# Patient Record
Sex: Male | Born: 1965 | Race: Black or African American | Hispanic: No | Smoking: Former smoker
Health system: Southern US, Community
[De-identification: ages and names within clinical notes are randomized; demographics above are authoritative.]

## PROBLEM LIST (undated history)

## (undated) DIAGNOSIS — L853 Xerosis cutis: Secondary | ICD-10-CM

## (undated) DIAGNOSIS — I10 Essential (primary) hypertension: Secondary | ICD-10-CM

## (undated) DIAGNOSIS — M1990 Unspecified osteoarthritis, unspecified site: Secondary | ICD-10-CM

## (undated) DIAGNOSIS — M5430 Sciatica, unspecified side: Secondary | ICD-10-CM

## (undated) HISTORY — PX: OTHER SURGICAL HISTORY: SHX169

## (undated) HISTORY — PX: NO PAST SURGERIES: SHX2092

---

## 2000-08-08 ENCOUNTER — Encounter: Admission: RE | Admit: 2000-08-08 | Discharge: 2000-11-06 | Payer: Self-pay | Admitting: Emergency Medicine

## 2001-03-04 ENCOUNTER — Ambulatory Visit (HOSPITAL_BASED_OUTPATIENT_CLINIC_OR_DEPARTMENT_OTHER): Admission: RE | Admit: 2001-03-04 | Discharge: 2001-03-04 | Payer: Self-pay | Admitting: Pulmonary Disease

## 2007-09-07 ENCOUNTER — Emergency Department (HOSPITAL_COMMUNITY): Admission: EM | Admit: 2007-09-07 | Discharge: 2007-09-07 | Payer: Self-pay | Admitting: Emergency Medicine

## 2007-09-07 IMAGING — CR DG CHEST 1V PORT
1 series · 1 of 1 positions shown · non-contrast
Comparison: none

CLINICAL DATA: Syncope.  Smoking history.  
 PORTABLE CHEST- 1 VIEW ([SS] hours):
 The lungs are clear.  The heart is within normal limits and size.  No bony abnormality is seen.

[view not recorded]
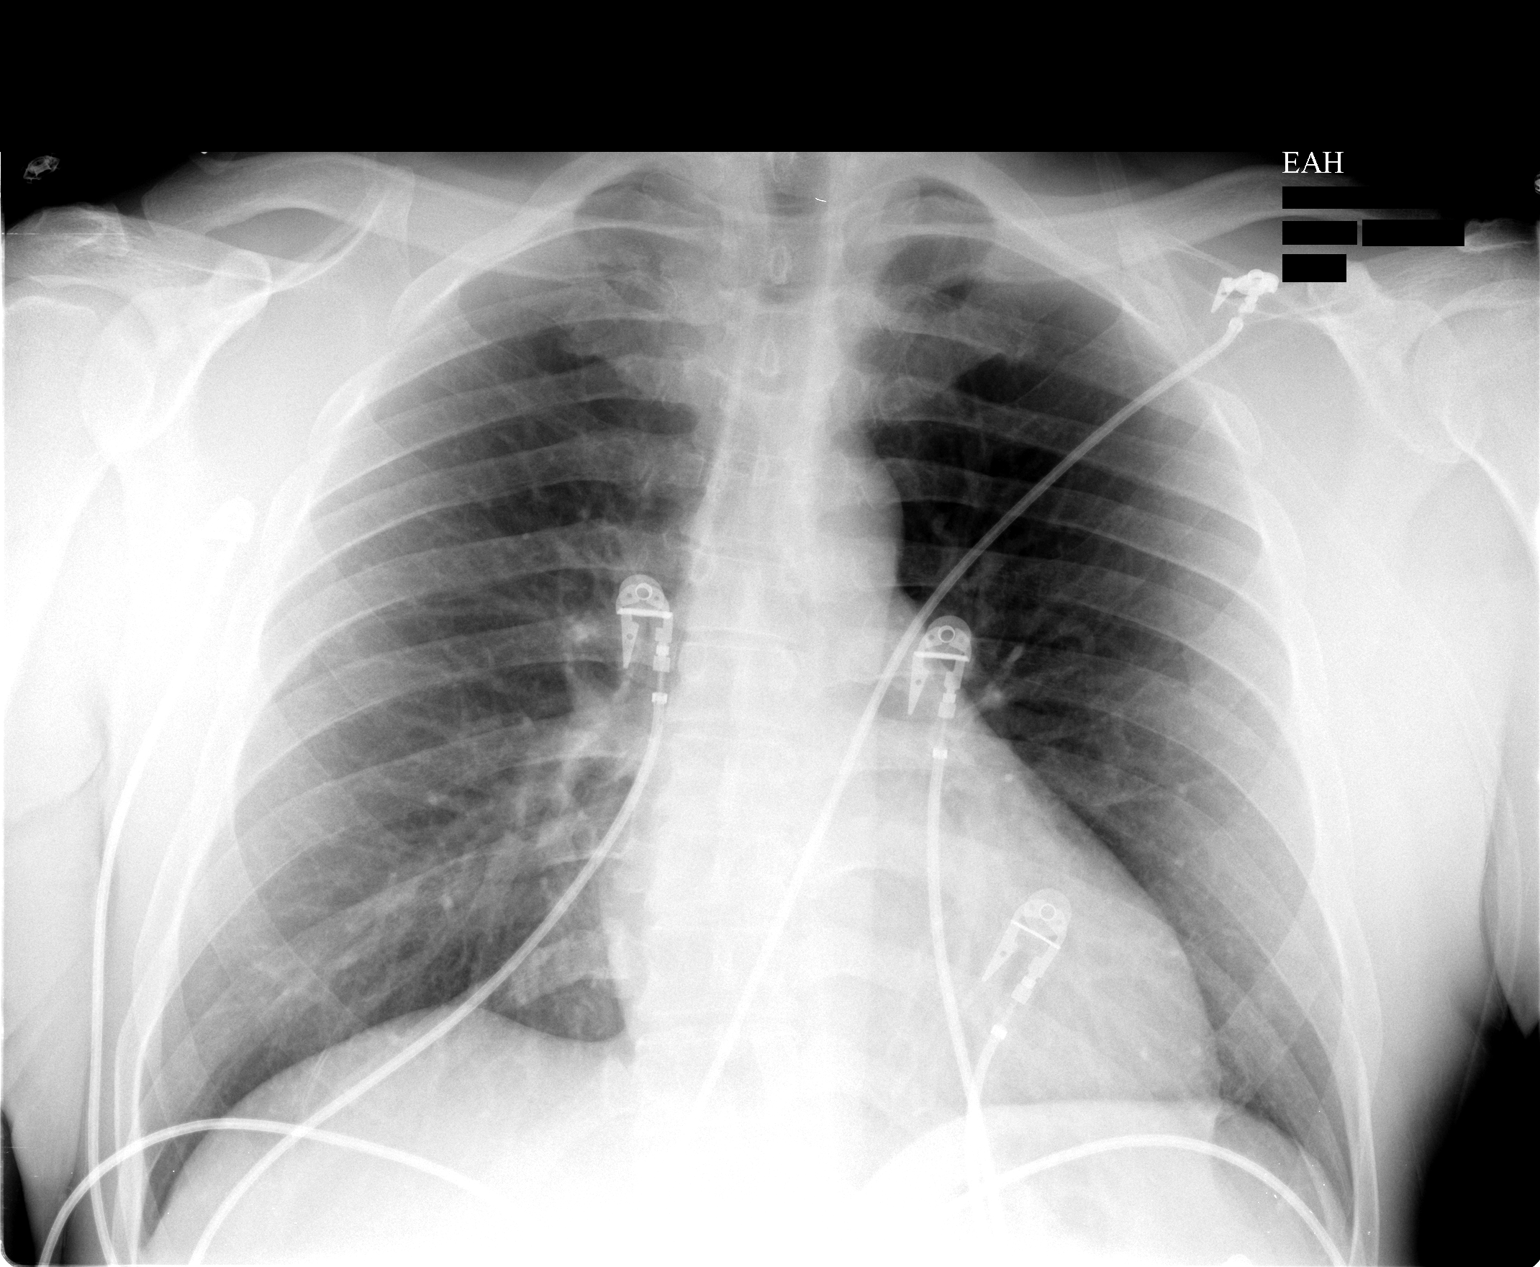

[1 of 1 positions shown; findings below may reference images not displayed]

IMPRESSION: No active lung disease.

## 2011-10-06 LAB — DIFFERENTIAL
Eosinophils Absolute: 0.1
Eosinophils Relative: 1
Lymphocytes Relative: 15
Lymphs Abs: 1.6
Monocytes Relative: 5
Neutrophils Relative %: 78 — ABNORMAL HIGH

## 2011-10-06 LAB — COMPREHENSIVE METABOLIC PANEL
Albumin: 3.6
Alkaline Phosphatase: 44
BUN: 11
CO2: 29
Chloride: 106
Creatinine, Ser: 0.88
Glucose, Bld: 86
Potassium: 4.3

## 2011-10-06 LAB — CBC
HCT: 41.3
Hemoglobin: 13.5
MCV: 85.5
Platelets: 173
RBC: 4.82
WBC: 11 — ABNORMAL HIGH

## 2011-10-06 LAB — POCT CARDIAC MARKERS
CKMB, poc: <1 — ABNORMAL LOW
CKMB, poc: <1 — ABNORMAL LOW
Myoglobin, poc: 78.4
Troponin i, poc: <0.05

## 2011-10-06 LAB — URINALYSIS, ROUTINE W REFLEX MICROSCOPIC
Glucose, UA: NEGATIVE
Ketones, ur: NEGATIVE
Specific Gravity, Urine: 1.02
pH: 7

## 2020-03-30 ENCOUNTER — Other Ambulatory Visit: Payer: Self-pay | Admitting: Surgery

## 2020-03-30 ENCOUNTER — Ambulatory Visit: Payer: Self-pay | Admitting: Surgery

## 2020-03-30 DIAGNOSIS — M7989 Other specified soft tissue disorders: Secondary | ICD-10-CM | POA: Insufficient documentation

## 2020-03-30 NOTE — H&P (Signed)
Meade Maw Documented: 03/30/2020 8:48 AM Location: [facility] Patient #: [phone] DOB: 03/20/1966 Single / Language: Cleophus Molt / Race: Black or African American Male  History of Present Illness Adin Hector MD; 03/30/2020 9:27 AM) The patient is a 54 year old male who presents with a soft tissue mass. Note for "Soft tissue mass": ` ` ` Patient sent for surgical consultation at the request of Dr Nancy Fetter  Chief Complaint: Left shoulder mass ` ` The patient is a pleasant active male who has had a mass over his left shoulder for many decades. However he's notices Larger. Some discomfort when he uses a shoulder. Does light duty activity. Sometimes moderate. Because it had gotten larger concerned him and he wished to be evaluated. Primary care physician recommended surgical consultation. Patient is obese but active. Denies any lumps or bumps elsewhere. He does smoke about a pack of cigarettes a day for the past 25 years. No by diabetic issues. He can walk a half hour without difficulty. No heart issues.  (Review of systems as stated in this history (HPI) or in the review of systems. Otherwise all other 12 point ROS are negative) ` ` `  This patient encounter took 25 minutes today to perform the following: obtain history, perform exam, review outside records, interpret tests & imaging, counsel the patient on their diagnosis; and, document this encounter, including findings & plan in the electronic health record (EHR).   Past Surgical History Emeline Gins, [place]; 03/30/2020 8:48 AM) No pertinent past surgical history  Diagnostic Studies History Emeline Gins, [place]; 03/30/2020 8:48 AM) Colonoscopy 1-5 years ago  Allergies Emeline Gins, [name]; 03/30/2020 8:49 AM) No Known Drug Allergies [03/30/2020]: Allergies Reconciled  Medication History Emeline Gins, [name]; 03/30/2020 8:50 AM) Lisinopril-hydroCHLOROthiazide (20-12.5MG  Tablet, Oral) Active. Meloxicam  (15MG  Tablet, Oral) Active. amLODIPine Besylate (5MG  Tablet, Oral) Active. Medications Reconciled  Social History Emeline Gins, [place]; 03/30/2020 8:48 AM) Alcohol use Occasional alcohol use. Caffeine use Carbonated beverages, Coffee, Tea. No drug use Tobacco use Current every day smoker.  Family History Emeline Gins, [place]; 03/30/2020 8:48 AM) Arthritis Mother, Sister. Diabetes Mellitus Brother, Mother. Hypertension Brother, Mother. Respiratory Condition Father.  Other Problems Emeline Gins, [name]; 03/30/2020 8:48 AM) Arthritis High blood pressure Hypercholesterolemia     Review of Systems Emeline Gins [name]; 03/30/2020 8:48 AM) General Not Present- Appetite Loss, Chills, Fatigue, Fever, Night Sweats, Weight Gain and Weight Loss. Skin Present- Dryness. Not Present- Change in Wart/Mole, Hives, Jaundice, New Lesions, Non-Healing Wounds, Rash and Ulcer. HEENT Present- Seasonal Allergies and Wears glasses/contact lenses. Not Present- Earache, Hearing Loss, Hoarseness, Nose Bleed, Oral Ulcers, Ringing in the Ears, Sinus Pain, Sore Throat, Visual Disturbances and Yellow Eyes. Respiratory Present- Snoring. Not Present- Bloody sputum, Chronic Cough, Difficulty Breathing and Wheezing. Cardiovascular Not Present- Chest Pain, Difficulty Breathing Lying Down, Leg Cramps, Palpitations, Rapid Heart Rate, Shortness of Breath and Swelling of Extremities. Gastrointestinal Not Present- Abdominal Pain, Bloating, Bloody Stool, Change in Bowel Habits, Chronic diarrhea, Constipation, Difficulty Swallowing, Excessive gas, Gets full quickly at meals, Hemorrhoids, Indigestion, Nausea, Rectal Pain and Vomiting. Male Genitourinary Not Present- Blood in Urine, Change in Urinary Stream, Frequency, Impotence, Nocturia, Painful Urination, Urgency and Urine Leakage. Musculoskeletal Present- Joint Pain and Joint Stiffness. Not Present- Back Pain, Muscle Pain, Muscle Weakness and Swelling of  Extremities. Neurological Not Present- Decreased Memory, Fainting, Headaches, Numbness, Seizures, Tingling, Tremor, Trouble walking and Weakness. Psychiatric Not Present- Anxiety, Bipolar, Change in Sleep Pattern, Depression, Fearful and Frequent crying. Endocrine Not Present- Cold Intolerance,  Excessive Hunger, Hair Changes, Heat Intolerance, Hot flashes and New Diabetes. Hematology Not Present- Blood Thinners, Easy Bruising, Excessive bleeding, Gland problems, HIV and Persistent Infections.  Vitals [name] [name]; 03/30/2020 8:49 AM) 03/30/2020 8:49 AM Weight: 244.6 lb Height: 68in Body Surface Area: 2.23 m Body Mass Index: 37.19 kg/m  Temp.: 98.72F  Pulse: 99 (Regular)  BP: 156/100 (Sitting, Left Arm, Standard)        Physical Exam Ardeth Sportsman MD; 03/30/2020 9:26 AM)  General Mental Status-Alert. General Appearance-Not in acute distress, Not Sickly. Orientation-Oriented X3. Hydration-Well hydrated. Voice-Normal.  Integumentary Global Assessment Upon inspection and palpation of skin surfaces of the - Axillae: non-tender, no inflammation or ulceration, no drainage. and Distribution of scalp and body hair is normal. General Characteristics Temperature - normal warmth is noted.  Head and Neck Head-normocephalic, atraumatic with no lesions or palpable masses. Face Global Assessment - atraumatic, no absence of expression. Neck Global Assessment - no abnormal movements, no bruit auscultated on the right, no bruit auscultated on the left, no decreased range of motion, non-tender. Trachea-midline. Thyroid Gland Characteristics - non-tender.  Eye Eyeball - Left-Extraocular movements intact, No Nystagmus - Left. Eyeball - Right-Extraocular movements intact, No Nystagmus - Right. Cornea - Left-No Hazy - Left. Cornea - Right-No Hazy - Right. Sclera/Conjunctiva - Left-No scleral icterus, No Discharge - Left. Sclera/Conjunctiva -  Right-No scleral icterus, No Discharge - Right. Pupil - Left-Direct reaction to light normal. Pupil - Right-Direct reaction to light normal.  ENMT Ears Pinna - Left - no drainage observed, no generalized tenderness observed. Pinna - Right - no drainage observed, no generalized tenderness observed. Nose and Sinuses External Inspection of the Nose - no destructive lesion observed. Inspection of the nares - Left - quiet respiration. Inspection of the nares - Right - quiet respiration. Mouth and Throat Lips - Upper Lip - no fissures observed, no pallor noted. Lower Lip - no fissures observed, no pallor noted. Nasopharynx - no discharge present. Oral Cavity/Oropharynx - Tongue - no dryness observed. Oral Mucosa - no cyanosis observed. Hypopharynx - no evidence of airway distress observed.  Chest and Lung Exam Inspection Movements - Normal and Symmetrical. Accessory muscles - No use of accessory muscles in breathing. Palpation Palpation of the chest reveals - Non-tender. Auscultation Breath sounds - Normal and Clear.  Cardiovascular Auscultation Rhythm - Regular. Murmurs & Other Heart Sounds - Auscultation of the heart reveals - No Murmurs and No Systolic Clicks.  Abdomen Inspection Inspection of the abdomen reveals - No Visible peristalsis and No Abnormal pulsations. Umbilicus - No Bleeding, No Urine drainage. Palpation/Percussion Palpation and Percussion of the abdomen reveal - Soft, Non Tender, No Rebound tenderness, No Rigidity (guarding) and No Cutaneous hyperesthesia. Note: Abdomen obese with soft. Small umbilical hernia through stalk only. Nontender. Not distended. No incisional hernias. No guarding.  Male Genitourinary Sexual Maturity Tanner 5 - Adult hair pattern and Adult penile size and shape. Note: No inguinal hernias. Normal external genitalia. Epididymi, testes, and spermatic cords normal without any masses.  Peripheral Vascular Upper Extremity Inspection -  Left - No Cyanotic nailbeds - Left, Not Ischemic. Inspection - Right - No Cyanotic nailbeds - Right, Not Ischemic.  Neurologic Neurologic evaluation reveals -normal attention span and ability to concentrate, able to name objects and repeat phrases. Appropriate fund of knowledge , normal sensation and normal coordination. Mental Status Affect - not angry, not paranoid. Cranial Nerves-Normal Bilaterally. Gait-Normal.  Neuropsychiatric Mental status exam performed with findings of-able to articulate well with normal  speech/language, rate, volume and coherence, thought content normal with ability to perform basic computations and apply abstract reasoning and no evidence of hallucinations, delusions, obsessions or homicidal/suicidal ideation.  Musculoskeletal Global Assessment Spine, Ribs and Pelvis - no instability, subluxation or laxity. Right Upper Extremity - no instability, subluxation or laxity. Note: Ellipsoid mass on in her left shoulder at apex overlying trapezius. 5.5 cm in largest diameter. Some vascular changes. Mildly fixed but mostly mobile and smooth and soft.  Lymphatic Head & Neck  General Head & Neck Lymphatics: Bilateral - Description - No Localized lymphadenopathy. Axillary  General Axillary Region: Bilateral - Description - No Localized lymphadenopathy. Femoral & Inguinal  Generalized Femoral & Inguinal Lymphatics: Left - Description - No Localized lymphadenopathy. Right - Description - No Localized lymphadenopathy.    Assessment & Plan Ardeth Sportsman MD; 03/30/2020 9:27 AM)  MASS OF SOFT TISSUE OF UPPER EXTREMITY (M79.89) Impression: Mass of left superior shoulder muscle along the trapezius growing in size. Seems ellipsoid and relatively soft. Most likely lipoma but some vascular changes maybe one or if it's more of a angiolipoma.  Because it has gotten larger, think it is reasonable to remove. It seems rather well circumscribed, solid don't think an MRI  is needed at this time. He is interested in proceeding. Might need a drain postop. We will see.  I strongly encouraged him to quit smoking.  Current Plans You are being scheduled for surgery- Our schedulers will call you.  You should hear from our office's scheduling department within 5 working days about the location, date, and time of surgery. We try to make accommodations for patient's preferences in scheduling surgery, but sometimes the OR schedule or the surgeon's schedule prevents [place] from making those accommodations.  If you have not heard from our office [phone]) in 5 working days, call the office and ask for your surgeon's nurse.  If you have other questions about your diagnosis, plan, or surgery, call the office and ask for your surgeon's nurse.  The pathophysiology of skin & subcutaneous masses was discussed. Natural history risks without surgery were discussed. I recommended surgery to remove the mass. I explained the technique of removal with use of local anesthesia & possible need for more aggressive sedation/anesthesia for patient comfort.  Risks such as bleeding, infection, wound breakdown, heart attack, death, and other risks were discussed. I noted a good likelihood this will help address the problem. Possibility that this will not correct all symptoms was explained. Possibility of regrowth/recurrence of the mass was discussed. We will work to minimize complications. Questions were answered. The patient expresses understanding & wishes to proceed with surgery.   TOBACCO ABUSE (Z72.0) Impression: STOP SMOKING!  We talked to the patient about the dangers of smoking. We stressed that tobacco use dramatically increases the risk of peri-operative complications such as infection, tissue necrosis leaving to problems with incision/wound and organ healing, hernia, chronic pain, heart attack, stroke, DVT, pulmonary embolism, and death. We noted there are programs in our  community to help stop smoking. Information was available.  Current Plans Pt Education - [facility] STOP SMOKING!  OBESITY (BMI 30-39.9) (E66.9)   UMBILICAL HERNIA WITHOUT OBSTRUCTION AND WITHOUT GANGRENE (K42.9) Impression: Very small umbilical hernia. Asymptomatic. Most likely present there for a long period time. Would not fix unless bothers or changes  Ardeth Sportsman, MD, FACS, MASCRS Gastrointestinal and Minimally Invasive Surgery  [facility] [address] [place], [place] [address] [phone] Main / Paging [phone])  [phone] Fax

## 2020-06-08 ENCOUNTER — Other Ambulatory Visit (HOSPITAL_COMMUNITY)
Admission: RE | Admit: 2020-06-08 | Discharge: 2020-06-08 | Disposition: A | Discharge status: Home / Self Care | Payer: HRSA Program | Source: Ambulatory Visit | Attending: Surgery | Admitting: Surgery

## 2020-06-08 ENCOUNTER — Encounter (HOSPITAL_BASED_OUTPATIENT_CLINIC_OR_DEPARTMENT_OTHER): Payer: Self-pay | Admitting: Surgery

## 2020-06-08 ENCOUNTER — Other Ambulatory Visit: Payer: Self-pay

## 2020-06-08 DIAGNOSIS — Z01812 Encounter for preprocedural laboratory examination: Secondary | ICD-10-CM | POA: Insufficient documentation

## 2020-06-08 DIAGNOSIS — Z20822 Contact with and (suspected) exposure to covid-19: Secondary | ICD-10-CM | POA: Diagnosis not present

## 2020-06-08 LAB — SARS CORONAVIRUS 2 (TAT 6-24 HRS): SARS Coronavirus 2: NEGATIVE

## 2020-06-08 NOTE — Progress Notes (Signed)
Spoke w/ via phone for pre-op interview---pt Lab needs dos----I stat 8, ekg              COVID test ------05-08-2020 at 1500 Arrive at -------530 am 06-11-2020 NPO after ------midnight Medications to take morning of surgery -----none Diabetic medication -----n/a Patient Special Instructions -----n/a Pre-Op special Istructions -----n/a Patient verbalized understanding of instructions that were given at this phone interview. Patient denies shortness of breath, chest pain, fever, cough a this phone interview.

## 2020-06-10 MED ORDER — BUPIVACAINE LIPOSOME 1.3 % IJ SUSP: 20.0000 mL | Freq: Once | INTRAMUSCULAR | Status: DC

## 2020-06-10 NOTE — Anesthesia Preprocedure Evaluation (Addendum)
Anesthesia Evaluation  Patient identified by MRN, date of birth, ID band Patient awake    Reviewed: Allergy & Precautions, NPO status   Airway Mallampati: II  TM Distance: >3 FB     Dental   Pulmonary Current Smoker and Patient abstained from smoking.,    breath sounds clear to auscultation       Cardiovascular hypertension,  Rhythm:Regular Rate:Normal     Neuro/Psych    GI/Hepatic negative GI ROS, Neg liver ROS,   Endo/Other    Renal/GU negative Renal ROS     Musculoskeletal  (+) Arthritis ,   Abdominal   Peds  Hematology   Anesthesia Other Findings   Reproductive/Obstetrics                            Anesthesia Physical Anesthesia Plan  ASA: III  Anesthesia Plan: MAC   Post-op Pain Management:    Induction: Intravenous  PONV Risk Score and Plan: 2 and Ondansetron, Dexamethasone and Midazolam  Airway Management Planned: Nasal Cannula  Additional Equipment:   Intra-op Plan:   Post-operative Plan:   Informed Consent:     Dental advisory given  Plan Discussed with: CRNA and Anesthesiologist  Anesthesia Plan Comments:        Anesthesia Quick Evaluation

## 2020-06-11 ENCOUNTER — Other Ambulatory Visit: Payer: Self-pay

## 2020-06-11 ENCOUNTER — Ambulatory Visit (HOSPITAL_BASED_OUTPATIENT_CLINIC_OR_DEPARTMENT_OTHER): Payer: Self-pay | Admitting: Anesthesiology

## 2020-06-11 ENCOUNTER — Ambulatory Visit (HOSPITAL_BASED_OUTPATIENT_CLINIC_OR_DEPARTMENT_OTHER)
Admission: RE | Admit: 2020-06-11 | Discharge: 2020-06-11 | Disposition: A | Discharge status: Home / Self Care | Payer: Self-pay | Attending: Surgery | Admitting: Surgery

## 2020-06-11 ENCOUNTER — Encounter (HOSPITAL_BASED_OUTPATIENT_CLINIC_OR_DEPARTMENT_OTHER)
Admission: RE | Disposition: A | Discharge status: Home / Self Care | Payer: Self-pay | Source: Home / Self Care | Attending: Surgery

## 2020-06-11 ENCOUNTER — Encounter (HOSPITAL_BASED_OUTPATIENT_CLINIC_OR_DEPARTMENT_OTHER): Payer: Self-pay | Admitting: Surgery

## 2020-06-11 DIAGNOSIS — F17210 Nicotine dependence, cigarettes, uncomplicated: Secondary | ICD-10-CM | POA: Insufficient documentation

## 2020-06-11 DIAGNOSIS — M1990 Unspecified osteoarthritis, unspecified site: Secondary | ICD-10-CM | POA: Insufficient documentation

## 2020-06-11 DIAGNOSIS — Z6837 Body mass index (BMI) 37.0-37.9, adult: Secondary | ICD-10-CM | POA: Insufficient documentation

## 2020-06-11 DIAGNOSIS — Z833 Family history of diabetes mellitus: Secondary | ICD-10-CM | POA: Insufficient documentation

## 2020-06-11 DIAGNOSIS — E7800 Pure hypercholesterolemia, unspecified: Secondary | ICD-10-CM | POA: Insufficient documentation

## 2020-06-11 DIAGNOSIS — Z79899 Other long term (current) drug therapy: Secondary | ICD-10-CM | POA: Insufficient documentation

## 2020-06-11 DIAGNOSIS — Z8249 Family history of ischemic heart disease and other diseases of the circulatory system: Secondary | ICD-10-CM | POA: Insufficient documentation

## 2020-06-11 DIAGNOSIS — E669 Obesity, unspecified: Secondary | ICD-10-CM | POA: Insufficient documentation

## 2020-06-11 DIAGNOSIS — R2232 Localized swelling, mass and lump, left upper limb: Secondary | ICD-10-CM | POA: Insufficient documentation

## 2020-06-11 HISTORY — DX: Xerosis cutis: L85.3

## 2020-06-11 HISTORY — DX: Essential (primary) hypertension: I10

## 2020-06-11 HISTORY — DX: Unspecified osteoarthritis, unspecified site: M19.90

## 2020-06-11 HISTORY — DX: Sciatica, unspecified side: M54.30

## 2020-06-11 HISTORY — PX: MASS EXCISION: SHX2000

## 2020-06-11 LAB — POCT I-STAT, CHEM 8
BUN: 13 mg/dL (ref 6–20)
Calcium, Ion: 1.26 mmol/L (ref 1.15–1.40)
Chloride: 104 mmol/L (ref 98–111)
Creatinine, Ser: 1.1 mg/dL (ref 0.61–1.24)
Glucose, Bld: 102 mg/dL — ABNORMAL HIGH (ref 70–99)
HCT: 43 % (ref 39.0–52.0)
Hemoglobin: 14.6 g/dL (ref 13.0–17.0)
Potassium: 4 mmol/L (ref 3.5–5.1)
Sodium: 141 mmol/L (ref 135–145)
TCO2: 24 mmol/L (ref 22–32)

## 2020-06-11 SURGERY — EXCISION MASS
Anesthesia: Monitor Anesthesia Care | Laterality: Left

## 2020-06-11 MED ORDER — ACETAMINOPHEN 500 MG PO TABS
1000.0000 mg | ORAL_TABLET | ORAL | Status: AC
  Administered 2020-06-11: 1000 mg via ORAL

## 2020-06-11 MED ORDER — PROPOFOL 10 MG/ML IV BOLUS
INTRAVENOUS | Status: AC
  Filled 2020-06-11: qty 20

## 2020-06-11 MED ORDER — CEFAZOLIN SODIUM-DEXTROSE 2-4 GM/100ML-% IV SOLN
2.0000 g | INTRAVENOUS | Status: AC
  Administered 2020-06-11: 2 g via INTRAVENOUS

## 2020-06-11 MED ORDER — BUPIVACAINE LIPOSOME 1.3 % IJ SUSP
INTRAMUSCULAR | Status: DC | PRN
  Administered 2020-06-11: 20 mL

## 2020-06-11 MED ORDER — PROPOFOL 500 MG/50ML IV EMUL
INTRAVENOUS | Status: AC
  Filled 2020-06-11: qty 50

## 2020-06-11 MED ORDER — MIDAZOLAM HCL 5 MG/5ML IJ SOLN
INTRAMUSCULAR | Status: DC | PRN
  Administered 2020-06-11: 2 mg via INTRAVENOUS

## 2020-06-11 MED ORDER — HYDROMORPHONE HCL 1 MG/ML IJ SOLN
INTRAMUSCULAR | Status: AC
  Filled 2020-06-11: qty 1

## 2020-06-11 MED ORDER — CEFAZOLIN SODIUM-DEXTROSE 2-4 GM/100ML-% IV SOLN
INTRAVENOUS | Status: AC
  Filled 2020-06-11: qty 100

## 2020-06-11 MED ORDER — PROPOFOL 500 MG/50ML IV EMUL
INTRAVENOUS | Status: DC | PRN
  Administered 2020-06-11: 75 ug/kg/min via INTRAVENOUS

## 2020-06-11 MED ORDER — CEFTRIAXONE SODIUM 2 G IJ SOLR
INTRAMUSCULAR | Status: AC
  Filled 2020-06-11: qty 20

## 2020-06-11 MED ORDER — CHLORHEXIDINE GLUCONATE CLOTH 2 % EX PADS: 6.0000 | MEDICATED_PAD | Freq: Once | CUTANEOUS | Status: DC

## 2020-06-11 MED ORDER — CELECOXIB 200 MG PO CAPS
ORAL_CAPSULE | ORAL | Status: AC
  Filled 2020-06-11: qty 2

## 2020-06-11 MED ORDER — METRONIDAZOLE IN NACL 5-0.79 MG/ML-% IV SOLN
500.0000 mg | INTRAVENOUS | Status: AC
  Administered 2020-06-11: 500 mg via INTRAVENOUS

## 2020-06-11 MED ORDER — MIDAZOLAM HCL 2 MG/2ML IJ SOLN
INTRAMUSCULAR | Status: AC
  Filled 2020-06-11: qty 2

## 2020-06-11 MED ORDER — BUPIVACAINE-EPINEPHRINE 0.25% -1:200000 IJ SOLN
INTRAMUSCULAR | Status: DC | PRN
  Administered 2020-06-11: 30 mL

## 2020-06-11 MED ORDER — GABAPENTIN 300 MG PO CAPS
300.0000 mg | ORAL_CAPSULE | ORAL | Status: AC
  Administered 2020-06-11: 300 mg via ORAL

## 2020-06-11 MED ORDER — ONDANSETRON HCL 4 MG/2ML IJ SOLN
INTRAMUSCULAR | Status: AC
  Filled 2020-06-11: qty 2

## 2020-06-11 MED ORDER — GABAPENTIN 300 MG PO CAPS
ORAL_CAPSULE | ORAL | Status: AC
  Filled 2020-06-11: qty 1

## 2020-06-11 MED ORDER — SODIUM CHLORIDE 0.9 % IV SOLN: INTRAVENOUS | Status: DC

## 2020-06-11 MED ORDER — TRAMADOL HCL 50 MG PO TABS: 50.0000 mg | ORAL_TABLET | Freq: Four times a day (QID) | ORAL | 0 refills | Status: DC | PRN

## 2020-06-11 MED ORDER — ACETAMINOPHEN 500 MG PO TABS
ORAL_TABLET | ORAL | Status: AC
  Filled 2020-06-11: qty 2

## 2020-06-11 MED ORDER — SODIUM CHLORIDE 0.9 % IV SOLN
INTRAVENOUS | Status: AC
  Filled 2020-06-11: qty 100

## 2020-06-11 MED ORDER — METRONIDAZOLE IN NACL 5-0.79 MG/ML-% IV SOLN
INTRAVENOUS | Status: AC
  Filled 2020-06-11: qty 100

## 2020-06-11 MED ORDER — CELECOXIB 200 MG PO CAPS
400.0000 mg | ORAL_CAPSULE | ORAL | Status: AC
  Administered 2020-06-11: 400 mg via ORAL

## 2020-06-11 MED ORDER — HYDROMORPHONE HCL 1 MG/ML IJ SOLN
0.2500 mg | INTRAMUSCULAR | Status: DC | PRN
  Administered 2020-06-11: 0.5 mg via INTRAVENOUS

## 2020-06-11 MED ORDER — SODIUM CHLORIDE 0.9 % IV SOLN
2.0000 g | INTRAVENOUS | Status: AC
  Administered 2020-06-11: 2 g via INTRAVENOUS

## 2020-06-11 MED ORDER — FENTANYL CITRATE (PF) 100 MCG/2ML IJ SOLN
INTRAMUSCULAR | Status: DC | PRN
  Administered 2020-06-11: 100 ug via INTRAVENOUS

## 2020-06-11 MED ORDER — LIDOCAINE 2% (20 MG/ML) 5 ML SYRINGE
INTRAMUSCULAR | Status: AC
  Filled 2020-06-11: qty 5

## 2020-06-11 MED ORDER — FENTANYL CITRATE (PF) 100 MCG/2ML IJ SOLN
INTRAMUSCULAR | Status: AC
  Filled 2020-06-11: qty 2

## 2020-06-11 MED ORDER — DEXAMETHASONE SODIUM PHOSPHATE 10 MG/ML IJ SOLN
INTRAMUSCULAR | Status: AC
  Filled 2020-06-11: qty 1

## 2020-06-11 SURGICAL SUPPLY — 57 items
APPLIER CLIP 11 MED OPEN (CLIP)
BENZOIN TINCTURE PRP APPL 2/3 (GAUZE/BANDAGES/DRESSINGS) ×2 IMPLANT
BLADE CLIPPER SENSICLIP SURGIC (BLADE) IMPLANT
BLADE HEX COATED 2.75 (ELECTRODE) ×2 IMPLANT
BLADE SURG 10 STRL SS (BLADE) ×2 IMPLANT
BLADE SURG 15 STRL LF DISP TIS (BLADE) ×1 IMPLANT
BLADE SURG 15 STRL SS (BLADE) ×2
BNDG GAUZE ELAST 4 BULKY (GAUZE/BANDAGES/DRESSINGS) IMPLANT
BRIEF STRETCH FOR OB PAD LRG (UNDERPADS AND DIAPERS) IMPLANT
CANISTER SUCT 1200ML W/VALVE (MISCELLANEOUS) IMPLANT
CHLORAPREP W/TINT 26 (MISCELLANEOUS) ×2 IMPLANT
CLIP APPLIE 11 MED OPEN (CLIP) IMPLANT
COVER BACK TABLE 60X90IN (DRAPES) ×2 IMPLANT
COVER MAYO STAND STRL (DRAPES) ×2 IMPLANT
COVER WAND RF STERILE (DRAPES) ×2 IMPLANT
DERMABOND ADVANCED (GAUZE/BANDAGES/DRESSINGS) ×1
DERMABOND ADVANCED .7 DNX12 (GAUZE/BANDAGES/DRESSINGS) ×1 IMPLANT
DRAIN CHANNEL 15F RND FF 3/16 (WOUND CARE) ×2 IMPLANT
DRAPE LAPAROTOMY 100X72 PEDS (DRAPES) ×2 IMPLANT
DRAPE ORTHO SPLIT 77X108 STRL (DRAPES)
DRAPE SURG ORHT 6 SPLT 77X108 (DRAPES) IMPLANT
DRAPE UTILITY XL STRL (DRAPES) ×2 IMPLANT
DRSG PAD ABDOMINAL 8X10 ST (GAUZE/BANDAGES/DRESSINGS) IMPLANT
DRSG TEGADERM 4X4.75 (GAUZE/BANDAGES/DRESSINGS) ×2 IMPLANT
ELECT REM PT RETURN 9FT ADLT (ELECTROSURGICAL) ×2
ELECTRODE REM PT RTRN 9FT ADLT (ELECTROSURGICAL) ×1 IMPLANT
EVACUATOR SILICONE 100CC (DRAIN) ×2 IMPLANT
GAUZE SPONGE 4X4 12PLY STRL (GAUZE/BANDAGES/DRESSINGS) ×2 IMPLANT
GLOVE ECLIPSE 8.0 STRL XLNG CF (GLOVE) ×2 IMPLANT
GLOVE INDICATOR 8.0 STRL GRN (GLOVE) ×2 IMPLANT
GOWN STRL REUS W/TWL LRG LVL3 (GOWN DISPOSABLE) ×2 IMPLANT
KIT TURNOVER CYSTO (KITS) ×2 IMPLANT
NEEDLE HYPO 22GX1.5 SAFETY (NEEDLE) ×2 IMPLANT
NS IRRIG 500ML POUR BTL (IV SOLUTION) ×2 IMPLANT
PENCIL BUTTON HOLSTER BLD 10FT (ELECTRODE) ×2 IMPLANT
SET BASIN DAY SURGERY F.S. (CUSTOM PROCEDURE TRAY) ×2 IMPLANT
SPONGE LAP 18X18 RF (DISPOSABLE) ×2 IMPLANT
SPONGE LAP 4X18 RFD (DISPOSABLE) IMPLANT
STOCKINETTE 6  STRL (DRAPES)
STOCKINETTE 6 STRL (DRAPES) IMPLANT
STRIP CLOSURE SKIN 1/2X4 (GAUZE/BANDAGES/DRESSINGS) ×2 IMPLANT
SUT MNCRL AB 4-0 PS2 18 (SUTURE) ×2 IMPLANT
SUT PROLENE 2 0 CT2 30 (SUTURE) ×2 IMPLANT
SUT VIC AB 2-0 SH 18 (SUTURE) ×2 IMPLANT
SUT VIC AB 3-0 SH 18 (SUTURE) IMPLANT
SUT VIC AB 3-0 SH 27 (SUTURE)
SUT VIC AB 3-0 SH 27X BRD (SUTURE) IMPLANT
SUT VICRYL 2 0 18  UND BR (SUTURE)
SUT VICRYL 2 0 18 UND BR (SUTURE) IMPLANT
SYR 20ML LL LF (SYRINGE) ×2 IMPLANT
SYR BULB IRRIG 60ML STRL (SYRINGE) ×2 IMPLANT
SYR CONTROL 10ML LL (SYRINGE) IMPLANT
TOWEL OR 17X26 10 PK STRL BLUE (TOWEL DISPOSABLE) ×4 IMPLANT
TRAY DSU PREP LF (CUSTOM PROCEDURE TRAY) IMPLANT
TUBE CONNECTING 12X1/4 (SUCTIONS) ×2 IMPLANT
WATER STERILE IRR 500ML POUR (IV SOLUTION) ×2 IMPLANT
YANKAUER SUCT BULB TIP NO VENT (SUCTIONS) ×2 IMPLANT

## 2020-06-11 NOTE — Anesthesia Procedure Notes (Signed)
Performed by: Grantley Savage D, CRNA Oxygen Delivery Method: Simple face mask       

## 2020-06-11 NOTE — Interval H&P Note (Signed)
History and Physical Interval Note:  06/11/2020 6:58 AM  Alan Coffey  has presented today for surgery, with the diagnosis of LEFT SHOULDER SUBCUTANEOUS MASS, 5.5 cm x 5 cm.  The various methods of treatment have been discussed with the patient and family. After consideration of risks, benefits and other options for treatment, the patient has consented to  Procedure(s) with comments: REMOVAL OF LEFT SHOULDER SUBCUTANEOUS MASS (Left) - MAC VS GENERAL (ANESTHESIA CHOICE) as a surgical intervention.  The patient's history has been reviewed, patient examined, no change in status, stable for surgery.  I have reviewed the patient's chart and labs.  Questions were answered to the patient's satisfaction.     Adin Hector

## 2020-06-11 NOTE — Anesthesia Postprocedure Evaluation (Signed)
Anesthesia Post Note  Patient: Alan Coffey  Procedure(s) Performed: REMOVAL OF LEFT SHOULDER SUBCUTANEOUS MASS (Left )     Patient location during evaluation: PACU Anesthesia Type: MAC Level of consciousness: awake Pain management: pain level controlled Vital Signs Assessment: post-procedure vital signs reviewed and stable Respiratory status: spontaneous breathing Cardiovascular status: stable Postop Assessment: no apparent nausea or vomiting Anesthetic complications: no   No complications documented.  Last Vitals:  Vitals:   06/11/20 0600 06/11/20 0854  BP: (!) 169/109   Pulse: 89 73  Resp: 20 16  Temp: 36.9 C 36.6 C  SpO2: 96% 97%    Last Pain:  Vitals:   06/11/20 0854  TempSrc: Oral  PainSc:                  [name]

## 2020-06-11 NOTE — Anesthesia Postprocedure Evaluation (Signed)
Anesthesia Post Note ° °Patient: Alan Coffey ° °Procedure(s) Performed: REMOVAL OF LEFT SHOULDER SUBCUTANEOUS MASS (Left ) ° °  ° °Patient location during evaluation: PACU °Anesthesia Type: MAC °Level of consciousness: awake °Pain management: pain level controlled °Vital Signs Assessment: post-procedure vital signs reviewed and stable °Respiratory status: spontaneous breathing °Cardiovascular status: stable °Postop Assessment: no apparent nausea or vomiting °Anesthetic complications: no ° ° °No complications documented. ° °Last Vitals:  °Vitals:  ° 06/11/20 0600 06/11/20 0854  °BP: (!) 169/109   °Pulse: 89 73  °Resp: 20 16  °Temp: 36.9 °C 36.6 °C  °SpO2: 96% 97%  °  °Last Pain:  °Vitals:  ° 06/11/20 0854  °TempSrc: Oral  °PainSc:   ° ° °  °  °  °  °  °  ° °Alan Coffey ° ° ° ° °

## 2020-06-11 NOTE — Op Note (Signed)
06/11/2020  8:54 AM  PATIENT:  Alan Coffey  54 y.o. male  Patient Care Team: Sandi Mariscal, MD as PCP - General (Internal Medicine) Michael Boston, MD as Consulting Physician (General Surgery)  PRE-OPERATIVE DIAGNOSIS:  LEFT SHOULDER SUBCUTANEOUS MASS, 5.5 cm x 5 cm  POST-OPERATIVE DIAGNOSIS:  LEFT SHOULDER SUBCUTANEOUS CYSTIC MASS, 7 cm x 6 cm  PROCEDURE:  REMOVAL OF LEFT SHOULDER SUBCUTANEOUS MASS  SURGEON:  Adin Hector, MD  ASSISTANT: OR Staff   ANESTHESIA:   local and MAC  EBL:  No intake/output data recorded..  See anesthesia record  Delay start of Pharmacological VTE agent (>24hrs) due to surgical blood loss or risk of bleeding:  no  DRAINS: 15Fr Blake drain from shoulder to left anterior chest   SPECIMEN:  LEFT SHOULDER CYSTIC MASS  DISPOSITION OF SPECIMEN:  PATHOLOGY  COUNTS:  YES  PLAN OF CARE: Discharge to home after PACU  PATIENT DISPOSITION:  PACU - hemodynamically stable.  INDICATION: Pleasant patient with enlarging mass on supraclavicular left side coming onto shoulder.  Becoming uncomfortable and enlarging.  Smooth.  Suspicion for malignancy low.  I offered excision the operating room  The pathophysiology of skin & subcutaneous masses was discussed.  Natural history risks without surgery were discussed.  I recommended surgery to remove the mass.  I explained the technique of removal with use of local anesthesia & possible need for more aggressive sedation/anesthesia for patient comfort.    Risks such as bleeding, infection, wound breakdown, heart attack, death, and other risks were discussed.  I noted a good likelihood this will help address the problem.   Possibility that this will not correct all symptoms was explained. Possibility of regrowth/recurrence of the mass was discussed.  We will work to minimize complications. Questions were answered.  The patient expresses understanding & wishes to proceed with surgery.  OR FINDINGS: 7 x 6 cm turgid  subcutaneous mass with dense adhesions to the dermis.  Primarily containing serosanguineous cystic fluid.  Inflamed but not infected.  Adhered to chest wall fascia and onto trapezius.  Excised.  Layered closure with drain  CASE DATA:  Type of patient?: Elective [facility] Private Case  Status of Case? Elective Scheduled  Infection Present At Time Of Surgery (PATOS)?  NO  DESCRIPTION:     Informed consent was confirmed.  Patient underwent monitored deep sedation.  Supine with head up beachchair status.  Shoulder and chest on left side prepped and draped in a sterile fashion.  Surgical timeout confirmed our plan.  I did aggressive field block with 1/4% bupivacaine plain mixed with Exparel.  I made a incision over the center of the part of the mass obliquely along natural tension lines.  Mass was densely adherent to the dermis.  I used sharp dissection to help free it off the dermis.  I ended up decompressing it.  Contain very thin serosanguineous fluid.  Wall was rather inflamed.  I was able to gradually come around to the anterior chest wall fascia and free off the deeper margin come around.  Extremely dense the skin and it was quite stretched out so I ended up removing some excess skin in a biconcave fusiform fashion.  Specimen removed intact.  Hemostasis ensured.  Because the moderate size decided to leave a drain.  Tunneled to come out the anterior axillary line in his upper inner axilla.  Secured with Prolene suture.  I mobilized skin flaps onto the left chest wall and left back.  I closed it  with interrupted 2-0 Vicryl deep sutures as well as deep dermal sutures.  Running 4-0 Monocryl subcuticular suture.  Dermabond placed.  Sterile dressing applied.  Patient tolerated procedure well.  Should be able to go home.  Plan would be for him to come back in about a week to remove the drain if output remains low and then follow-up thereafter.  .I discussed operative findings, updated the patient's status,  discussed probable steps to recovery, and gave postoperative recommendations to the patient's daughter, [name].  Recommendations were made.  Questions were answered.  She expressed understanding & appreciation.   Adin Hector, M.D., F.A.C.S. Gastrointestinal and Minimally Invasive Surgery [facility] [address] [place], [place] [address] [phone] Main / Paging

## 2020-06-11 NOTE — H&P (Signed)
06/11/2020   Alan Coffey DOB: 07-Dec-1966  Patient Care Team: [name], MD as PCP - General (Internal Medicine) [name], MD as Consulting Physician (General Surgery)   Patient sent for surgical consultation at the request of Dr Alan Coffey  Chief Complaint: Left shoulder mass ` ` The patient is a pleasant active male who has had a mass over his left shoulder for many decades. However he's notices Larger. Some discomfort when he uses a shoulder. Does light duty activity. Sometimes moderate. Because it had gotten larger concerned him and he wished to be evaluated. Primary care physician recommended surgical consultation. Patient is obese but active. Denies any lumps or bumps elsewhere. He does smoke about a pack of cigarettes a day for the past 25 years. No by diabetic issues. He can walk a half hour without difficulty. No heart issues.  (Review of systems as stated in this history (HPI) or in the review of systems. Otherwise all other 12 point ROS are negative) ` ` `  This patient encounter took 25 minutes today to perform the following: obtain history, perform exam, review outside records, interpret tests & imaging, counsel the patient on their diagnosis; and, document this encounter, including findings & plan in the electronic health record (EHR).   Past Surgical History [name], [name]; 03/30/2020 8:48 AM) No pertinent past surgical history  Diagnostic Studies History [name], [name]; 03/30/2020 8:48 AM) Colonoscopy 1-5 years ago  Allergies [name], [name]; 03/30/2020 8:49 AM) No Known Drug Allergies [03/30/2020]: Allergies Reconciled  Medication History [name], [name]; 03/30/2020 8:50 AM) Lisinopril-hydroCHLOROthiazide (20-12.5MG  Tablet, Oral) Active. Meloxicam (15MG  Tablet, Oral) Active. amLODIPine Besylate (5MG  Tablet, Oral) Active. Medications Reconciled  Social History , ; 03/30/2020 8:48 AM) Alcohol  use Occasional alcohol use. Caffeine use Carbonated beverages, Coffee, Tea. No drug use Tobacco use Current every day smoker.  Family History [name], 05/30/2020; 03/30/2020 8:48 AM) Arthritis Mother, Sister. Diabetes Mellitus Brother, Mother. Hypertension Brother, Mother. Respiratory Condition Father.  Other Problems [name], [name]; 03/30/2020 8:48 AM) Arthritis High blood pressure Hypercholesterolemia     Review of Systems [name] [name]; 03/30/2020 8:48 AM) General Not Present- Appetite Loss, Chills, Fatigue, Fever, Night Sweats, Weight Gain and Weight Loss. Skin Present- Dryness. Not Present- Change in Wart/Mole, Hives, Jaundice, New Lesions, Non-Healing Wounds, Rash and Ulcer. HEENT Present- Seasonal Allergies and Wears glasses/contact lenses. Not Present- Earache, Hearing Loss, Hoarseness, Nose Bleed, Oral Ulcers, Ringing in the Ears, Sinus Pain, Sore Throat, Visual Disturbances and Yellow Eyes. Respiratory Present- Snoring. Not Present- Bloody sputum, Chronic Cough, Difficulty Breathing and Wheezing. Cardiovascular Not Present- Chest Pain, Difficulty Breathing Lying Down, Leg Cramps, Palpitations, Rapid Heart Rate, Shortness of Breath and Swelling of Extremities. Gastrointestinal Not Present- Abdominal Pain, Bloating, Bloody Stool, Change in Bowel Habits, Chronic diarrhea, Constipation, Difficulty Swallowing, Excessive gas, Gets full quickly at meals, Hemorrhoids, Indigestion, Nausea, Rectal Pain and Vomiting. Male Genitourinary Not Present- Blood in Urine, Change in Urinary Stream, Frequency, Impotence, Nocturia, Painful Urination, Urgency and Urine Leakage. Musculoskeletal Present- Joint Pain and Joint Stiffness. Not Present- Back Pain, Muscle Pain, Muscle Weakness and Swelling of Extremities. Neurological Not Present- Decreased Memory, Fainting, Headaches, Numbness, Seizures, Tingling, Tremor, Trouble walking and Weakness. Psychiatric Not Present-  Anxiety, Bipolar, Change in Sleep Pattern, Depression, Fearful and Frequent crying. Endocrine Not Present- Cold Intolerance, Excessive Hunger, Hair Changes, Heat Intolerance, Hot flashes and New Diabetes. Hematology Not Present- Blood Thinners, Easy Bruising, Excessive bleeding, Gland problems, HIV and Persistent Infections.  Vitals [name] [name];  03/30/2020 8:49 AM) 03/30/2020 8:49 AM Weight: 244.6 lb Height: 68in Body Surface Area: 2.23 m Body Mass Index: 37.19 kg/m  Temp.: 98.32F  Pulse: 99 (Regular)  BP: 156/100 (Sitting, Left Arm, Standard)   BP (!) 169/109   Pulse 89   Temp 98.4 F (36.9 C) (Oral)   Resp 20   Ht 5\' 8"  (1.727 m)   Wt 112.1 kg   SpO2 96%   BMI 37.59 kg/m       Physical Exam Adin Hector MD; 03/30/2020 9:26 AM)  General Mental Status-Alert. General Appearance-Not in acute distress, Not Sickly. Orientation-Oriented X3. Hydration-Well hydrated. Voice-Normal.  Integumentary Global Assessment Upon inspection and palpation of skin surfaces of the - Axillae: non-tender, no inflammation or ulceration, no drainage. and Distribution of scalp and body hair is normal. General Characteristics Temperature - normal warmth is noted.  Head and Neck Head-normocephalic, atraumatic with no lesions or palpable masses. Face Global Assessment - atraumatic, no absence of expression. Neck Global Assessment - no abnormal movements, no bruit auscultated on the right, no bruit auscultated on the left, no decreased range of motion, non-tender. Trachea-midline. Thyroid Gland Characteristics - non-tender.  Eye Eyeball - Left-Extraocular movements intact, No Nystagmus - Left. Eyeball - Right-Extraocular movements intact, No Nystagmus - Right. Cornea - Left-No Hazy - Left. Cornea - Right-No Hazy - Right. Sclera/Conjunctiva - Left-No scleral icterus, No Discharge - Left. Sclera/Conjunctiva - Right-No scleral  icterus, No Discharge - Right. Pupil - Left-Direct reaction to light normal. Pupil - Right-Direct reaction to light normal.  ENMT Ears Pinna - Left - no drainage observed, no generalized tenderness observed. Pinna - Right - no drainage observed, no generalized tenderness observed. Nose and Sinuses External Inspection of the Nose - no destructive lesion observed. Inspection of the nares - Left - quiet respiration. Inspection of the nares - Right - quiet respiration. Mouth and Throat Lips - Upper Lip - no fissures observed, no pallor noted. Lower Lip - no fissures observed, no pallor noted. Nasopharynx - no discharge present. Oral Cavity/Oropharynx - Tongue - no dryness observed. Oral Mucosa - no cyanosis observed. Hypopharynx - no evidence of airway distress observed.  Chest and Lung Exam Inspection Movements - Normal and Symmetrical. Accessory muscles - No use of accessory muscles in breathing. Palpation Palpation of the chest reveals - Non-tender. Auscultation Breath sounds - Normal and Clear.  Cardiovascular Auscultation Rhythm - Regular. Murmurs & Other Heart Sounds - Auscultation of the heart reveals - No Murmurs and No Systolic Clicks.  Abdomen Inspection Inspection of the abdomen reveals - No Visible peristalsis and No Abnormal pulsations. Umbilicus - No Bleeding, No Urine drainage. Palpation/Percussion Palpation and Percussion of the abdomen reveal - Soft, Non Tender, No Rebound tenderness, No Rigidity (guarding) and No Cutaneous hyperesthesia. Note: Abdomen obese with soft. Small umbilical hernia through stalk only. Nontender. Not distended. No incisional hernias. No guarding.  Male Genitourinary Sexual Maturity Tanner 5 - Adult hair pattern and Adult penile size and shape. Note: No inguinal hernias. Normal external genitalia. Epididymi, testes, and spermatic cords normal without any masses.  Peripheral Vascular Upper Extremity Inspection - Left - No  Cyanotic nailbeds - Left, Not Ischemic. Inspection - Right - No Cyanotic nailbeds - Right, Not Ischemic.  Neurologic Neurologic evaluation reveals -normal attention span and ability to concentrate, able to name objects and repeat phrases. Appropriate fund of knowledge , normal sensation and normal coordination. Mental Status Affect - not angry, not paranoid. Cranial Nerves-Normal Bilaterally. Gait-Normal.  Neuropsychiatric Mental status  exam performed with findings of-able to articulate well with normal speech/language, rate, volume and coherence, thought content normal with ability to perform basic computations and apply abstract reasoning and no evidence of hallucinations, delusions, obsessions or homicidal/suicidal ideation.  Musculoskeletal Global Assessment Spine, Ribs and Pelvis - no instability, subluxation or laxity. Right Upper Extremity - no instability, subluxation or laxity. Note: Ellipsoid mass on left shoulder at apex overlying trapezius. 5.5 cm in largest diameter. Some vascular changes. Mildly fixed but mostly mobile and smooth and soft.  Lymphatic Head & Neck  General Head & Neck Lymphatics: Bilateral - Description - No Localized lymphadenopathy. Axillary  General Axillary Region: Bilateral - Description - No Localized lymphadenopathy. Femoral & Inguinal  Generalized Femoral & Inguinal Lymphatics: Left - Description - No Localized lymphadenopathy. Right - Description - No Localized lymphadenopathy.    Assessment & Plan  MASS OF SOFT TISSUE OF UPPER EXTREMITY (M79.89) Impression: Mass of left superior shoulder muscle along the trapezius growing in size. Seems ellipsoid and relatively soft. Most likely lipoma but some vascular changes maybe one or if it's more of a angiolipoma.  Because it has gotten larger, think it is reasonable to remove. It seems rather well circumscribed, solid don't think an MRI is needed at this time. He is interested in  proceeding.  Might need a drain postop. We will see.  The pathophysiology of skin & subcutaneous masses was discussed. Natural history risks without surgery were discussed. I recommended surgery to remove the mass. I explained the technique of removal with use of local anesthesia & possible need for more aggressive sedation/anesthesia for patient comfort.  Risks such as bleeding, infection, wound breakdown, heart attack, death, and other risks were discussed. I noted a good likelihood this will help address the problem. Possibility that this will not correct all symptoms was explained. Possibility of regrowth/recurrence of the mass was discussed. We will work to minimize complications. Questions were answered. The patient expresses understanding & wishes to proceed with surgery.    TOBACCO ABUSE (Z72.0) Impression: STOP SMOKING!  We talked to the patient about the dangers of smoking. We stressed that tobacco use dramatically increases the risk of peri-operative complications such as infection, tissue necrosis leaving to problems with incision/wound and organ healing, hernia, chronic pain, heart attack, stroke, DVT, pulmonary embolism, and death. We noted there are programs in our community to help stop smoking. Information was available.  Current Plans Pt Education - [facility] STOP SMOKING!  OBESITY (BMI 30-39.9) (E66.9)   UMBILICAL HERNIA WITHOUT OBSTRUCTION AND WITHOUT GANGRENE (K42.9) Impression: Very small umbilical hernia. Asymptomatic. Most likely present there for a long period time. Would not fix unless bothers or changes  Ardeth Sportsman, MD, FACS, MASCRS Gastrointestinal and Minimally Invasive Surgery  [facility] [address], [address] [place], [place] [address] [phone] Main / Paging [phone] Fax

## 2020-06-11 NOTE — Discharge Instructions (Signed)
Information for Discharge Teaching: EXPAREL (bupivacaine liposome injectable suspension)   Your surgeon gave you EXPAREL(bupivacaine) in your surgical incision to help control your pain after surgery.   EXPAREL is a local anesthetic that provides pain relief by numbing the tissue around the surgical site.  EXPAREL is designed to release pain medication over time and can control pain for up to 72 hours.  Depending on how you respond to EXPAREL, you may require less pain medication during your recovery.  Possible side effects:  Temporary loss of sensation or ability to move in the area where bupivacaine was injected.  Nausea, vomiting, constipation  Rarely, numbness and tingling in your mouth or lips, lightheadedness, or anxiety may occur.  Call your doctor right away if you think you may be experiencing any of these sensations, or if you have other questions regarding possible side effects.  Follow all other discharge instructions given to you by your surgeon or nurse. Eat a healthy diet and drink plenty of water or other fluids.  If you return to the hospital for any reason within 96 hours following the administration of EXPAREL, please inform your health care providers. Post Anesthesia Home Care Instructions  Activity: Get plenty of rest for the remainder of the day. A responsible adult should stay with you for 24 hours following the procedure.  For the next 24 hours, DO NOT: -Drive a car -Advertising copywriter -Drink alcoholic beverages -Take any medication unless instructed by your physician -Make any legal decisions or sign important papers.  Meals: Start with liquid foods such as gelatin or soup. Progress to regular foods as tolerated. Avoid greasy, spicy, heavy foods. If nausea and/or vomiting occur, drink only clear liquids until the nausea and/or vomiting subsides. Call your physician if vomiting continues.  Special Instructions/Symptoms: Your throat may feel dry or sore  from the anesthesia or the breathing tube placed in your throat during surgery. If this causes discomfort, gargle with warm salt water. The discomfort should disappear within 24 hours.  If you had a scopolamine patch placed behind your ear for the management of post- operative nausea and/or vomiting:  1. The medication in the patch is effective for 72 hours, after which it should be removed.  Wrap patch in a tissue and discard in the trash. Wash hands thoroughly with soap and water. 2. You may remove the patch earlier than 72 hours if you experience unpleasant side effects which may include dry mouth, dizziness or visual disturbances. 3. Avoid touching the patch. Wash your hands with soap and water after contact with the patch.   GENERAL SURGERY: POST OP INSTRUCTIONS  ######################################################################  EAT Gradually transition to a high fiber diet with a fiber supplement over the next few weeks after discharge.  Start with a pureed / full liquid diet (see below)  WALK Walk an hour a day.  Control your pain to do that.    CONTROL PAIN Control pain so that you can walk, sleep, tolerate sneezing/coughing, go up/down stairs.  HAVE A BOWEL MOVEMENT DAILY Keep your bowels regular to avoid problems.  OK to try a laxative to override constipation.  OK to use an antidairrheal to slow down diarrhea.  Call if not better after 2 tries  CALL IF YOU HAVE PROBLEMS/CONCERNS Call if you are still struggling despite following these instructions. Call if you have concerns not answered by these instructions  ######################################################################    1. DIET: Follow a light bland diet & liquids the first 24 hours after arrival home,  such as soup, liquids, starches, etc.  Be sure to drink plenty of fluids.  Quickly advance to a usual solid diet within a few days.  Avoid fast food or heavy meals as your are more likely to get nauseated or  have irregular bowels.  A low-fat, high-fiber diet for the rest of your life is ideal.   2. Take your usually prescribed home medications unless otherwise directed. 3. PAIN CONTROL: a. Pain is best controlled by a usual combination of three different methods TOGETHER: i. Ice/Heat ii. Over the counter pain medication iii. Prescription pain medication b. Most patients will experience some swelling and bruising around the incisions.  Ice packs or heating pads (30-60 minutes up to 6 times a day) will help. Use ice for the first few days to help decrease swelling and bruising, then switch to heat to help relax tight/sore spots and speed recovery.  Some people prefer to use ice alone, heat alone, alternating between ice & heat.  Experiment to what works for you.  Swelling and bruising can take several weeks to resolve.   c. It is helpful to take an over-the-counter pain medication regularly for the first few weeks.  Choose one of the following that works best for you: i. Naproxen (Aleve, etc)  Two 220mg  tabs twice a day ii. Ibuprofen (Advil, etc) Three 200mg  tabs four times a day (every meal & bedtime) iii. Acetaminophen (Tylenol, etc) 500-650mg  four times a day (every meal & bedtime) d. A  prescription for pain medication (such as oxycodone, hydrocodone, etc) should be given to you upon discharge.  Take your pain medication as prescribed.  i. If you are having problems/concerns with the prescription medicine (does not control pain, nausea, vomiting, rash, itching, etc), please call [phone] to see if we need to switch you to a different pain medicine that will work better for you and/or control your side effect better. ii. If you need a refill on your pain medication, please contact your pharmacy.  They will contact our office to request authorization. Prescriptions will not be filled after 5 pm or on week-ends. 4. Avoid getting constipated.  Between the surgery and the pain medications, it is  common to experience some constipation.  Increasing fluid intake and taking a fiber supplement (such as Metamucil, Citrucel, FiberCon, MiraLax, etc) 1-2 times a day regularly will usually help prevent this problem from occurring.  A mild laxative (prune juice, Milk of Magnesia, MiraLax, etc) should be taken according to package directions if there are no bowel movements after 48 hours.   5. Wash / shower every day.  You may shower over the dressings as they are waterproof.  Continue to shower over incision(s) after the dressing is off. 6. Remove your waterproof bandages 5 days after surgery.  You may leave the incision open to air.  You may have skin tapes (Steri Strips) covering the incision(s).  Leave them on until one week, then remove.  You may replace a dressing/Band-Aid to cover the incision for comfort if you wish.      7. ACTIVITIES as tolerated:   a. You may resume regular (light) daily activities beginning the next day--such as daily self-care, walking, climbing stairs--gradually increasing activities as tolerated.  If you can walk 30 minutes without difficulty, it is safe to try more intense activity such as jogging, treadmill, bicycling, low-impact aerobics, swimming, etc. b. Save the most intensive and strenuous activity for last such as sit-ups, heavy lifting, contact sports, etc  Refrain from any heavy lifting or straining until you are off narcotics for pain control.   c. DO NOT PUSH THROUGH PAIN.  Let pain be your guide: If it hurts to do something, don't do it.  Pain is your body warning you to avoid that activity for another week until the pain goes down. d. You may drive when you are no longer taking prescription pain medication, you can comfortably wear a seatbelt, and you can safely maneuver your car and apply brakes. e. Bonita Quin may have sexual intercourse when it is comfortable.  8. FOLLOW UP in our office a. Please call [facility] at [phone] to set up an appointment to see your  surgeon in the office for a follow-up appointment approximately 2-3 weeks after your surgery. b. Make sure that you call for this appointment the day you arrive home to insure a convenient appointment time. 9. IF YOU HAVE DISABILITY OR FAMILY LEAVE FORMS, BRING THEM TO THE OFFICE FOR PROCESSING.  DO NOT GIVE THEM TO YOUR DOCTOR.   WHEN TO CALL us [phone]: 1. Poor pain control 2. Reactions / problems with new medications (rash/itching, nausea, etc)  3. Fever over 101.5 F (38.5 C) 4. Worsening swelling or bruising 5. Continued bleeding from incision. 6. Increased pain, redness, or drainage from the incision 7. Difficulty breathing / swallowing   The clinic staff is available to answer your questions during regular business hours (8:30am-5pm).  Please don't hesitate to call and ask to speak to one of our nurses for clinical concerns.   If you have a medical emergency, go to the nearest emergency room or call 911.  A surgeon from [facility] is always on call at the [facility], [place] [address], [place], [place]  [address] ? MAIN: [phone] ? TOLL FREE: [phone] ?  FAX [phone] [email or web address]  DRAIN CARE:   You have a closed bulb drain to help you heal.    A bulb drain is a small, plastic reservoir which creates a gentle suction. It is used to remove excess fluid from a surgical wound. The color and amount of fluid will vary. Immediately after surgery, the fluid is bright red. It may gradually change to a yellow color. When the amount decreases to about 1 or 2 tablespoons (15 to 30 cc) per 24 hours, your caregiver will usually remove it.  JP Care  The Jackson-Pratt drainage system has flexible tubing attached to a soft, plastic bulb with a stopper. The drainage end of the tubing, which is flat and white, goes into your body through a small opening near your incision (surgical cut). A stitch  holds the drainage end in place. The rest of the tube is outside your body, attached to the bulb. When the bulb is compressed with the stopper in place, it creates a vacuum. This causes a constant gentle suction, which helps draw out fluid that collects under your incision. The bulb should be compressed at all times, except when you are emptying the drainage.  How long you will have your Jackson-Pratt depends on your surgery and the amount of fluid is draining. This is different for everyone. The Jackson-Pratt is usually removed when the drainage is 30 mL or less over 24 hours. To keep track of how much drainage you're having, you will record the amount in a drainage log. It's important to bring the log with you to your follow-up appointments.  Caring for Your  Jackson-Pratt at Home In order to care for your Jackson-Pratt at home, you or your caregiver will do the following:  Empty the drain once a day and record the color and amount of drainage  Care for the area where the tubing enters your skin by washing with soap and water.  Milk the tubing to help move clots into the bulb.  Do this before you empty and measure your drainage. Look in the mirror at the tubing. This will help you see where your hands need to be. Pinch the tubing close to where it goes into your skin between your thumb and forefinger. With the thumb and forefinger of your other hand, pinch the tubing right below your other fingers. Keep your fingers pinched and slide them down the tubing, pushing any clots down toward the bulb. You may want to use alcohol swabs to help you slide your fingers down the tubing. Repeat steps 3 and 4 as necessary to push clots from the tubing into the bulb. If you are not able to move a clot into the bulb, call your doctor's office. The fluid may leak around the insertion site if a clot is blocking the drainage flow. If there is fluid in the bulb and no leakage at the insertion site, the drain is  working.  How to Empty Your Jackson-Pratt and Record the Drainage You will need to empty your Jackson-Pratt every day  Gather the following supplies:  Measuring container your nurse gave you Jackson-Pratt Drainage Record  Pen or pencil  Instructions Clean an area to work on. Clean your hands thoroughly. Unplug the stopper on top of your Jackson-Pratt. This will cause the bulb to expand. Do not touch the inside of the stopper or the inner area of the opening on the bulb. Turn your Jackson-Pratt upside down, gently squeeze the bulb, and pour the drainage into the measuring container. Turn your Jackson-Pratt right side up. Squeeze the bulb until your fingers feel the palm of your hand. Keep squeezing the bulb while you replug the stopper. Make sure the bulb stays fully compressed to ensure constant, gentle suction.    Check the amount and color of drainage in the measuring container. The first couple days after surgery the fluid may be dark red. This is normal. As you heal the fluid may look pink or pale yellow. Record this amount and the color of drainage on your Jackson-Pratt Drainage Record. Flush the drainage down the toilet and rinse the measuring container with water.  Caring for the Insertion Site  Once you have emptied the drainage, clean your hands again. Check the area around the insertion site. Look for tenderness, swelling, or pus. If you have any of these, or if you have a temperature of 101 F (38.3 C) or higher, you may have an infection. Call your doctor's office.  Sometimes, the drain causes redness the size of a dime at your insertion site. This is normal. Your healthcare provider will tell you if you should place a bandage over the insertion site.  Wash drain site with soap & water (dilute hydrogen peroxide PRN) daily & replace clean dressing / tape    DAILY CARE  Keep the bulb compressed at all times, except while emptying it. The compression creates suction.    Keep sites where the tubes enter the skin dry and covered with a light bandage (dressing).   Tape the tubes to your skin, 1 to 2 inches below the insertion sites, to keep from pulling  on your stitches. Tubes are stitched in place and will not slip out.   Pin the bulb to your shirt (not to your pants) with a safety pin.   For the first few days after surgery, there usually is more fluid in the bulb. Empty the bulb whenever it becomes half full because the bulb does not create enough suction if it is too full. Include this amount in your 24 hour totals.   When the amount of drainage decreases, empty the bulb at the same time every day. Write down the amounts and the 24 hour totals. Your caregiver will want to know them. This helps your caregiver know when the tubes can be removed.   (We anticipate removing the drain in 1-3 weeks, depending on when the output is <6mL a day for 2+ days)  If there is drainage around the tube sites, change dressings and keep the area dry. If you see a clot in the tube, leave it alone. However, if the tube does not appear to be draining, let your caregiver know.  TO EMPTY THE BULB  Open the stopper to release suction.   Holding the stopper out of the way, pour drainage into the measuring cup that was sent home with you.   Measure and write down the amount. If there are 2 bulbs, note the amount of drainage from bulb 1 or bulb 2 and keep the totals separate. Your caregiver will want to know which tube is draining more.   Compress the bulb by folding it in half.   Replace the stopper.   Check the tape that holds the tube to your skin, and pin the bulb to your shirt.  SEEK MEDICAL CARE IF:  The drainage develops a bad odor.   You have an oral temperature above 102 F (38.9 C).   The amount of drainage from your wound suddenly increases or decreases.   You accidentally pull out your drain.   You have any other questions or concerns.  MAKE SURE YOU:    Understand these instructions.   Will watch your condition.   Will get help right away if you are not doing well or get worse.     Call our office if you have any questions about your drain. [phone]

## 2020-06-11 NOTE — Transfer of Care (Signed)
Immediate Anesthesia Transfer of Care Note  Patient: Alan Coffey  Procedure(s) Performed: REMOVAL OF LEFT SHOULDER SUBCUTANEOUS MASS (Left )  Patient Location: PACU  Anesthesia Type:MAC  Level of Consciousness: awake, alert  and oriented  Airway & Oxygen Therapy: Patient Spontanous Breathing and Patient connected to face mask oxygen  Post-op Assessment: Report given to RN and Post -op Vital signs reviewed and stable  Post vital signs: Reviewed and stable  Last Vitals:  Vitals Value Taken Time  BP    Temp 36.6 C 06/11/20 0854  Pulse 77 06/11/20 0854  Resp 16 06/11/20 0854  SpO2 100 % 06/11/20 0854  Vitals shown include unvalidated device data.  Last Pain:  Vitals:   06/11/20 0854  TempSrc: Oral  PainSc:          Complications: No complications documented.

## 2020-06-14 ENCOUNTER — Encounter (HOSPITAL_BASED_OUTPATIENT_CLINIC_OR_DEPARTMENT_OTHER): Payer: Self-pay | Admitting: Surgery

## 2020-07-09 ENCOUNTER — Encounter (HOSPITAL_COMMUNITY): Payer: Self-pay

## 2020-07-12 LAB — SURGICAL PATHOLOGY

## 2021-04-28 ENCOUNTER — Ambulatory Visit: Payer: Self-pay

## 2021-04-28 ENCOUNTER — Ambulatory Visit (INDEPENDENT_AMBULATORY_CARE_PROVIDER_SITE_OTHER): Payer: No Typology Code available for payment source | Admitting: Specialist

## 2021-04-28 ENCOUNTER — Other Ambulatory Visit: Payer: Self-pay

## 2021-04-28 ENCOUNTER — Encounter: Payer: Self-pay | Admitting: Specialist

## 2021-04-28 VITALS — BP 158/81 | HR 94 | Ht 69.0 in | Wt 251.8 lb

## 2021-04-28 DIAGNOSIS — M5136 Other intervertebral disc degeneration, lumbar region: Secondary | ICD-10-CM

## 2021-04-28 DIAGNOSIS — M25551 Pain in right hip: Secondary | ICD-10-CM

## 2021-04-28 DIAGNOSIS — M4807 Spinal stenosis, lumbosacral region: Secondary | ICD-10-CM

## 2021-04-28 DIAGNOSIS — M4817 Ankylosing hyperostosis [Forestier], lumbosacral region: Secondary | ICD-10-CM

## 2021-04-28 DIAGNOSIS — M47816 Spondylosis without myelopathy or radiculopathy, lumbar region: Secondary | ICD-10-CM

## 2021-04-28 DIAGNOSIS — M545 Low back pain, unspecified: Secondary | ICD-10-CM

## 2021-04-28 DIAGNOSIS — M160 Bilateral primary osteoarthritis of hip: Secondary | ICD-10-CM

## 2021-04-28 DIAGNOSIS — M25552 Pain in left hip: Secondary | ICD-10-CM

## 2021-04-28 MED ORDER — DICLOFENAC SODIUM 75 MG PO TBEC: 75.0000 mg | DELAYED_RELEASE_TABLET | Freq: Two times a day (BID) | ORAL | 3 refills | Status: DC

## 2021-04-28 NOTE — Patient Instructions (Addendum)
Avoid bending, stooping and avoid lifting weights greater than 10 lbs. Avoid prolong standing and walking. Avoid frequent bending and stooping  No lifting greater than 10 lbs. May use ice or moist heat for pain. Weight loss is of benefit. Handicap license is approved. MRI of the lumbar spine is ordered to assess for nerve compression Referral to Dr. Magnus Ivan for consideration of bilateral hip replacement surgery. Long shoe horn and sock applicators are helpful, and long tongs.

## 2021-04-28 NOTE — Progress Notes (Signed)
Office Visit Note   Patient: Alan Coffey           Date of Birth: 05-07-1966           MRN: [id number] Visit Date: 04/28/2021              Requested by: Sandi Mariscal, [place],  [place] [address] PCP: Sandi Mariscal, MD   Assessment & Plan: Visit Diagnoses:  1. Pain in left hip   2. Low back pain, unspecified back pain laterality, unspecified chronicity, unspecified whether sciatica present   3. Pain in right hip   4. Spinal stenosis of lumbosacral region   5. Bilateral primary osteoarthritis of hip   6. Degenerative disc disease, lumbar   7. Forestier's disease of lumbosacral region   8. Spondylosis without myelopathy or radiculopathy, lumbar region     Plan: Avoid bending, stooping and avoid lifting weights greater than 10 lbs. Avoid prolong standing and walking. Avoid frequent bending and stooping  No lifting greater than 10 lbs. May use ice or moist heat for pain. Weight loss is of benefit. Handicap license is approved. MRI of the lumbar spine is ordered to assess for nerve compression Referral to Dr. Ninfa Linden for consideration of bilateral hip replacement surgery. Long shoe horn and sock applicators are helpful, and long tongs.  Follow-Up Instructions: Return in about 3 weeks (around 05/19/2021), or Se me in 3 weeks, for See Dr. Ninfa Linden in 2-3 weeks for eval for probable bilateral total hip replacement..   Orders:  Orders Placed This Encounter  Procedures  . XR Lumbar Spine 2-3 Views  . XR HIP UNILAT W OR W/O PELVIS 2-3 VIEWS LEFT  . XR HIP UNILAT W OR W/O PELVIS 2-3 VIEWS RIGHT   No orders of the defined types were placed in this encounter.     Procedures: No procedures performed   Clinical Data: No additional findings.   Subjective: Chief Complaint  Patient presents with  . Lower Back - Pain  . Right Hip - Pain  . Left Hip - Pain    55 year old male with history of increasing pain into the back and radiates into the right  posterior thigh greater than left. The pain will radiates done the back of the right thigh into the right knee.  There is feeling of fatique and tightness into the thighs with standng and walking. He has difficulty ascending stairs and with shoes and socks. Trouble reaching his feet. He feels cracking and popping in the legs, knees and hips all the bones. Advil helps, hot showers help. AM with stiffness and in the evening and all day really. Weather that is cool or damp is worse. No bowel or bladder difficulty.  He is not having any weight changes. Did have some blood in his stool one week not sure where it was coming from. It was bright.    Review of Systems  Constitutional: Negative.   HENT: Negative.   Eyes: Negative.   Respiratory: Negative.   Cardiovascular: Negative.   Gastrointestinal: Negative.   Endocrine: Negative.   Genitourinary: Negative.   Musculoskeletal: Negative.   Skin: Negative.   Allergic/Immunologic: Negative.   Neurological: Negative.   Hematological: Negative.   Psychiatric/Behavioral: Negative.      Objective: Vital Signs: BP (!) 158/81   Pulse 94   Ht _0  (1.753 m)   Wt 251 lb 12.8 oz (114.2 kg)   BMI 37.18 kg/m   Physical Exam  Ortho Exam  Specialty Comments:  No specialty comments available.  Imaging: XR HIP UNILAT W OR W/O PELVIS 2-3 VIEWS LEFT  Result Date: 04/28/2021 AP pelvis and lateral left hip demonstrates severe left hip degenerative changes with loss of joint line to bone on bone appearance superolaterally, there is deformity of the femoral head with oblong deformity of the femoral head with subchondral cyst changes on the femoral head and acetabular sides of the hip joint. Large superolateral osteophyte present.   XR HIP UNILAT W OR W/O PELVIS 2-3 VIEWS RIGHT  Result Date: 04/28/2021 AP pelvis and lateral right hip demonstrates severe right hip degenerative changes with loss of joint line to bone on bone appearance superolaterally,  there is superior migration of the femoral head with oblong deformity of the femoral head with subchondral cyst changes on the femoral head and acetabular sides of the hip joint. Large superolateral osteophyte present.     PMFS History: Patient Active Problem List   Diagnosis Date Noted  . Mass of soft tissue of left upper extremity 03/30/2020   Past Medical History:  Diagnosis Date  . Arthritis   . Dry skin    on chest using goldbond lotion on  . Hypertension   . Sciatic nerve pain right comes and goes    History reviewed. No pertinent family history.  Past Surgical History:  Procedure Laterality Date  . MASS EXCISION Left 06/11/2020   Procedure: REMOVAL OF LEFT SHOULDER SUBCUTANEOUS MASS;  Surgeon: Michael Boston, MD;  Location: [facility];  Service: General;  Laterality: Left;  MAC VS GENERAL (ANESTHESIA CHOICE)  . NO PAST SURGERIES     Social History   Occupational History  . Not on file  Tobacco Use  . Smoking status: Current Every Day Smoker    Packs/day: 1.00    Years: 30.00    Pack years: 30.00    Types: Cigarettes  . Smokeless tobacco: Never Used  Vaping Use  . Vaping Use: Never used  Substance and Sexual Activity  . Alcohol use: Not Currently  . Drug use: Not Currently  . Sexual activity: Not on file

## 2021-05-19 ENCOUNTER — Ambulatory Visit (INDEPENDENT_AMBULATORY_CARE_PROVIDER_SITE_OTHER): Payer: No Typology Code available for payment source | Admitting: Orthopaedic Surgery

## 2021-05-19 ENCOUNTER — Other Ambulatory Visit: Payer: Self-pay

## 2021-05-19 ENCOUNTER — Ambulatory Visit
Admission: RE | Admit: 2021-05-19 | Discharge: 2021-05-19 | Disposition: A | Discharge status: Home / Self Care | Payer: No Typology Code available for payment source | Source: Ambulatory Visit | Attending: Specialist | Admitting: Specialist

## 2021-05-19 VITALS — Ht 69.0 in | Wt 251.8 lb

## 2021-05-19 DIAGNOSIS — M1612 Unilateral primary osteoarthritis, left hip: Secondary | ICD-10-CM | POA: Diagnosis not present

## 2021-05-19 DIAGNOSIS — M4807 Spinal stenosis, lumbosacral region: Secondary | ICD-10-CM

## 2021-05-19 DIAGNOSIS — M1611 Unilateral primary osteoarthritis, right hip: Secondary | ICD-10-CM | POA: Insufficient documentation

## 2021-05-19 IMAGING — MR MR LUMBAR SPINE W/O CM
4 of 5 series · 23 of 48 positions shown · non-contrast
Comparison: Lumbar spine radiographs [DATE].

CLINICAL DATA: Spinal stenosis of lumbosacral region. Spinal
stenosis, lumbosacral; low back pain, progressive neurologic
deficit; lumbar degenerative disc disease, spondylolisthesis L3-L4
minor, claudication symptoms. Additional history provided by
scanning technologist: Patient reports bilateral leg/hip/groin pain
for 6 months, bilateral hip stiffness.

EXAM:
MRI LUMBAR SPINE WITHOUT CONTRAST
TECHNIQUE: Multiplanar, multisequence MR imaging of the lumbar spine was
performed. No intravenous contrast was administered.

[Series 3: T2 · sagittal · 4.0mm · 0.55mm/px · 6 of 17 slices shown (1 of 2)]
[im 1/17]
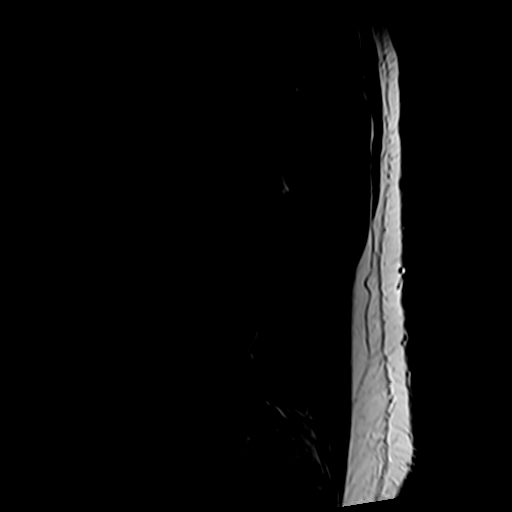
[im 4/17]
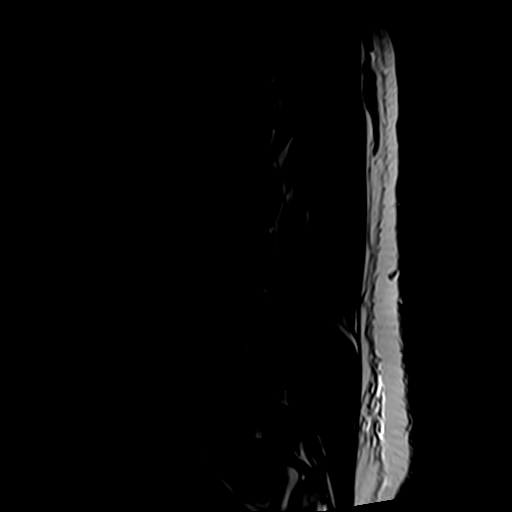
[im 7/17]
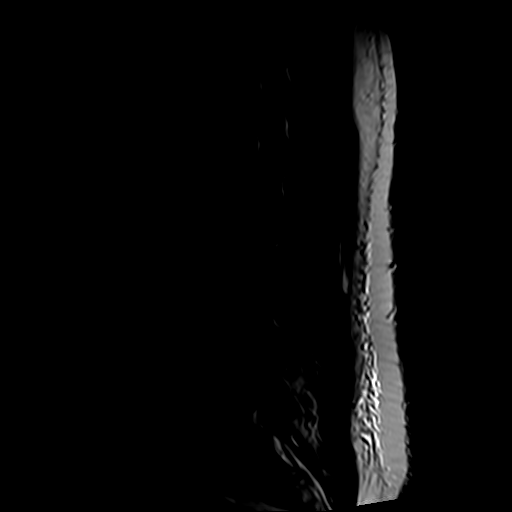
[im 10/17]
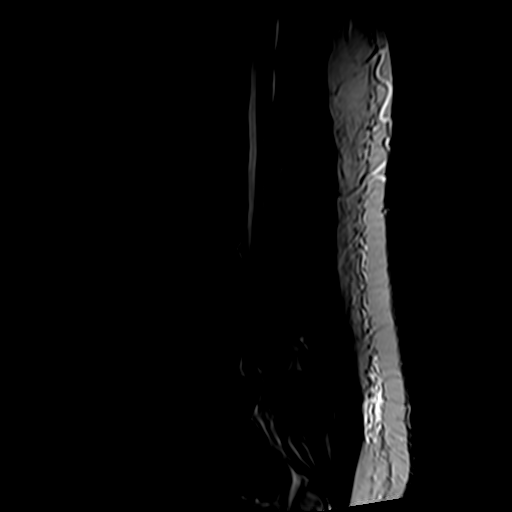
[im 13/17]
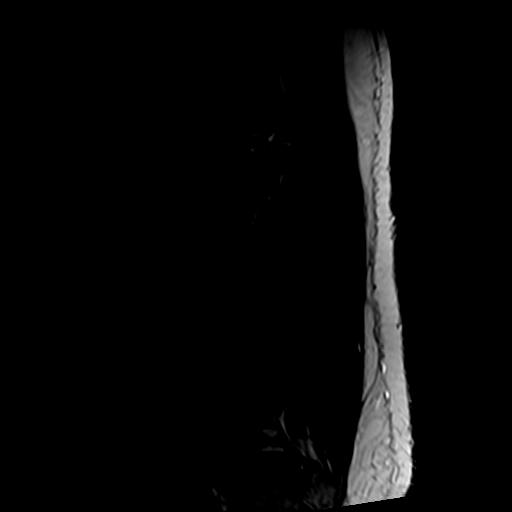
[im 17/17]
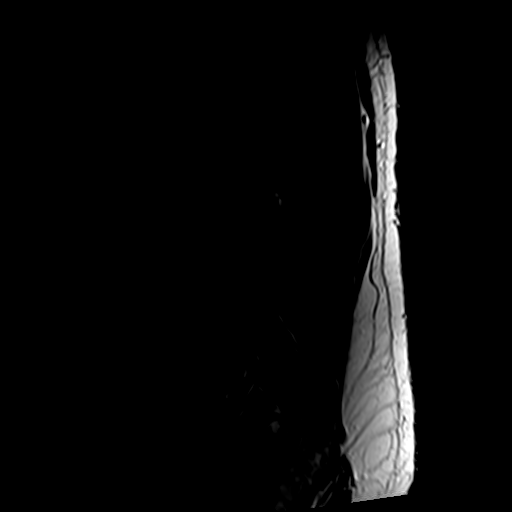

[Series 5: T1 · sagittal · 4.0mm · 0.55mm/px · 5 of 17 slices shown (1 of 2)]
[im 1/17]
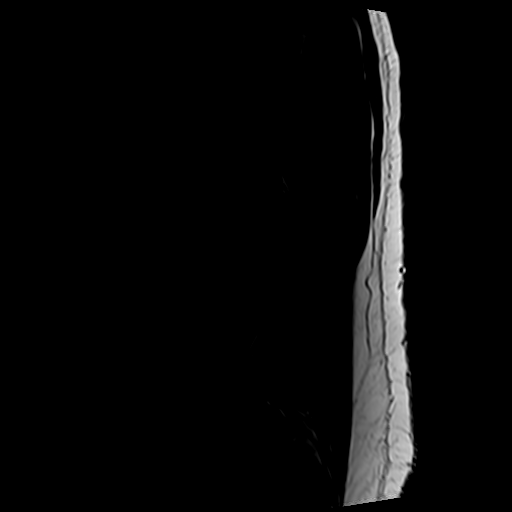
[im 4/17]
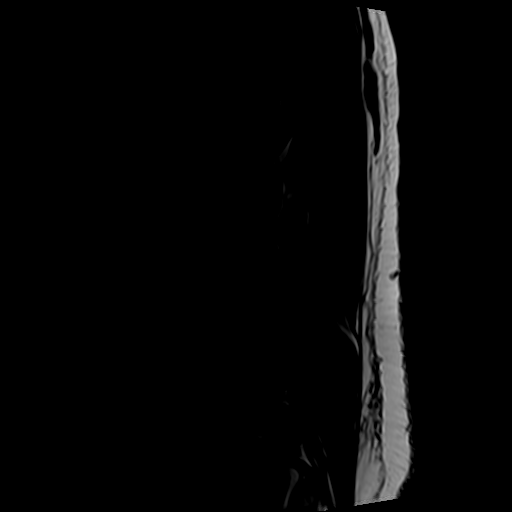
[im 7/17]
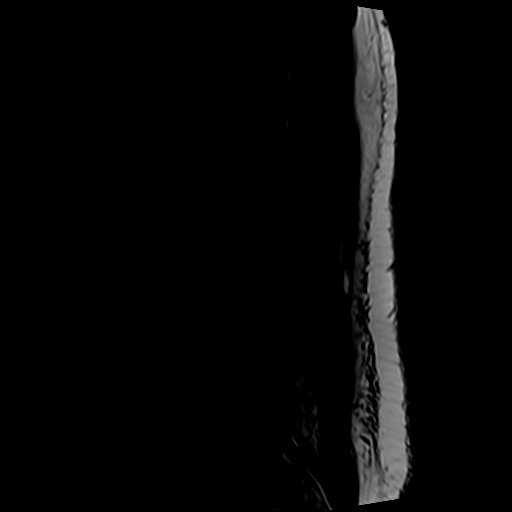
[im 10/17]
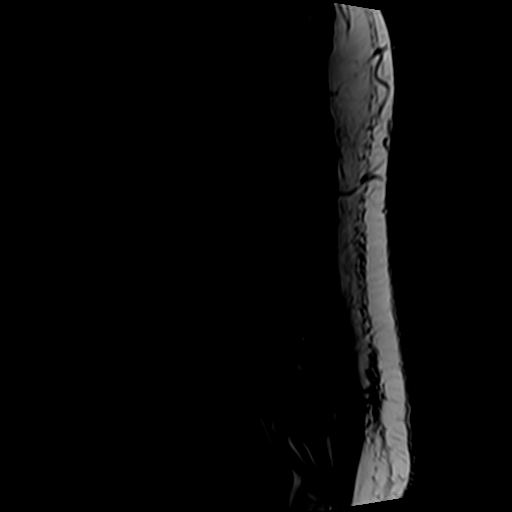
[im 17/17]
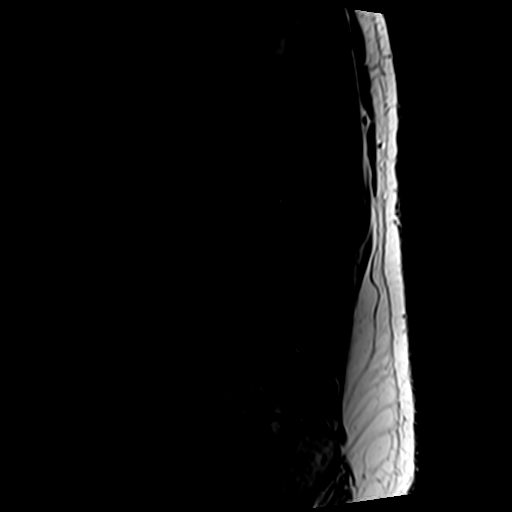

[Series 6: T2 · axial · 4.0mm · 0.70mm/px · z∈[-120,+90]mm · 9 of 39 slices shown (2 of 2)]
[im 1/39]
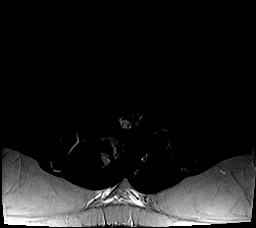
[im 6/39]
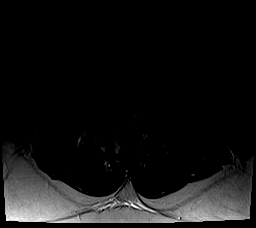
[im 11/39]
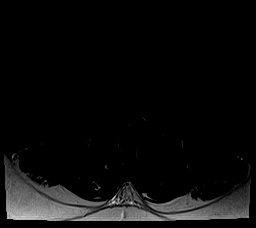
[im 17/39]
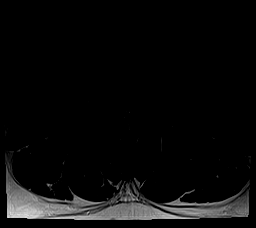
[im 20/39]
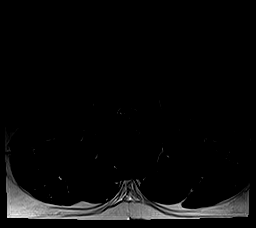
[im 22/39]
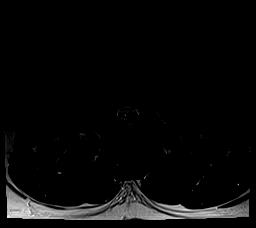
[im 28/39]
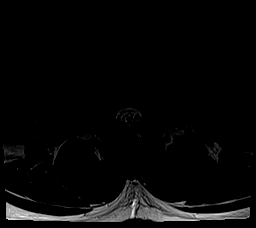
[im 33/39]
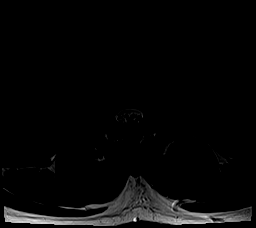
[im 39/39]
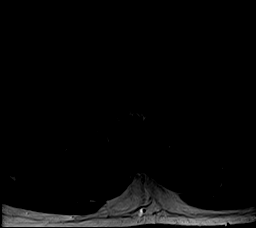

[Series 7: T1 · axial · 4.0mm · 0.35mm/px · z∈[-94,+59]mm · 3 of 39 slices shown (2 of 2)]
[im 6/39]
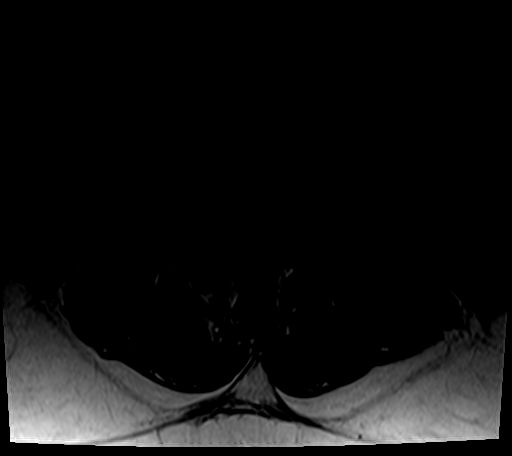
[im 20/39]
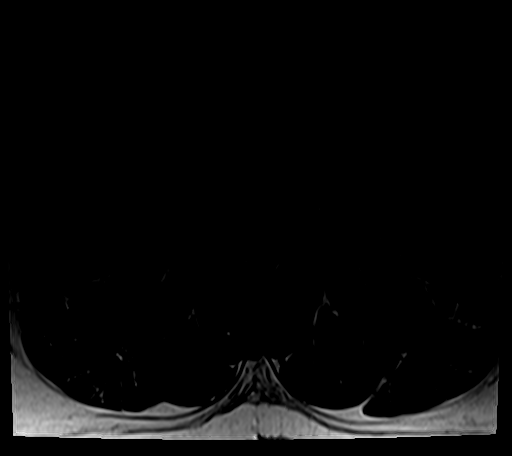
[im 33/39]
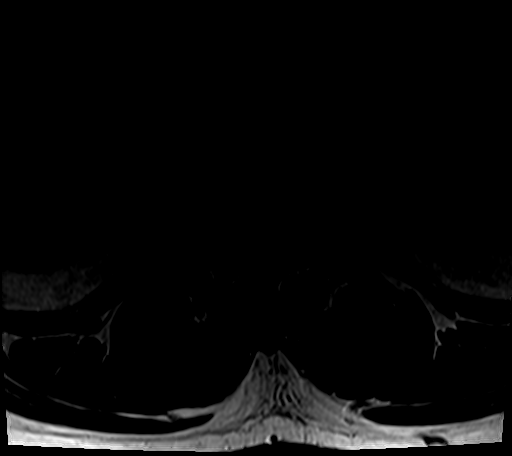

[23 of 48 positions shown; findings below may reference images not displayed]

FINDINGS: Segmentation: Transitional lumbosacral anatomy. For the purposes of
this dictation, the S1 level is transitional. A relatively
well-formed intervertebral disc space and a left-sided assimilation
joint are present at S1-S2.

Alignment: Straightening of the expected lumbar lordosis. No
significant spondylolisthesis.

Vertebrae: Vertebral body height is maintained. Trace marrow edema
along the left-sided assimilation joint at S1-S2, likely
degenerative. Trace degenerative endplate edema at L5-S1. Elsewhere,
no significant marrow edema or focal suspicious osseous lesion is
identified.

Conus medullaris and cauda equina: Conus extends to the L1-L2 level.
No signal abnormality within the visualized distal spinal cord.

Paraspinal and other soft tissues: No abnormality identified within
included portions of the abdomen/retroperitoneum. Paraspinal soft
tissues within normal limits.

Disc levels:

Multilevel disc degeneration, most notably at T11-T12
(mild/moderate), L4-L5, mild) and L4-L5 (mild).

Congenitally narrow lumbar spinal canal on the basis of short
pedicles.

T11-T12: Disc bulge. Superimposed right center to right foraminal
disc extrusion with cranial migration into the neural foramen. Facet
arthrosis. Mild relative spinal canal stenosis. Bilateral neural
foraminal narrowing (moderate right, mild/moderate left).

T12-L1: Mild facet arthrosis. No significant disc herniation or
degenerative stenosis.

L1-L2: Mild facet arthrosis. No significant disc herniation or
degenerative stenosis.

L2-L3: Mild facet arthrosis. No significant disc herniation or
degenerative stenosis.

L3-L4: Disc bulge. Superimposed broad-based left subarticular to
left extraforaminal disc protrusion. Mild facet arthrosis. The disc
protrusion contributes to moderate left subarticular stenosis,
contacting and posteriorly displacing the descending left L4 nerve
root (series 6, image 24). Mild right subarticular and central canal
narrowing. Bilateral neural foraminal narrowing (mild right, severe
left).

L4-L5: Disc bulge with mild endplate spurring. Superimposed small
left foraminal disc protrusion with slight cranial migration into
the neural foramen. Moderate facet arthrosis with ligamentum flavum
hypertrophy. Trace left facet joint effusion. Moderate left greater
than right subarticular stenosis with potential to affect the
descending left L5 nerve root. Moderate to moderately severe central
canal stenosis. Bilateral neural foraminal narrowing (moderate
right, severe left).

L5-S1: Disc bulge with endplate spurring. Superimposed broad-based
central/right subarticular disc protrusion at site of posterior
annular fissure. Mild facet arthrosis/ligamentum flavum hypertrophy.
Bilateral subarticular narrowing (severe right, moderate left) with
potential to affect either descending S1 nerve root. Moderate to
moderately severe central canal stenosis. Bilateral neural foraminal
narrowing (severe right, moderate left).

S1-S2: Transitional level. Facet arthrosis (greater on the right).
No significant disc herniation or stenosis.
IMPRESSION: Transitional lumbosacral anatomy. For the purposes of this
dictation, the S1 level is transitional. A relatively well-formed
intervertebral disc space and a left-sided assimilation joint are
present at S1-S2.

Lumbar and lower thoracic spondylosis superimposed upon a
congenitally narrow lumbar spinal canal, as outlined and with
findings most notably as follows.

At L4-L5, there is multifactorial moderate left greater than right
subarticular narrowing with potential to affect the descending left
L5 nerve root. Moderate to moderately advanced central canal
stenosis. Bilateral neural foraminal narrowing (moderate right,
severe left). Notably, a small disc extrusion contributes to severe
left neural foraminal narrowing.

At L5-S1, a central/right subarticular disc protrusion contributes
to multifactorial bilateral subarticular narrowing (severe right,
moderate left) with potential to affect either descending S1 nerve
root. Moderate to moderately advanced central canal stenosis.
Bilateral neural foraminal narrowing (severe right, moderate left).

At L3-L4, a disc protrusion contributes to moderate left
subarticular stenosis, contacting and posteriorly displacing the
descending left L4 nerve root. It also contributes to severe left
neural foraminal narrowing.

At T11-T12: A disc extrusion contributes to moderate right neural
foraminal narrowing. It also contributes to multifactorial mild
relative spinal canal narrowing at this level.

## 2021-05-19 NOTE — Progress Notes (Signed)
Office Visit Note   Patient: Alan Coffey           Date of Birth: 04-Mar-1966           MRN: [id number] Visit Date: 05/19/2021              Requested by: Sandi Mariscal, [place],   [id number] PCP: Sandi Mariscal, MD   Assessment & Plan: Visit Diagnoses:  1. Unilateral primary osteoarthritis, left hip   2. Unilateral primary osteoarthritis, right hip     Plan: The patient does have severe end-stage arthritis of both his hips and I am recommending hip replacement surgery given the failed conservative treatment for over 12 months combined with the radiographic findings and clinical exam findings as well as worsening pain.  I had a long and thorough discussion about joint replacement surgery with him.  I showed him a hip model and went over his x-rays.  I even gave him a handout about hip replacement surgery.  We talked about the risks and benefits of surgery and described the interoperative and postoperative course.  All questions and concerns were answered and addressed.  We will work on getting his left hip replaced first.  Follow-Up Instructions: Return for 2 weeks post-op.   Orders:  No orders of the defined types were placed in this encounter.  No orders of the defined types were placed in this encounter.     Procedures: No procedures performed   Clinical Data: No additional findings.   Subjective: Chief Complaint  Patient presents with  . Right Hip - Pain  . Left Hip - Pain  The patient is a very pleasant 55 year old gentleman sent from my partner Dr. Louanne Skye to evaluate and treat severe end-stage arthritis of both hips.  The patient does report bilateral hip pain with the left being a little bit worse than the right.  This is been getting worse for the 12 months.  He is not diabetic but he is a smoker.  He has tried and failed other forms of conservative treatment including activity modification, anti-inflammatories, hip strengthening exercises and  weight loss.  X-rays of both hips show severe end-stage arthritis.  He is hoping to have both hips replaced at once.  I told him this is certainly more difficult in the obese population and being a smoker.  He felt like the left hip was the worst one then.  By now his bilateral hip pain is definitely affecting his mobility, his quality of life and his activities day living.  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  HPI  Review of Systems He currently denies any headache, chest pain, shortness of breath, fever, chills, nausea, vomiting  Objective: Vital Signs: Ht _0  (1.753 m)   Wt 251 lb 12.8 oz (114.2 kg)   BMI 37.18 kg/m   Physical Exam He is alert and orient x3 and in no acute distress Ortho Exam Examination of both hip shows pain with internal and external rotation as well as significant stiffness with rotation so I cannot fully rotate either hip.  His leg lengths are equal. Specialty Comments:  No specialty comments available.  Imaging: No results found. X-rays in the canopy system of his pelvis and both hips show severe end-stage arthritis with complete loss of the joint space bilaterally and flattening of the femoral heads.  There are sclerotic changes as well as large periarticular osteophytes around both hip joints.  PMFS History: Patient  Active Problem List   Diagnosis Date Noted  . Unilateral primary osteoarthritis, left hip 05/19/2021  . Unilateral primary osteoarthritis, right hip 05/19/2021  . Mass of soft tissue of left upper extremity 03/30/2020   Past Medical History:  Diagnosis Date  . Arthritis   . Dry skin    on chest using goldbond lotion on  . Hypertension   . Sciatic nerve pain right comes and goes    No family history on file.  Past Surgical History:  Procedure Laterality Date  . MASS EXCISION Left 06/11/2020   Procedure: REMOVAL OF LEFT SHOULDER SUBCUTANEOUS MASS;  Surgeon: Michael Boston, MD;  Location: [place];   Service: General;  Laterality: Left;  MAC VS GENERAL (ANESTHESIA CHOICE)  . NO PAST SURGERIES     Social History   Occupational History  . Not on file  Tobacco Use  . Smoking status: Current Every Day Smoker    Packs/day: 1.00    Years: 30.00    Pack years: 30.00    Types: Cigarettes  . Smokeless tobacco: Never Used  Vaping Use  . Vaping Use: Never used  Substance and Sexual Activity  . Alcohol use: Not Currently  . Drug use: Not Currently  . Sexual activity: Not on file

## 2021-06-10 ENCOUNTER — Other Ambulatory Visit: Payer: Self-pay

## 2021-06-17 NOTE — Progress Notes (Signed)
Need orders in epic. Surgery on 06/29/21.  Proep on 06/20/21.

## 2021-06-20 ENCOUNTER — Encounter (HOSPITAL_COMMUNITY)
Admission: RE | Admit: 2021-06-20 | Discharge: 2021-06-20 | Disposition: A | Discharge status: Home / Self Care | Payer: No Typology Code available for payment source | Source: Ambulatory Visit | Attending: Orthopaedic Surgery | Admitting: Orthopaedic Surgery

## 2021-06-20 ENCOUNTER — Other Ambulatory Visit: Payer: Self-pay | Admitting: Physician Assistant

## 2021-06-20 ENCOUNTER — Other Ambulatory Visit: Payer: Self-pay

## 2021-06-20 ENCOUNTER — Encounter (HOSPITAL_COMMUNITY): Payer: Self-pay

## 2021-06-20 DIAGNOSIS — Z01818 Encounter for other preprocedural examination: Secondary | ICD-10-CM | POA: Insufficient documentation

## 2021-06-20 LAB — SURGICAL PCR SCREEN
MRSA, PCR: NEGATIVE
Staphylococcus aureus: POSITIVE — AB

## 2021-06-20 LAB — BASIC METABOLIC PANEL
Anion gap: 12 (ref 5–15)
BUN: 19 mg/dL (ref 6–20)
CO2: 26 mmol/L (ref 22–32)
Calcium: 9.4 mg/dL (ref 8.9–10.3)
Chloride: 101 mmol/L (ref 98–111)
Creatinine, Ser: 0.99 mg/dL (ref 0.61–1.24)
GFR, Estimated: >60 mL/min (ref >60)
Glucose, Bld: 117 mg/dL — ABNORMAL HIGH (ref 70–99)
Potassium: 4 mmol/L (ref 3.5–5.1)
Sodium: 139 mmol/L (ref 135–145)

## 2021-06-20 LAB — CBC
HCT: 45.4 % (ref 39.0–52.0)
Hemoglobin: 14.5 g/dL (ref 13.0–17.0)
MCH: 26.9 pg (ref 26.0–34.0)
MCHC: 31.9 g/dL (ref 30.0–36.0)
MCV: 84.2 fL (ref 80.0–100.0)
Platelets: 203 10*3/uL (ref 150–400)
RBC: 5.39 MIL/uL (ref 4.22–5.81)
RDW: 15.4 % (ref 11.5–15.5)
WBC: 9.6 10*3/uL (ref 4.0–10.5)
nRBC: 0 % (ref 0.0–0.2)

## 2021-06-20 LAB — TYPE AND SCREEN
ABO/RH(D): B POS
Antibody Screen: NEGATIVE

## 2021-06-20 NOTE — Progress Notes (Addendum)
DUE TO COVID-19 ONLY ONE VISITOR IS ALLOWED TO COME WITH YOU AND STAY IN THE WAITING ROOM ONLY DURING PRE OP AND PROCEDURE DAY OF SURGERY. THE 1 VISITOR  MAY VISIT WITH YOU AFTER SURGERY IN YOUR PRIVATE ROOM DURING VISITING HOURS ONLY!  YOU NEED TO HAVE A COVID 19 TEST ON__7/04/2021 _____ @_______ , THIS TEST MUST BE DONE BEFORE SURGERY,  COVID TESTING SITE [address] [place] , IT IS ON THE RIGHT GOING OUT [address] APPROXIMATELY  2 MINUTES PAST [employer] ON THE RIGHT. ONCE YOUR COVID TEST IS COMPLETED,  PLEASE BEGIN THE QUARANTINE INSTRUCTIONS AS OUTLINED IN YOUR HANDOUT.                TAELOR MONCADA  06/20/2021   Your procedure is scheduled on:  07/01/2021   Report to [facility] Main  Entrance   Report to admitting at    0900am      Call this number if you have problems the morning of surgery [phone]    Remember: Do not eat food , candy gum or mints :After Midnight. You may have clear liquids from midnight until  0830am  Please complete Ensure preop drink by 0830am morning of surgery.     CLEAR LIQUID DIET   Foods Allowed                                                                       Coffee and tea, regular and decaf                              Plain Jell-O any favor except red or purple                                            Fruit ices (not with fruit pulp)                                      Iced Popsicles                                     Carbonated beverages, regular and diet                                    Cranberry, grape and apple juices Sports drinks like Gatorade Lightly seasoned clear broth or consume(fat free) Sugar, honey syrup   _____________________________________________________________________    BRUSH YOUR TEETH MORNING OF SURGERY AND RINSE YOUR MOUTH OUT, NO CHEWING GUM CANDY OR MINTS.     Take these medicines the morning of surgery with A SIP OF WATER:   none   DO NOT TAKE ANY  DIABETIC MEDICATIONS DAY OF YOUR SURGERY  You may not have any metal on your body including hair pins and              piercings  Do not wear jewelry, make-up, lotions, powders or perfumes, deodorant             Do not wear nail polish on your fingernails.  Do not shave  48 hours prior to surgery.              Men may shave face and neck.   Do not bring valuables to the hospital. [place].  Contacts, dentures or bridgework may not be worn into surgery.  Leave suitcase in the car. After surgery it may be brought to your room.     Patients discharged the day of surgery will not be allowed to drive home. IF YOU ARE HAVING SURGERY AND GOING HOME THE SAME DAY, YOU MUST HAVE AN ADULT TO DRIVE YOU HOME AND BE WITH YOU FOR 24 HOURS. YOU MAY GO HOME BY TAXI OR UBER OR ORTHERWISE, BUT AN ADULT MUST ACCOMPANY YOU HOME AND STAY WITH YOU FOR 24 HOURS.  Name and phone number of your driver:  Special Instructions: N/A              Please read over the following fact sheets you were given: _____________________________________________________________________  [facility] - Preparing for Surgery Before surgery, you can play an important role.  Because skin is not sterile, your skin needs to be as free of germs as possible.  You can reduce the number of germs on your skin by washing with CHG (chlorahexidine gluconate) soap before surgery.  CHG is an antiseptic cleaner which kills germs and bonds with the skin to continue killing germs even after washing. Please DO NOT use if you have an allergy to CHG or antibacterial soaps.  If your skin becomes reddened/irritated stop using the CHG and inform your nurse when you arrive at Short Stay. Do not shave (including legs and underarms) for at least 48 hours prior to the first CHG shower.  You may shave your face/neck. Please follow these instructions carefully:  1.  Shower with CHG Soap  the night before surgery and the  morning of Surgery.  2.  If you choose to wash your hair, wash your hair first as usual with your  normal  shampoo.  3.  After you shampoo, rinse your hair and body thoroughly to remove the  shampoo.                           4.  Use CHG as you would any other liquid soap.  You can apply chg directly  to the skin and wash                       Gently with a scrungie or clean washcloth.  5.  Apply the CHG Soap to your body ONLY FROM THE NECK DOWN.   Do not use on face/ open                           Wound or open sores. Avoid contact with eyes, ears mouth and genitals (private parts).  Wash face,  Genitals (private parts) with your normal soap.             6.  Wash thoroughly, paying special attention to the area where your surgery  will be performed.  7.  Thoroughly rinse your body with warm water from the neck down.  8.  DO NOT shower/wash with your normal soap after using and rinsing off  the CHG Soap.                9.  Pat yourself dry with a clean towel.            10.  Wear clean pajamas.            11.  Place clean sheets on your bed the night of your first shower and do not  sleep with pets. Day of Surgery : Do not apply any lotions/deodorants the morning of surgery.  Please wear clean clothes to the hospital/surgery center.  FAILURE TO FOLLOW THESE INSTRUCTIONS MAY RESULT IN THE CANCELLATION OF YOUR SURGERY PATIENT SIGNATURE_________________________________  NURSE SIGNATURE__________________________________  ________________________________________________________________________

## 2021-06-20 NOTE — Progress Notes (Addendum)
Anesthesia Review:  PCP: [name]  Cardiologist : none  Chest x-ray : EKG :06/20/21  Echo :  Stress test: Cardiac Cath :  Activity level: can do a flight of stairs without difficulty  Sleep Study/ CPAP : none  Fasting Blood Sugar :      / Checks Blood Sugar -- times a day:   Blood Thinner/ Instructions /Last Dose: ASA / Instructions/ Last Dose :   Blood pressure was 182/98 at preop.  PT reports he is in pain from sciatica and left hip.  PT reports he is also nervous.   Denies any chest pain, shortness of breath or dizziness or headache at preop.

## 2021-06-21 NOTE — Progress Notes (Signed)
PCR:  positive [facility] 

## 2021-06-28 ENCOUNTER — Other Ambulatory Visit (HOSPITAL_COMMUNITY)
Admission: RE | Admit: 2021-06-28 | Discharge: 2021-06-28 | Disposition: A | Discharge status: Home / Self Care | Payer: No Typology Code available for payment source | Source: Ambulatory Visit | Attending: Orthopaedic Surgery | Admitting: Orthopaedic Surgery

## 2021-06-28 DIAGNOSIS — Z01812 Encounter for preprocedural laboratory examination: Secondary | ICD-10-CM | POA: Insufficient documentation

## 2021-06-28 DIAGNOSIS — Z20822 Contact with and (suspected) exposure to covid-19: Secondary | ICD-10-CM | POA: Diagnosis not present

## 2021-06-29 LAB — SARS CORONAVIRUS 2 (TAT 6-24 HRS): SARS Coronavirus 2: NEGATIVE

## 2021-06-30 NOTE — H&P (Signed)
TOTAL HIP ADMISSION H&P  Patient is admitted for left total hip arthroplasty.  Subjective:  Chief Complaint: left hip pain  HPI: Alan Coffey, 55 y.o. male, has a history of pain and functional disability in the left hip(s) due to arthritis and patient has failed non-surgical conservative treatments for greater than 12 weeks to include NSAID's and/or analgesics, flexibility and strengthening excercises, weight reduction as appropriate, and activity modification.  Onset of symptoms was gradual starting 1 years ago with gradually worsening course since that time.The patient noted no past surgery on the left hip(s).  Patient currently rates pain in the left hip at 10 out of 10 with activity. Patient has night pain, worsening of pain with activity and weight bearing, pain that interfers with activities of daily living, and pain with passive range of motion. Patient has evidence of subchondral sclerosis, periarticular osteophytes, and joint space narrowing by imaging studies. This condition presents safety issues increasing the risk of falls.  There is no current active infection.  Patient Active Problem List   Diagnosis Date Noted   Unilateral primary osteoarthritis, left hip 05/19/2021   Unilateral primary osteoarthritis, right hip 05/19/2021   Mass of soft tissue of left upper extremity 03/30/2020   Past Medical History:  Diagnosis Date   Arthritis    Dry skin    on chest using goldbond lotion on   Hypertension    Sciatic nerve pain right comes and goes    Past Surgical History:  Procedure Laterality Date   cyst removed left shoulder      MASS EXCISION Left 06/11/2020   Procedure: REMOVAL OF LEFT SHOULDER SUBCUTANEOUS MASS;  Surgeon: Michael Boston, MD;  Location: [place];  Service: General;  Laterality: Left;  MAC VS GENERAL (ANESTHESIA CHOICE)    No current facility-administered medications for this encounter.   Current Outpatient Medications  Medication Sig  Dispense Refill Last Dose   cyclobenzaprine (FLEXERIL) 5 MG tablet Take 5 mg by mouth 3 (three) times daily as needed for muscle spasms.      diclofenac (VOLTAREN) 75 MG EC tablet Take 1 tablet (75 mg total) by mouth 2 (two) times daily. 60 tablet 3    lisinopril-hydrochlorothiazide (ZESTORETIC) 20-12.5 MG tablet Take 1 tablet by mouth daily.      meloxicam (MOBIC) 15 MG tablet Take 15 mg by mouth daily.      traMADol (ULTRAM) 50 MG tablet Take 1-2 tablets (50-100 mg total) by mouth every 6 (six) hours as needed for moderate pain or severe pain. 20 tablet 0    UNABLE TO FIND goldbond lotion prn      No Known Allergies  Social History   Tobacco Use   Smoking status: Every Day    Packs/day: 0.50    Years: 37.00    Pack years: 18.50    Types: Cigarettes   Smokeless tobacco: Never  Substance Use Topics   Alcohol use: Never    No family history on file.   Review of Systems  Musculoskeletal:  Positive for gait problem.  All other systems reviewed and are negative.  Objective:  Physical Exam Vitals reviewed.  Constitutional:      Appearance: Normal appearance.  HENT:     Head: Normocephalic and atraumatic.  Eyes:     Extraocular Movements: Extraocular movements intact.     Pupils: Pupils are equal, round, and reactive to light.  Cardiovascular:     Rate and Rhythm: Normal rate and regular rhythm.  Pulmonary:  Effort: Pulmonary effort is normal.     Breath sounds: Normal breath sounds.  Abdominal:     Palpations: Abdomen is soft.  Musculoskeletal:     Cervical back: Normal range of motion.     Left hip: Tenderness and bony tenderness present. Decreased range of motion. Decreased strength.  Neurological:     Mental Status: He is alert and oriented to person, place, and time.  Psychiatric:        Behavior: Behavior normal.    Vital signs in last 24 hours:    Labs:   Estimated body mass index is 37.66 kg/m as calculated from the following:   Height as of 06/20/21:  _0  (1.753 m).   Weight as of 06/20/21: 115.7 kg.   Imaging Review Plain radiographs demonstrate severe degenerative joint disease of the left hip(s). The bone quality appears to be excellent for age and reported activity level.      Assessment/Plan:  End stage arthritis, left hip(s)  The patient history, physical examination, clinical judgement of the provider and imaging studies are consistent with end stage degenerative joint disease of the left hip(s) and total hip arthroplasty is deemed medically necessary. The treatment options including medical management, injection therapy, arthroscopy and arthroplasty were discussed at length. The risks and benefits of total hip arthroplasty were presented and reviewed. The risks due to aseptic loosening, infection, stiffness, dislocation/subluxation,  thromboembolic complications and other imponderables were discussed.  The patient acknowledged the explanation, agreed to proceed with the plan and consent was signed. Patient is being admitted for inpatient treatment for surgery, pain control, PT, OT, prophylactic antibiotics, VTE prophylaxis, progressive ambulation and ADL's and discharge planning.The patient is planning to be discharged home with home health services

## 2021-07-01 ENCOUNTER — Ambulatory Visit (HOSPITAL_COMMUNITY): Payer: 59 | Admitting: Physician Assistant

## 2021-07-01 ENCOUNTER — Encounter (HOSPITAL_COMMUNITY): Payer: Self-pay | Admitting: Orthopaedic Surgery

## 2021-07-01 ENCOUNTER — Ambulatory Visit (HOSPITAL_COMMUNITY): Payer: 59 | Admitting: Registered Nurse

## 2021-07-01 ENCOUNTER — Other Ambulatory Visit: Payer: Self-pay

## 2021-07-01 ENCOUNTER — Ambulatory Visit (HOSPITAL_COMMUNITY): Payer: 59

## 2021-07-01 ENCOUNTER — Observation Stay (HOSPITAL_COMMUNITY)
Admission: RE | Admit: 2021-07-01 | Discharge: 2021-07-02 | Disposition: A | Discharge status: Home / Self Care | Payer: 59 | Attending: Orthopaedic Surgery | Admitting: Orthopaedic Surgery

## 2021-07-01 ENCOUNTER — Observation Stay (HOSPITAL_COMMUNITY): Payer: 59

## 2021-07-01 ENCOUNTER — Encounter (HOSPITAL_COMMUNITY)
Admission: RE | Disposition: A | Discharge status: Home / Self Care | Payer: Self-pay | Source: Home / Self Care | Attending: Orthopaedic Surgery

## 2021-07-01 DIAGNOSIS — S82122A Displaced fracture of lateral condyle of left tibia, initial encounter for closed fracture: Secondary | ICD-10-CM

## 2021-07-01 DIAGNOSIS — F17210 Nicotine dependence, cigarettes, uncomplicated: Secondary | ICD-10-CM | POA: Insufficient documentation

## 2021-07-01 DIAGNOSIS — M1612 Unilateral primary osteoarthritis, left hip: Principal | ICD-10-CM | POA: Insufficient documentation

## 2021-07-01 DIAGNOSIS — Z96642 Presence of left artificial hip joint: Secondary | ICD-10-CM

## 2021-07-01 DIAGNOSIS — I10 Essential (primary) hypertension: Secondary | ICD-10-CM | POA: Diagnosis not present

## 2021-07-01 DIAGNOSIS — Z79899 Other long term (current) drug therapy: Secondary | ICD-10-CM | POA: Diagnosis not present

## 2021-07-01 HISTORY — PX: TOTAL HIP ARTHROPLASTY: SHX124

## 2021-07-01 LAB — ABO/RH: ABO/RH(D): B POS

## 2021-07-01 IMAGING — DX DG PORTABLE PELVIS
1 series · 1 of 1 positions shown · non-contrast
Comparison: Same day hip radiograph and radiograph [DATE]

CLINICAL DATA: Postop left hip replacement

EXAM:
PORTABLE PELVIS 1-2 VIEWS

[pelvis ap]
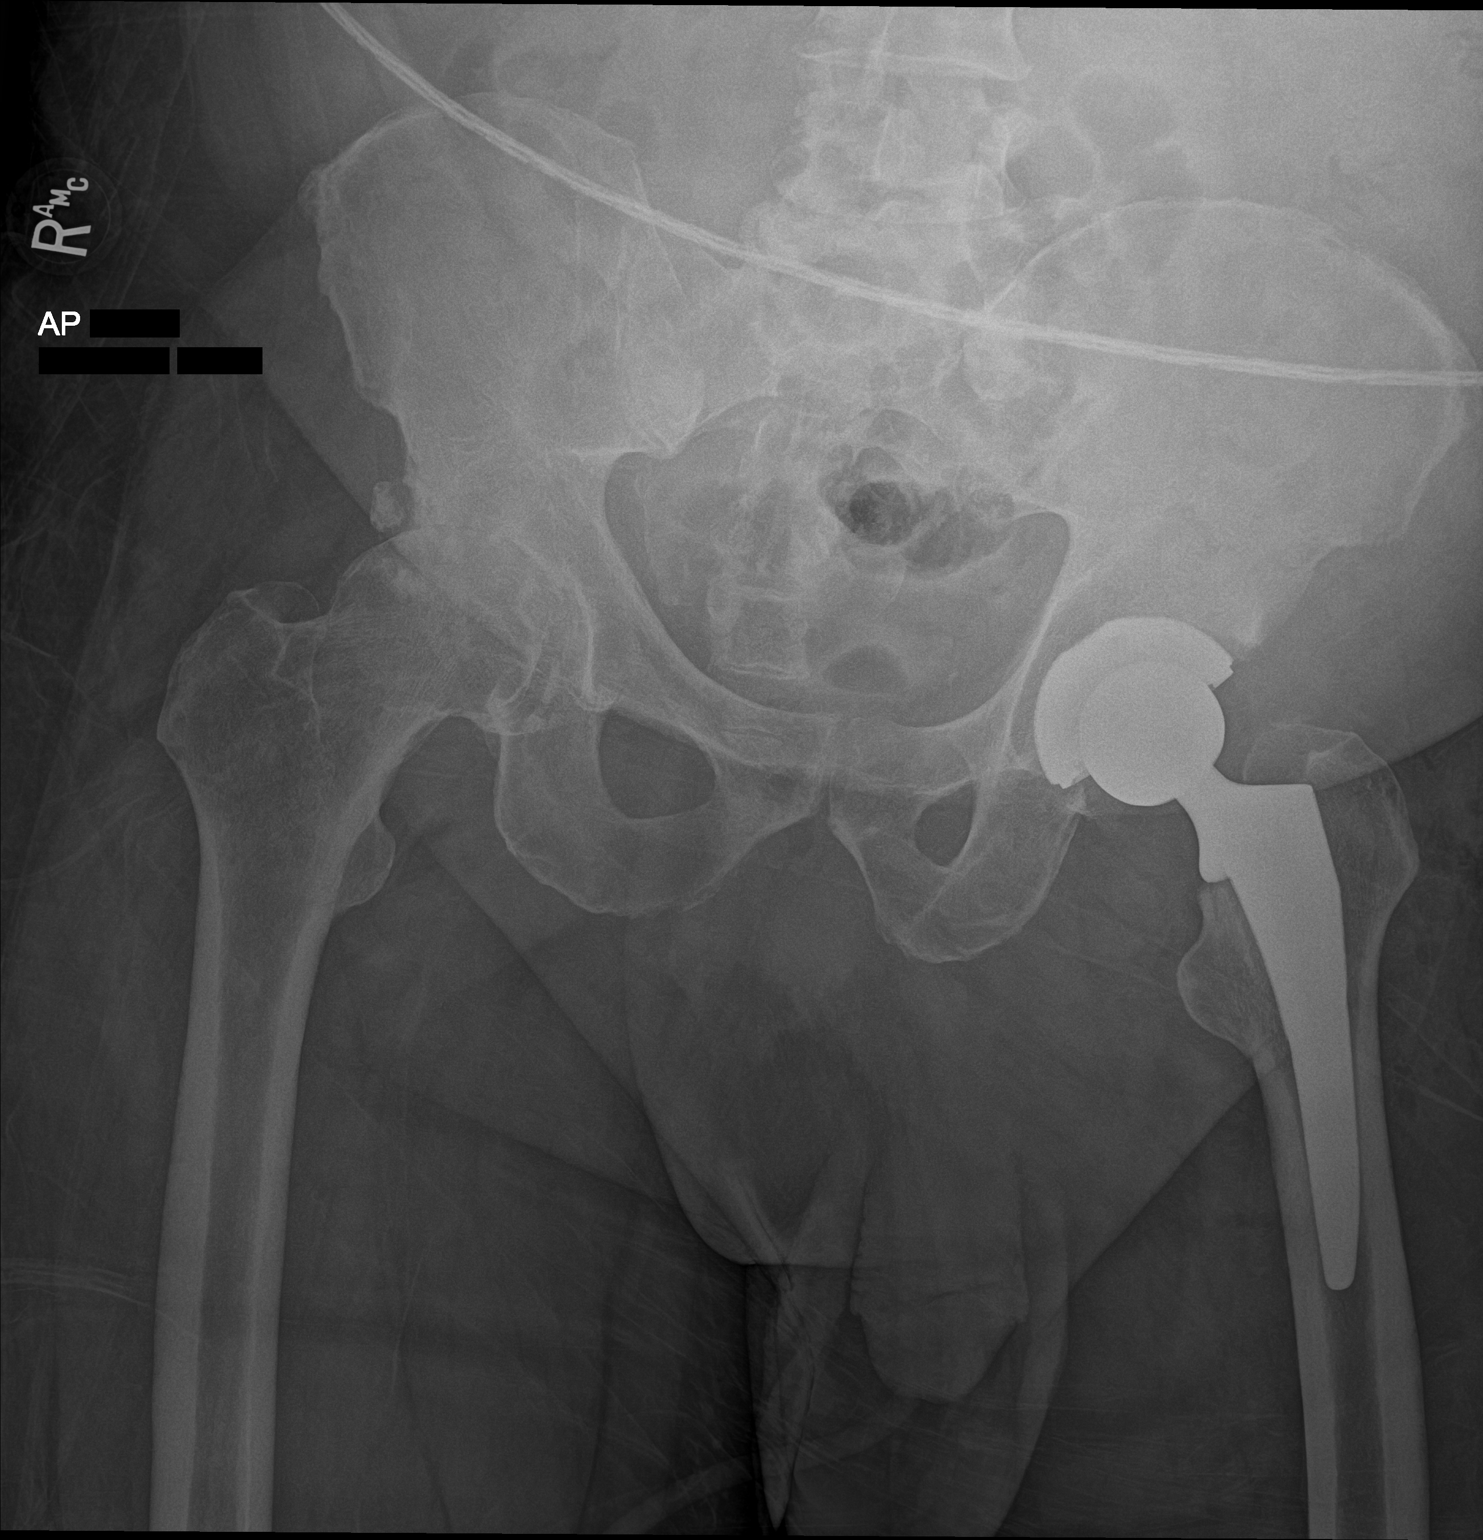

[1 of 1 positions shown; findings below may reference images not displayed]

FINDINGS: Postsurgical changes of left total hip arthroplasty, with anatomic
alignment of the hardware and no evidence of acute complication.
Severe right hip degenerative change. Lumbar spondylosis.
IMPRESSION: Postsurgical changes of left total hip arthroplasty without evidence
of acute complication.

## 2021-07-01 IMAGING — RF DG C-ARM 1-60 MIN-NO REPORT
1 series · 3 of 3 positions shown · non-contrast
Comparison: [DATE]

CLINICAL DATA: Left hip arthroplasty

EXAM:
OPERATIVE left HIP (WITH PELVIS IF PERFORMED) 2 VIEWS
TECHNIQUE: Fluoroscopic spot image(s) were submitted for interpretation
post-operatively.

[Series 1: unknown protocol · 0.20mm/px · 3 of 3 slices shown]
[im 1/3]
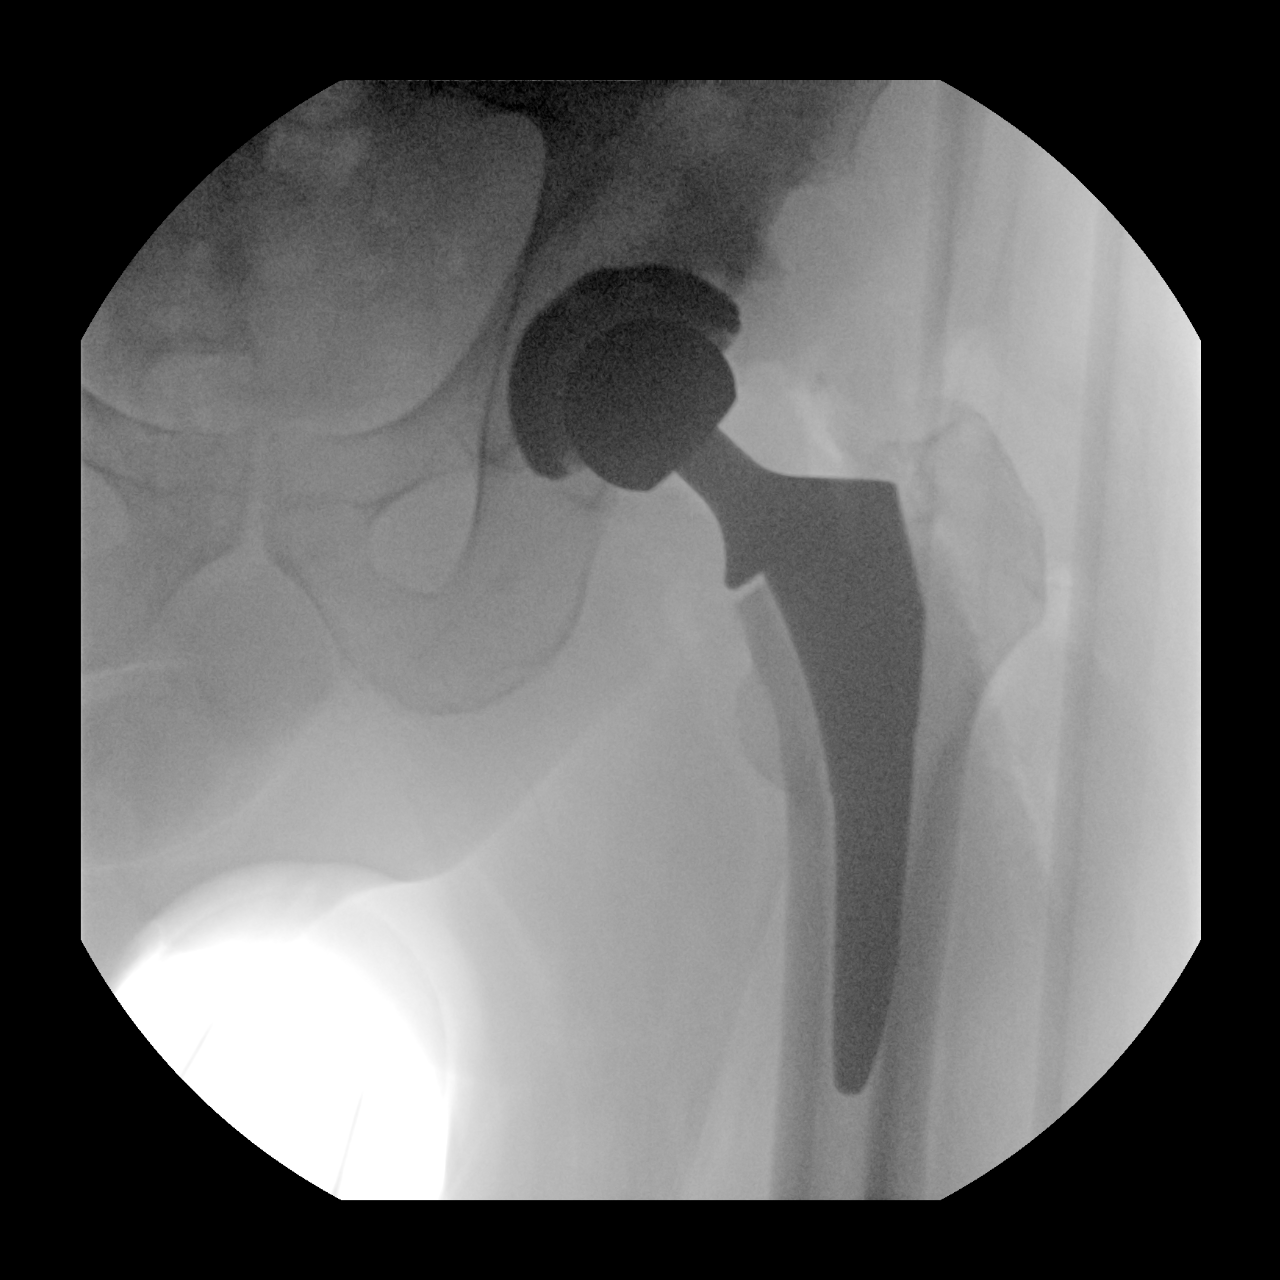
[im 2/3]
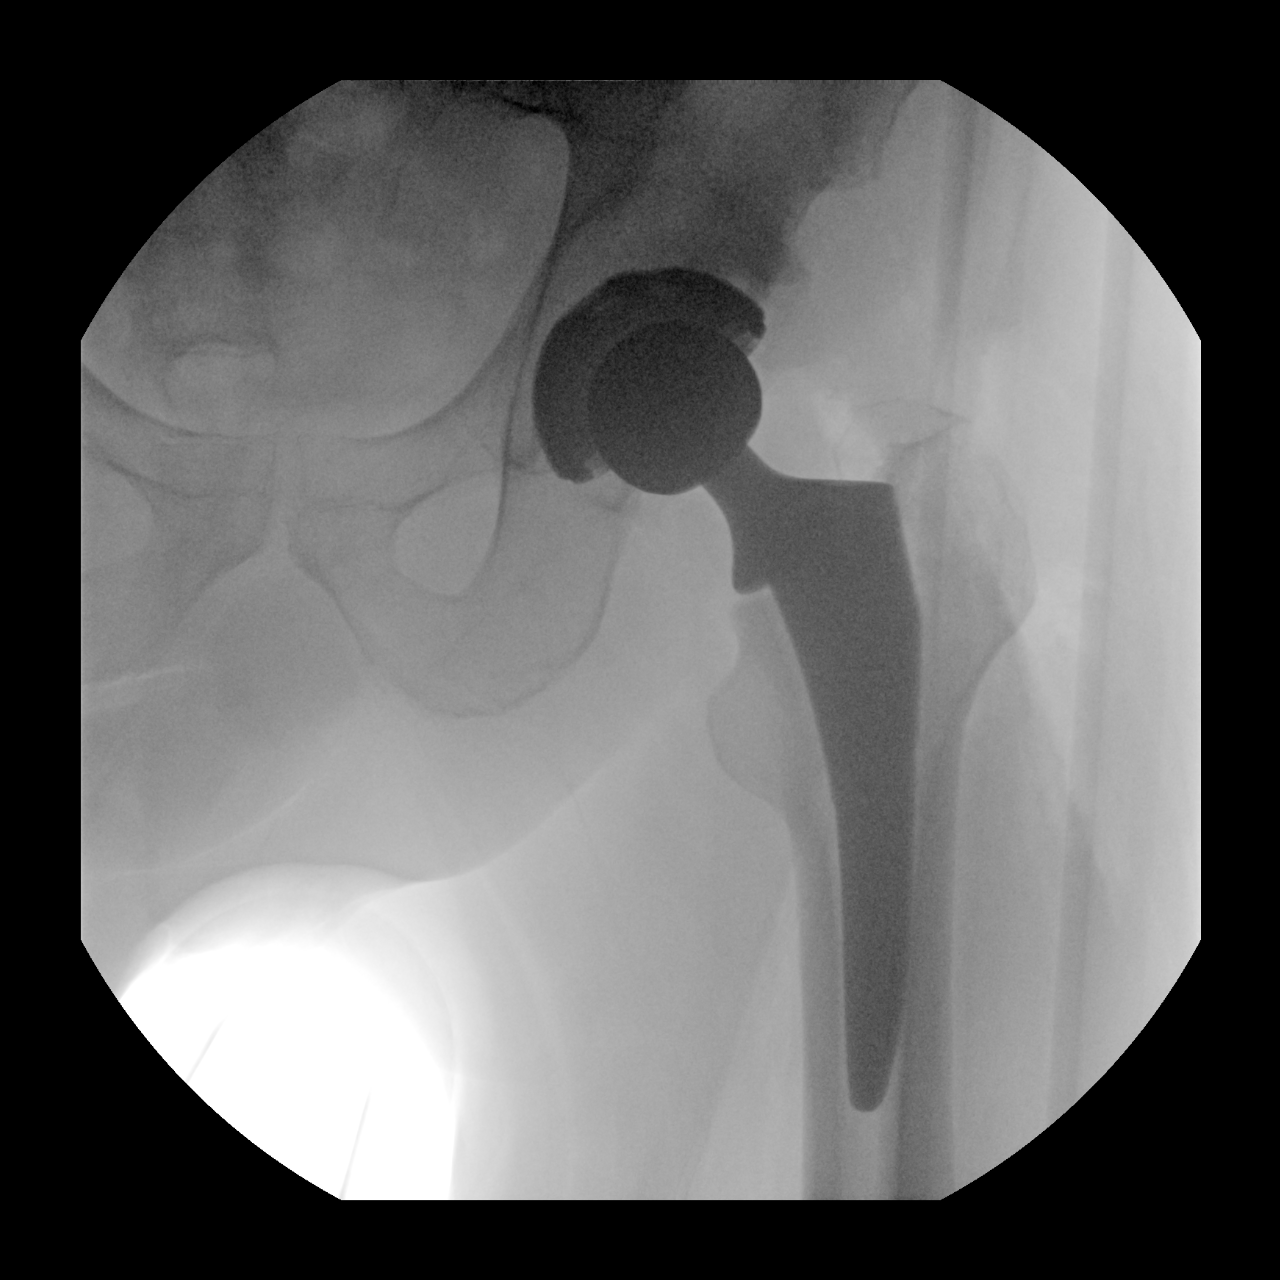
[im 3/3]
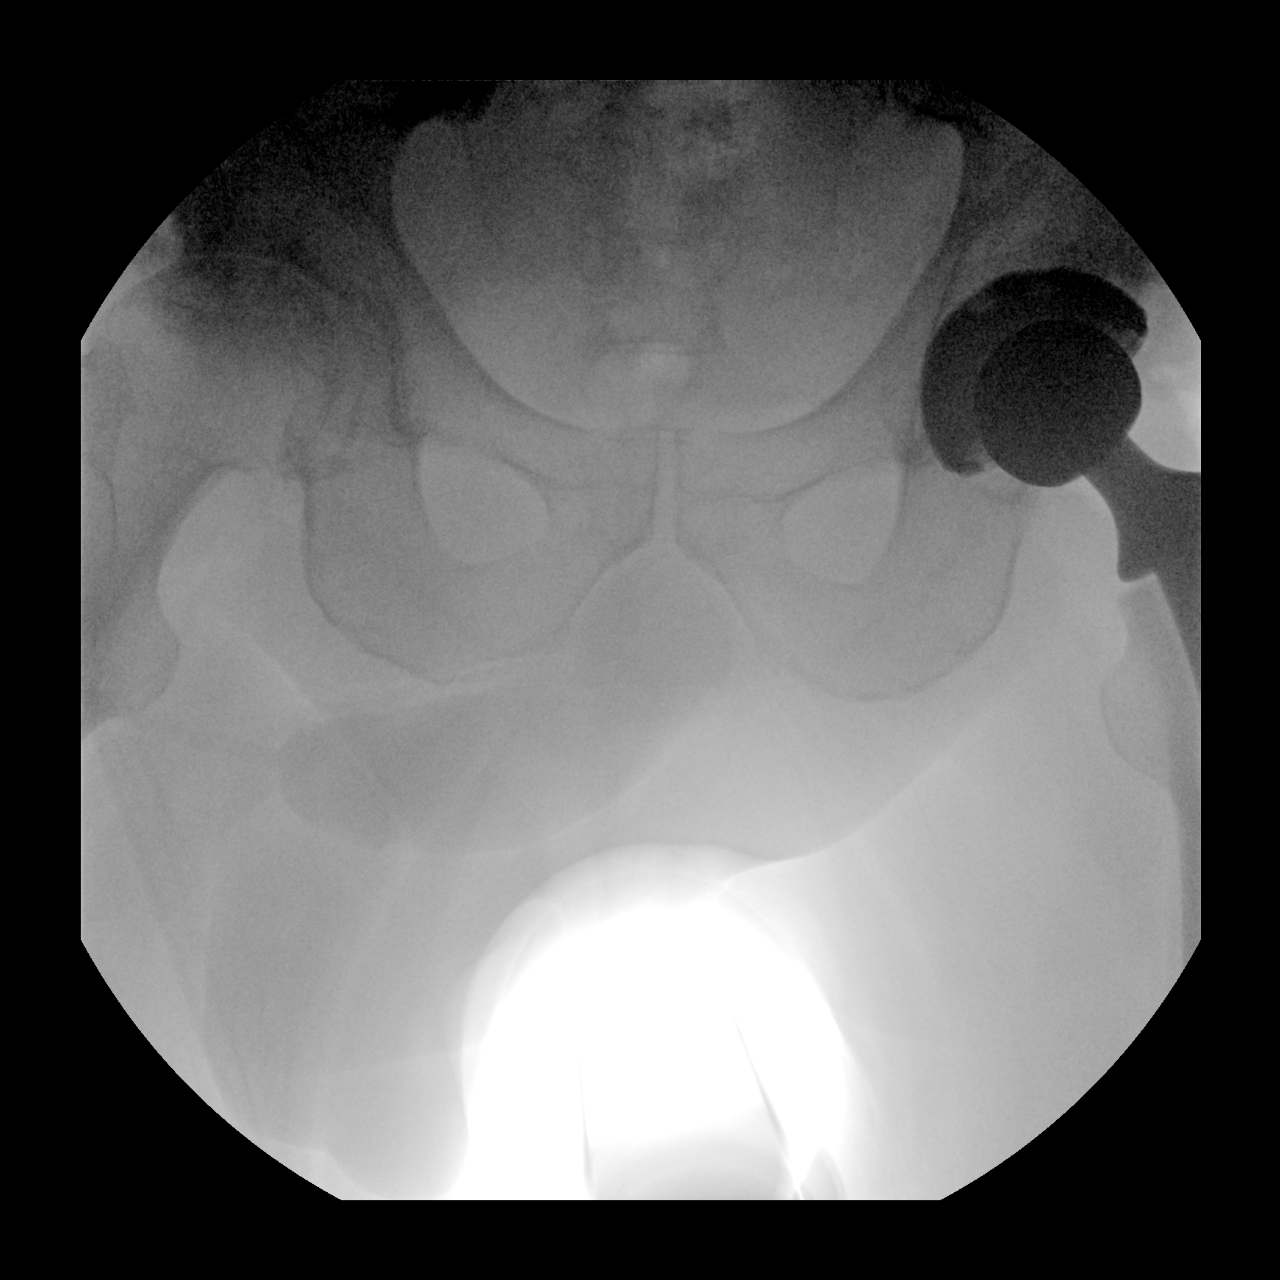

[3 of 3 positions shown; findings below may reference images not displayed]

FINDINGS: Fluoroscopic spot images demonstrate a left total hip prosthesis in
place without appreciable fracture or acute complicating feature.
There is also markedly severe degenerative right hip arthropathy.
IMPRESSION: 1. Left total hip prosthesis in place without appreciable fracture
or acute complicating feature.

## 2021-07-01 IMAGING — RF DG HIP (WITH PELVIS) OPERATIVE*L*
1 series · 3 of 3 positions shown · non-contrast
Comparison: [DATE]

CLINICAL DATA: Left hip arthroplasty

EXAM:
OPERATIVE left HIP (WITH PELVIS IF PERFORMED) 2 VIEWS
TECHNIQUE: Fluoroscopic spot image(s) were submitted for interpretation
post-operatively.

[Series 1: unknown protocol · 0.20mm/px · 3 of 3 slices shown]
[im 1/3]
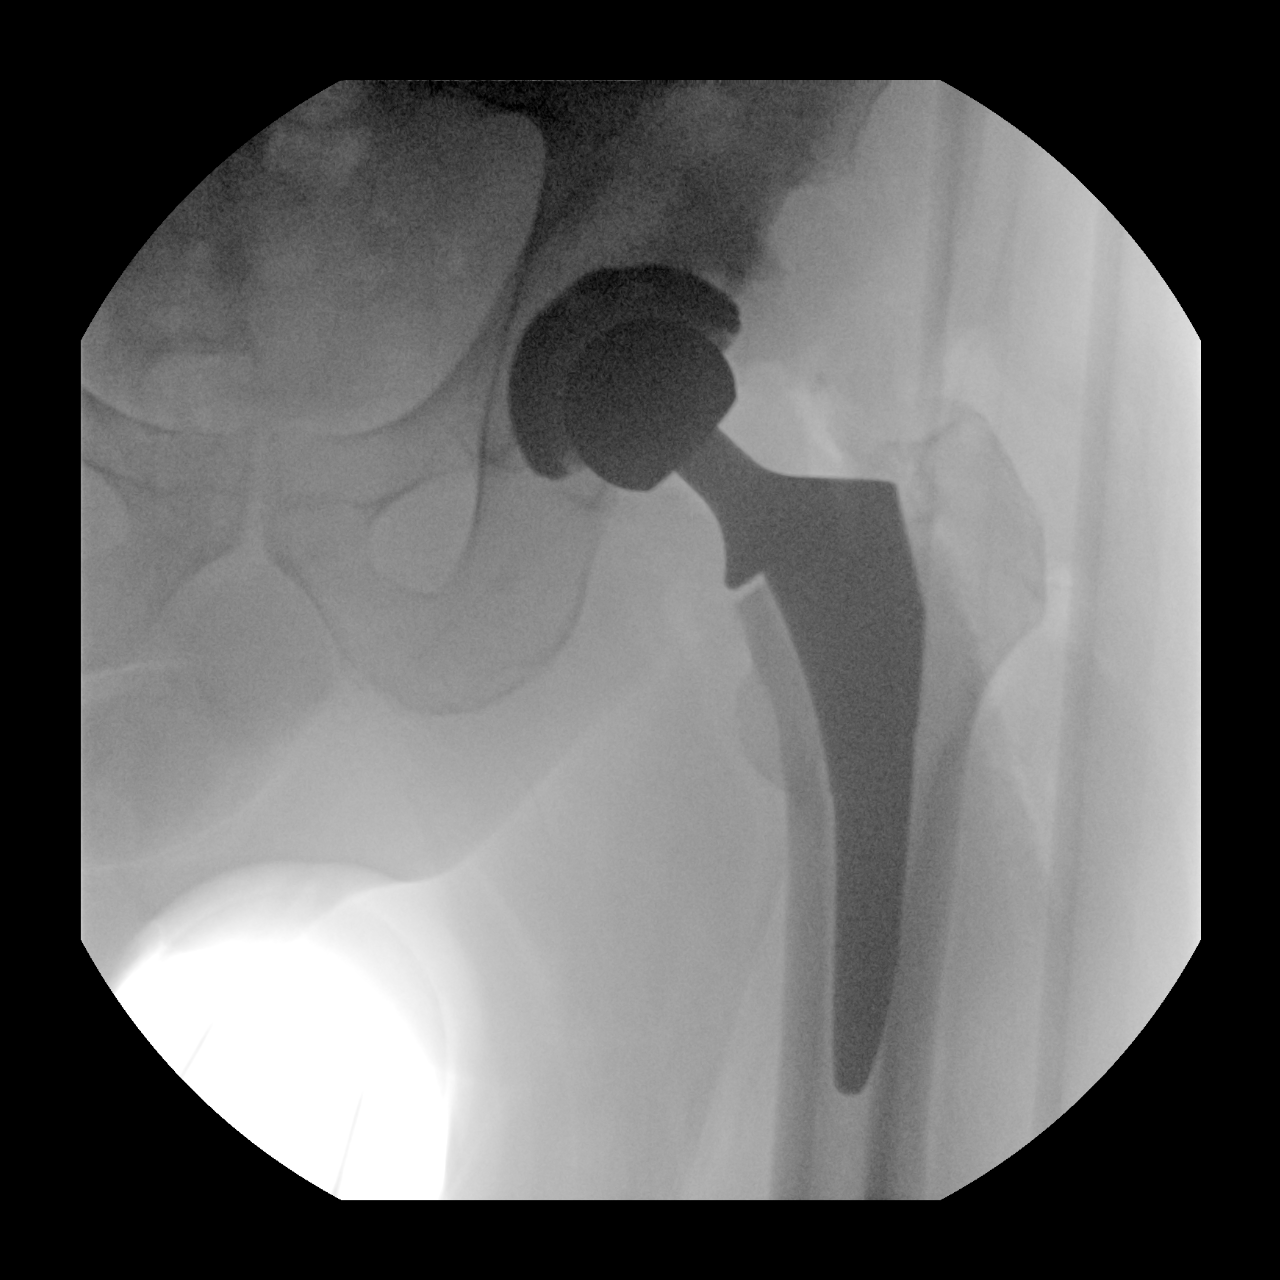
[im 2/3]
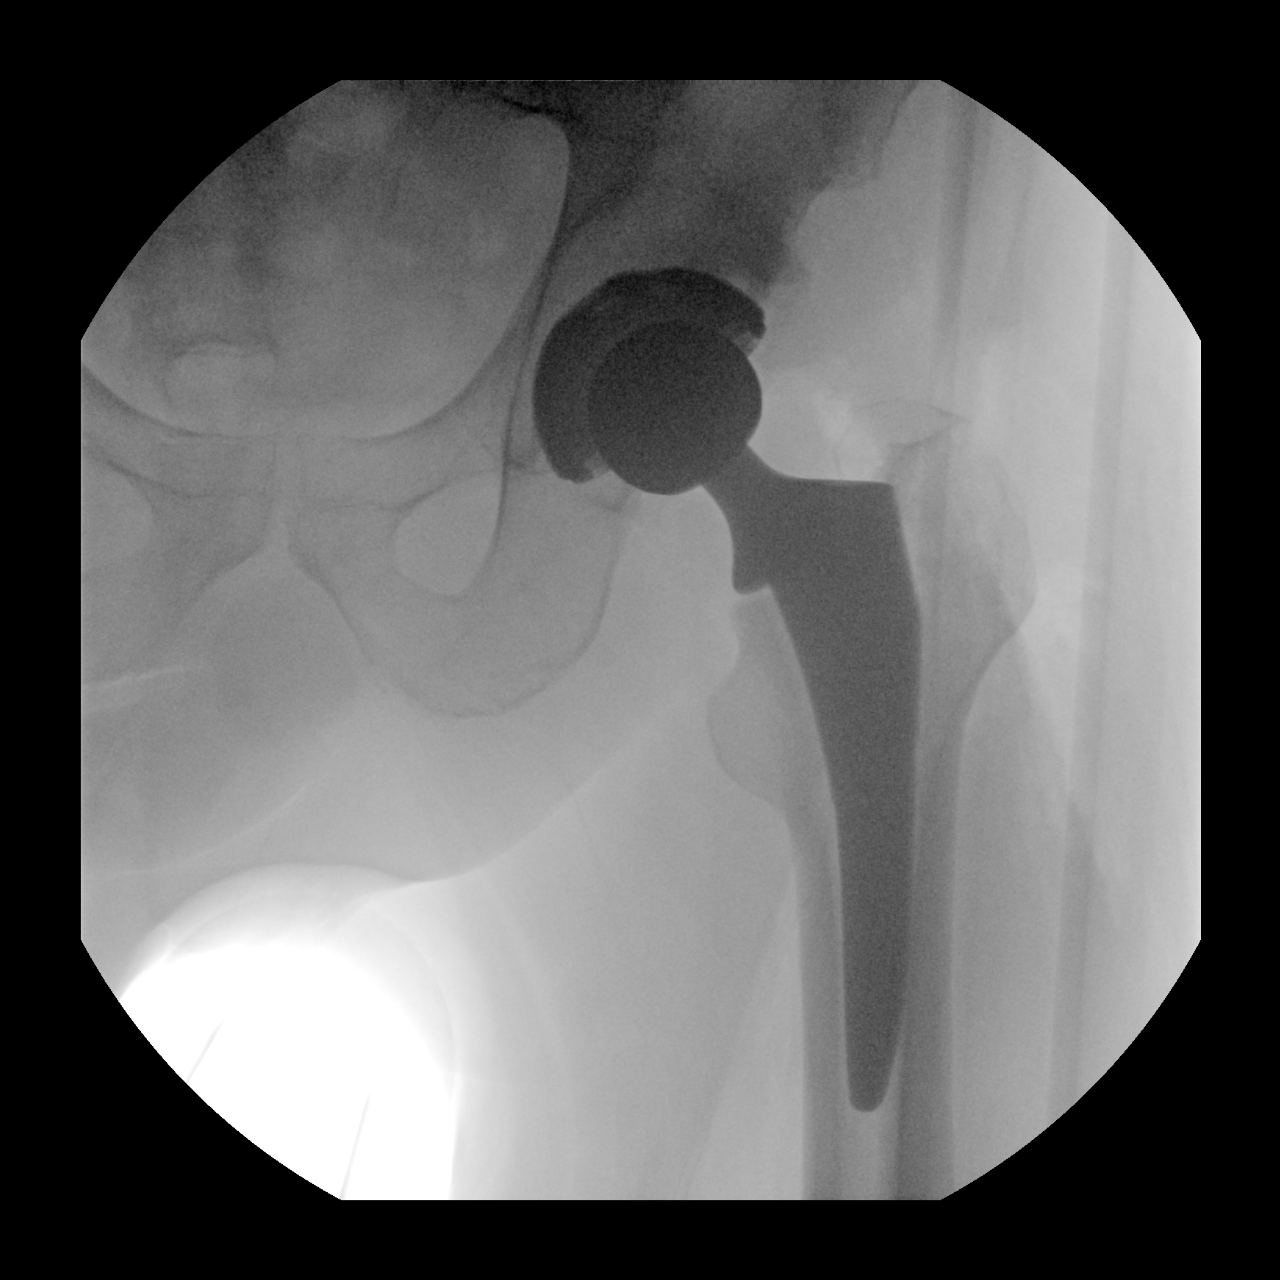
[im 3/3]
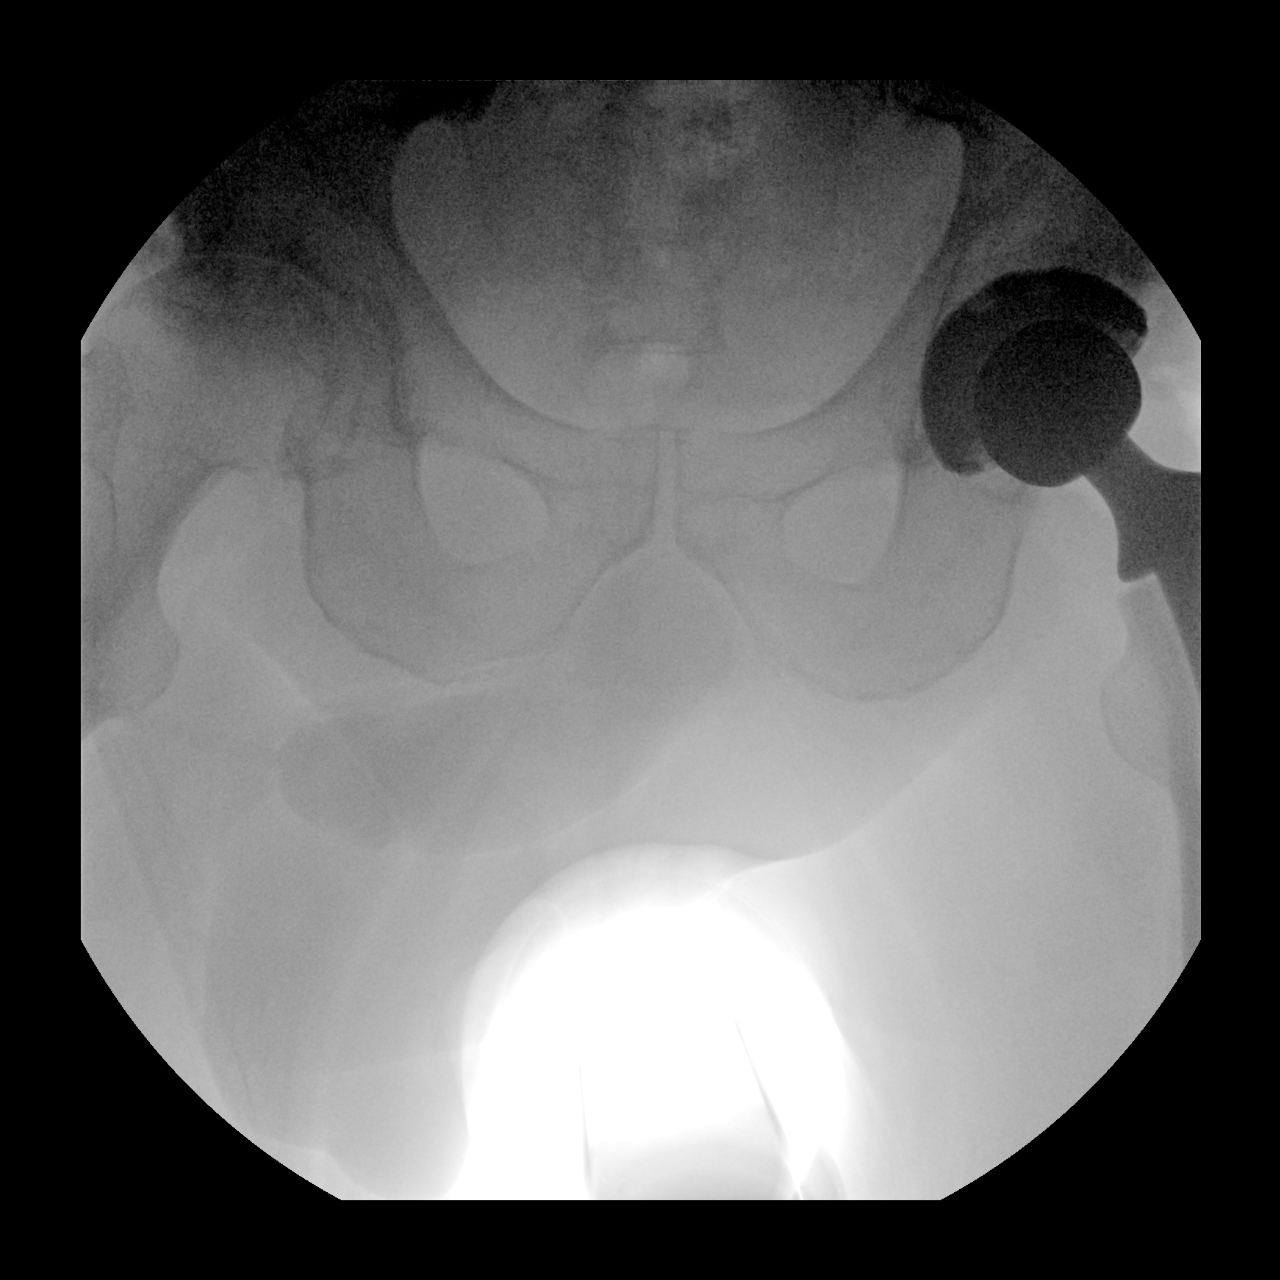

[3 of 3 positions shown; findings below may reference images not displayed]

FINDINGS: Fluoroscopic spot images demonstrate a left total hip prosthesis in
place without appreciable fracture or acute complicating feature.
There is also markedly severe degenerative right hip arthropathy.
IMPRESSION: 1. Left total hip prosthesis in place without appreciable fracture
or acute complicating feature.

## 2021-07-01 SURGERY — ARTHROPLASTY, HIP, TOTAL, ANTERIOR APPROACH
Anesthesia: Spinal | Site: Hip | Laterality: Left

## 2021-07-01 MED ORDER — OXYCODONE HCL 5 MG PO TABS
5.0000 mg | ORAL_TABLET | Freq: Once | ORAL | Status: DC | PRN
Start: 2021-07-01 — End: 2021-07-01

## 2021-07-01 MED ORDER — MIDAZOLAM HCL 2 MG/2ML IJ SOLN
INTRAMUSCULAR | Status: AC
  Filled 2021-07-01: qty 2

## 2021-07-01 MED ORDER — OXYCODONE HCL 5 MG PO TABS
10.0000 mg | ORAL_TABLET | ORAL | Status: DC | PRN
  Administered 2021-07-02: 10 mg via ORAL
  Filled 2021-07-01: qty 2

## 2021-07-01 MED ORDER — OXYCODONE HCL 5 MG/5ML PO SOLN: 5.0000 mg | Freq: Once | ORAL | Status: DC | PRN

## 2021-07-01 MED ORDER — PROPOFOL 1000 MG/100ML IV EMUL
INTRAVENOUS | Status: AC
  Filled 2021-07-01: qty 100

## 2021-07-01 MED ORDER — 0.9 % SODIUM CHLORIDE (POUR BTL) OPTIME
TOPICAL | Status: DC | PRN
  Administered 2021-07-01: 1000 mL

## 2021-07-01 MED ORDER — PROPOFOL 10 MG/ML IV BOLUS
INTRAVENOUS | Status: AC
  Filled 2021-07-01: qty 20

## 2021-07-01 MED ORDER — ALUM & MAG HYDROXIDE-SIMETH 200-200-20 MG/5ML PO SUSP: 30.0000 mL | ORAL | Status: DC | PRN

## 2021-07-01 MED ORDER — DOCUSATE SODIUM 100 MG PO CAPS
100.0000 mg | ORAL_CAPSULE | Freq: Two times a day (BID) | ORAL | Status: DC
  Administered 2021-07-01 – 2021-07-02 (×2): 100 mg via ORAL
  Filled 2021-07-01 (×2): qty 1

## 2021-07-01 MED ORDER — FENTANYL CITRATE (PF) 100 MCG/2ML IJ SOLN
INTRAMUSCULAR | Status: AC
  Filled 2021-07-01: qty 2

## 2021-07-01 MED ORDER — METHOCARBAMOL 500 MG IVPB - SIMPLE MED
500.0000 mg | Freq: Four times a day (QID) | INTRAVENOUS | Status: DC | PRN
  Filled 2021-07-01: qty 50

## 2021-07-01 MED ORDER — ONDANSETRON HCL 4 MG/2ML IJ SOLN
INTRAMUSCULAR | Status: DC | PRN
  Administered 2021-07-01: 4 mg via INTRAVENOUS

## 2021-07-01 MED ORDER — MENTHOL 3 MG MT LOZG: 1.0000 | LOZENGE | OROMUCOSAL | Status: DC | PRN

## 2021-07-01 MED ORDER — PHENYLEPHRINE HCL (PRESSORS) 10 MG/ML IV SOLN
INTRAVENOUS | Status: AC
  Filled 2021-07-01: qty 1

## 2021-07-01 MED ORDER — PHENYLEPHRINE HCL-NACL 10-0.9 MG/250ML-% IV SOLN
INTRAVENOUS | Status: DC | PRN
  Administered 2021-07-01: 35 ug/min via INTRAVENOUS

## 2021-07-01 MED ORDER — PHENOL 1.4 % MT LIQD: 1.0000 | OROMUCOSAL | Status: DC | PRN

## 2021-07-01 MED ORDER — ASPIRIN 81 MG PO CHEW
81.0000 mg | CHEWABLE_TABLET | Freq: Two times a day (BID) | ORAL | Status: DC
  Administered 2021-07-01 – 2021-07-02 (×2): 81 mg via ORAL
  Filled 2021-07-01 (×2): qty 1

## 2021-07-01 MED ORDER — AMLODIPINE BESYLATE 5 MG PO TABS
5.0000 mg | ORAL_TABLET | Freq: Every day | ORAL | Status: DC
  Administered 2021-07-01 – 2021-07-02 (×2): 5 mg via ORAL
  Filled 2021-07-01 (×2): qty 1

## 2021-07-01 MED ORDER — LISINOPRIL-HYDROCHLOROTHIAZIDE 20-12.5 MG PO TABS: 1.0000 | ORAL_TABLET | Freq: Every day | ORAL | Status: DC

## 2021-07-01 MED ORDER — METOCLOPRAMIDE HCL 5 MG/ML IJ SOLN
5.0000 mg | Freq: Three times a day (TID) | INTRAMUSCULAR | Status: DC | PRN
Start: 2021-07-01 — End: 2021-07-02

## 2021-07-01 MED ORDER — ONDANSETRON HCL 4 MG/2ML IJ SOLN
INTRAMUSCULAR | Status: AC
  Filled 2021-07-01: qty 2

## 2021-07-01 MED ORDER — DIPHENHYDRAMINE HCL 12.5 MG/5ML PO ELIX: 12.5000 mg | ORAL_SOLUTION | ORAL | Status: DC | PRN

## 2021-07-01 MED ORDER — MIDAZOLAM HCL 5 MG/5ML IJ SOLN
INTRAMUSCULAR | Status: DC | PRN
  Administered 2021-07-01: 2 mg via INTRAVENOUS

## 2021-07-01 MED ORDER — FENTANYL CITRATE (PF) 100 MCG/2ML IJ SOLN
INTRAMUSCULAR | Status: DC | PRN
  Administered 2021-07-01: 100 ug via INTRAVENOUS

## 2021-07-01 MED ORDER — SODIUM CHLORIDE 0.9 % IV SOLN: INTRAVENOUS | Status: DC

## 2021-07-01 MED ORDER — SODIUM CHLORIDE 0.9 % IR SOLN
Status: DC | PRN
  Administered 2021-07-01: 1000 mL

## 2021-07-01 MED ORDER — ORAL CARE MOUTH RINSE: 15.0000 mL | Freq: Once | OROMUCOSAL | Status: AC

## 2021-07-01 MED ORDER — TRANEXAMIC ACID-NACL 1000-0.7 MG/100ML-% IV SOLN
1000.0000 mg | INTRAVENOUS | Status: AC
  Administered 2021-07-01: 1000 mg via INTRAVENOUS
  Filled 2021-07-01: qty 100

## 2021-07-01 MED ORDER — HYDROMORPHONE HCL 1 MG/ML IJ SOLN: 0.2500 mg | INTRAMUSCULAR | Status: DC | PRN

## 2021-07-01 MED ORDER — ONDANSETRON HCL 4 MG PO TABS: 4.0000 mg | ORAL_TABLET | Freq: Four times a day (QID) | ORAL | Status: DC | PRN

## 2021-07-01 MED ORDER — PANTOPRAZOLE SODIUM 40 MG PO TBEC
40.0000 mg | DELAYED_RELEASE_TABLET | Freq: Every day | ORAL | Status: DC
  Administered 2021-07-01 – 2021-07-02 (×2): 40 mg via ORAL
  Filled 2021-07-01 (×2): qty 1

## 2021-07-01 MED ORDER — DEXAMETHASONE SODIUM PHOSPHATE 10 MG/ML IJ SOLN
INTRAMUSCULAR | Status: AC
  Filled 2021-07-01: qty 1

## 2021-07-01 MED ORDER — CEFAZOLIN SODIUM-DEXTROSE 2-4 GM/100ML-% IV SOLN
2.0000 g | INTRAVENOUS | Status: AC
  Administered 2021-07-01: 2 g via INTRAVENOUS
  Filled 2021-07-01: qty 100

## 2021-07-01 MED ORDER — PROMETHAZINE HCL 25 MG/ML IJ SOLN: 6.2500 mg | INTRAMUSCULAR | Status: DC | PRN

## 2021-07-01 MED ORDER — HYDROMORPHONE HCL 1 MG/ML IJ SOLN: 0.5000 mg | INTRAMUSCULAR | Status: DC | PRN

## 2021-07-01 MED ORDER — ACETAMINOPHEN 325 MG PO TABS
325.0000 mg | ORAL_TABLET | Freq: Four times a day (QID) | ORAL | Status: DC | PRN
Start: 2021-07-02 — End: 2021-07-02

## 2021-07-01 MED ORDER — HYDROCHLOROTHIAZIDE 12.5 MG PO CAPS
12.5000 mg | ORAL_CAPSULE | Freq: Every day | ORAL | Status: DC
  Administered 2021-07-01 – 2021-07-02 (×2): 12.5 mg via ORAL
  Filled 2021-07-01 (×2): qty 1

## 2021-07-01 MED ORDER — CEFAZOLIN SODIUM-DEXTROSE 1-4 GM/50ML-% IV SOLN
1.0000 g | Freq: Four times a day (QID) | INTRAVENOUS | Status: AC
  Administered 2021-07-01 (×2): 1 g via INTRAVENOUS
  Filled 2021-07-01 (×2): qty 50

## 2021-07-01 MED ORDER — POVIDONE-IODINE 10 % EX SWAB
2.0000 "application " | Freq: Once | CUTANEOUS | Status: AC
  Administered 2021-07-01: 2 via TOPICAL

## 2021-07-01 MED ORDER — LACTATED RINGERS IV SOLN: INTRAVENOUS | Status: DC

## 2021-07-01 MED ORDER — PROPOFOL 500 MG/50ML IV EMUL
INTRAVENOUS | Status: DC | PRN
  Administered 2021-07-01: 100 ug/kg/min via INTRAVENOUS
  Administered 2021-07-01: 75 ug/kg/min via INTRAVENOUS

## 2021-07-01 MED ORDER — PROPOFOL 10 MG/ML IV BOLUS
INTRAVENOUS | Status: DC | PRN
  Administered 2021-07-01: 40 mg via INTRAVENOUS

## 2021-07-01 MED ORDER — METOCLOPRAMIDE HCL 5 MG PO TABS: 5.0000 mg | ORAL_TABLET | Freq: Three times a day (TID) | ORAL | Status: DC | PRN

## 2021-07-01 MED ORDER — ONDANSETRON HCL 4 MG/2ML IJ SOLN: 4.0000 mg | Freq: Four times a day (QID) | INTRAMUSCULAR | Status: DC | PRN

## 2021-07-01 MED ORDER — STERILE WATER FOR IRRIGATION IR SOLN
Status: DC | PRN
  Administered 2021-07-01: 2000 mL

## 2021-07-01 MED ORDER — METHOCARBAMOL 500 MG PO TABS
500.0000 mg | ORAL_TABLET | Freq: Four times a day (QID) | ORAL | Status: DC | PRN
  Administered 2021-07-02 (×3): 500 mg via ORAL
  Filled 2021-07-01 (×3): qty 1

## 2021-07-01 MED ORDER — OXYCODONE HCL 5 MG PO TABS
5.0000 mg | ORAL_TABLET | ORAL | Status: DC | PRN
  Administered 2021-07-01: 5 mg via ORAL
  Administered 2021-07-02: 10 mg via ORAL
  Administered 2021-07-02: 5 mg via ORAL
  Filled 2021-07-01: qty 1
  Filled 2021-07-01 (×2): qty 2

## 2021-07-01 MED ORDER — DEXAMETHASONE SODIUM PHOSPHATE 10 MG/ML IJ SOLN
INTRAMUSCULAR | Status: DC | PRN
  Administered 2021-07-01: 10 mg via INTRAVENOUS

## 2021-07-01 MED ORDER — GLYCOPYRROLATE 0.2 MG/ML IJ SOLN
INTRAMUSCULAR | Status: DC | PRN
  Administered 2021-07-01: .1 mg via INTRAVENOUS

## 2021-07-01 MED ORDER — LISINOPRIL 20 MG PO TABS
20.0000 mg | ORAL_TABLET | Freq: Every day | ORAL | Status: DC
  Administered 2021-07-01 – 2021-07-02 (×2): 20 mg via ORAL
  Filled 2021-07-01 (×2): qty 1

## 2021-07-01 MED ORDER — CHLORHEXIDINE GLUCONATE 0.12 % MT SOLN
15.0000 mL | Freq: Once | OROMUCOSAL | Status: AC
  Administered 2021-07-01: 15 mL via OROMUCOSAL

## 2021-07-01 MED ORDER — BUPIVACAINE IN DEXTROSE 0.75-8.25 % IT SOLN
INTRATHECAL | Status: DC | PRN
  Administered 2021-07-01: 2 mL via INTRATHECAL

## 2021-07-01 SURGICAL SUPPLY — 39 items
ACETAB CUP W/GRIPTION 54 (Plate) ×2 IMPLANT
BAG COUNTER SPONGE SURGICOUNT (BAG) ×2 IMPLANT
BAG ZIPLOCK 12X15 (MISCELLANEOUS) IMPLANT
BENZOIN TINCTURE PRP APPL 2/3 (GAUZE/BANDAGES/DRESSINGS) IMPLANT
BLADE SAW SGTL 18X1.27X75 (BLADE) ×2 IMPLANT
COVER PERINEAL POST (MISCELLANEOUS) ×2 IMPLANT
COVER SURGICAL LIGHT HANDLE (MISCELLANEOUS) ×2 IMPLANT
CUP ACETAB W/GRIPTION 54 (Plate) ×1 IMPLANT
DRAPE FOOT SWITCH (DRAPES) ×2 IMPLANT
DRAPE STERI IOBAN 125X83 (DRAPES) ×2 IMPLANT
DRAPE U-SHAPE 47X51 STRL (DRAPES) ×4 IMPLANT
DRSG AQUACEL AG ADV 3.5X10 (GAUZE/BANDAGES/DRESSINGS) ×2 IMPLANT
DURAPREP 26ML APPLICATOR (WOUND CARE) ×2 IMPLANT
ELECT REM PT RETURN 15FT ADLT (MISCELLANEOUS) ×2 IMPLANT
GAUZE XEROFORM 1X8 LF (GAUZE/BANDAGES/DRESSINGS) ×2 IMPLANT
GLOVE SRG 8 PF TXTR STRL LF DI (GLOVE) ×2 IMPLANT
GLOVE SURG ENC MOIS LTX SZ7.5 (GLOVE) ×2 IMPLANT
GLOVE SURG LTX SZ8 (GLOVE) ×2 IMPLANT
GLOVE SURG UNDER POLY LF SZ8 (GLOVE) ×4
GOWN STRL REUS W/TWL XL LVL3 (GOWN DISPOSABLE) ×4 IMPLANT
HANDPIECE INTERPULSE COAX TIP (DISPOSABLE) ×2
HEAD CERAMIC 36 PLUS5 (Hips) ×2 IMPLANT
HOLDER FOLEY CATH W/STRAP (MISCELLANEOUS) ×2 IMPLANT
KIT TURNOVER KIT A (KITS) ×2 IMPLANT
LINER NEUTRAL 54X36MM PLUS 4 (Hips) ×2 IMPLANT
PACK ANTERIOR HIP CUSTOM (KITS) ×2 IMPLANT
PENCIL SMOKE EVACUATOR (MISCELLANEOUS) IMPLANT
SET HNDPC FAN SPRY TIP SCT (DISPOSABLE) ×1 IMPLANT
STAPLER VISISTAT 35W (STAPLE) IMPLANT
STEM FEMORAL SZ6 HIGH ACTIS (Stem) ×2 IMPLANT
STRIP CLOSURE SKIN 1/2X4 (GAUZE/BANDAGES/DRESSINGS) IMPLANT
SUT ETHIBOND NAB CT1 #1 30IN (SUTURE) ×2 IMPLANT
SUT ETHILON 2 0 PS N (SUTURE) IMPLANT
SUT MNCRL AB 4-0 PS2 18 (SUTURE) IMPLANT
SUT VIC AB 0 CT1 36 (SUTURE) ×2 IMPLANT
SUT VIC AB 1 CT1 36 (SUTURE) ×2 IMPLANT
SUT VIC AB 2-0 CT1 27 (SUTURE) ×4
SUT VIC AB 2-0 CT1 TAPERPNT 27 (SUTURE) ×2 IMPLANT
TRAY FOLEY MTR SLVR 16FR STAT (SET/KITS/TRAYS/PACK) IMPLANT

## 2021-07-01 NOTE — Op Note (Signed)
NAME: Alan Coffey, Alan Coffey MEDICAL RECORD NO: [id number] ACCOUNT NO: [id number] DATE OF BIRTH: 1966/03/28 FACILITY: [facility] LOCATION: [id number] PHYSICIAN: Vanita Panda. Magnus Ivan, MD  Operative Report   DATE OF PROCEDURE: 07/01/2021  PREOPERATIVE DIAGNOSIS:  Primary osteoarthritis and degenerative joint disease, left hip.  POSTOPERATIVE DIAGNOSIS:  Primary osteoarthritis and degenerative joint disease, left hip.  PROCEDURE:  Left total hip arthroplasty through direct anterior approach.  IMPLANTS:  DePuy sector Gription acetabular component, size 54, size 36+4 neutral polyethylene liner, size 6 ACTIS femoral component with high offset, size 36+5 ceramic hip ball.  SURGEON:  Vanita Panda. Magnus Ivan, MD  ASSISTANT:  Richardean Canal, PA-C.  ANESTHESIA:  Spinal.  ANTIBIOTICS:  2 g IV Ancef  BLOOD LOSS:  200 mL.  COMPLICATIONS:  None.  INDICATIONS:  The patient is a very pleasant 55 year old gentleman with an unfortunate debilitating arthritis involving both his hips.  There has been well documented x-rays and has flattening of the femoral head on the left side and the right side and  severe end-stage arthritis in both hips.  His left hurts much worse than the right.  At this point, he has tried and failed all forms of conservative treatment.  His left hip pain is daily and it is detrimentally affecting his mobility, his quality of  life and his activities of daily living to the point he does wish to proceed with a total hip arthroplasty.  We had a long and thorough discussion about the risks and benefits of the surgery.  We talked about the risk of acute blood loss anemia, nerve or  vessel injury, fracture, infection, dislocation, DVT, implant failure and skin and soft tissue issues.  We talked about our goals being decreased pain, improved mobility and overall improve quality of life.  DESCRIPTION OF PROCEDURE:  After informed consent was obtained, appropriate left hip was marked, he was  brought to the operating room and sat up on the stretcher where spinal anesthesia was obtained.  He was laid in supine position on the stretcher.   Foley catheter was placed and traction boots were placed on both his feet.  Next, he was placed supine on the Hana fracture table, the perineal post in place and both legs in line skeletal traction device and no traction applied.  His left operative hip  was prepped and draped with DuraPrep and sterile drapes.  A timeout was called.  He was identified correct patient, correct left hip.  I then made an incision just inferior and posterior to the anterior superior iliac spine and carried this obliquely  down the leg.  We dissected down tensor fascia lata muscle.  Tensor fascia was then divided longitudinally to proceed with direct anterior approach to the hip.  We identified and cauterized circumflex vessels and then identified the hip capsule.  We  opened the hip capsule in L-type format finding moderate joint effusion.  We placed Cobra retractors within the joint capsule around the medial and lateral femoral neck and made a femoral neck cut with the oscillating saw just proximal to the lesser  trochanter.  We completed this with an osteotome.  We placed a corkscrew in the femoral head and removed the femoral head its entirety and found a wide area devoid of cartilage and certainly flattening.  I then placed a bent Hohmann over the medial  acetabular rim and removed remnants of the acetabular labrum and other debris.  We then began reaming under direct visualization from a size 43 reamer in a stepwise increments  going up to a size 53 with all reamers placed under direct visualization, the  last reamer was also placed under direct fluoroscopy, so I could obtain our depth in reaming, my inclination and anteversion.  I then placed the real DePuy sector Gription acetabular component, size 54.  We went with a 36+4 polyethylene liner for that  size acetabular component  based off his offset.  Attention was then turned to the femur with the leg externally rotated to 120 degrees and extended and adduct.  We are able to place a Mueller retractor medially and Hohman retractor behind the greater  trochanter.  We released lateral joint capsule and used a box cutting osteotome to enter the femoral canal.  We then began broaching using the ACTIS broaching system from a size 0 going up to a size 6, with a size 6 in place we trialed standard offset  femoral neck and a 36+1.5 hip ball.  This reduced easily into the pelvis.  Right away, we could see that we needed more offset in leg length.  We dislocated the hip, removed the trial components.  We placed the real high offset ACTIS femoral component,  size 6 and we went with a 36+5 ceramic hip ball, reduced this in the acetabulum and it was stable.  We assessed it radiographically and mechanically and we placed in the lateral leg through range of motion and stability in general.  We irrigated the soft  tissue again with normal saline solution.  We closed the joint capsule with interrupted #1 Ethibond suture followed by #1 Vicryl to close the tensor fascia.  0 Vicryl was used to close the deep tissue and 2-0 Vicryl was used to close the subcutaneous  tissue.  The skin was closed with staples.  Aquacel dressing was applied.  He was taken off the Hana table and taken to recovery in stable condition with all final counts being correct.  No complications noted.  Of note, Rexene Edison, PA-C did assist from  the entire case from the beginning to the end and his assistance was crucial for facilitating every aspect of this case.   PUS D: 07/01/2021 1:33:52 pm T: 07/01/2021 6:07:00 pm  JOB: [id number]/ [id number]

## 2021-07-01 NOTE — Brief Op Note (Signed)
07/01/2021  1:35 PM  PATIENT:  Alan Coffey  55 y.o. male  PRE-OPERATIVE DIAGNOSIS:  osteoarthritis left hip  POST-OPERATIVE DIAGNOSIS:  osteoarthritis left hip  PROCEDURE:  Procedure(s): LEFT TOTAL HIP ARTHROPLASTY ANTERIOR APPROACH (Left)  SURGEON:  Surgeon(s) and Role:    Kathryne Hitch, MD - Primary  PHYSICIAN ASSISTANT:  Rexene Edison, PA-C  ANESTHESIA:   spinal  EBL:  200 mL   COUNTS:  YES  DICTATION: .Other Dictation: Dictation Number [id number]  PLAN OF CARE: Admit for overnight observation  PATIENT DISPOSITION:  PACU - hemodynamically stable.   Delay start of Pharmacological VTE agent (>24hrs) due to surgical blood loss or risk of bleeding: no

## 2021-07-01 NOTE — Transfer of Care (Signed)
Immediate Anesthesia Transfer of Care Note  Patient: Alan Coffey  Procedure(s) Performed: LEFT TOTAL HIP ARTHROPLASTY ANTERIOR APPROACH (Left: Hip)  Patient Location: PACU  Anesthesia Type:Spinal  Level of Consciousness: sedated and patient cooperative  Airway & Oxygen Therapy: Patient Spontanous Breathing and Patient connected to face mask oxygen  Post-op Assessment: Report given to RN and Post -op Vital signs reviewed and stable  Post vital signs: stable  Last Vitals:  Vitals Value Taken Time  BP 88/59 07/01/21 1352  Temp    Pulse 70 07/01/21 1355  Resp 12 07/01/21 1355  SpO2 98 % 07/01/21 1355  Vitals shown include unvalidated device data.  Last Pain:  Vitals:   07/01/21 0938  PainSc: 0-No pain         Complications: No notable events documented.

## 2021-07-01 NOTE — Anesthesia Preprocedure Evaluation (Signed)
Anesthesia Evaluation  Patient identified by MRN, date of birth, ID band Patient awake    Reviewed: Allergy & Precautions, NPO status , Patient's Chart, lab work & pertinent test results  Airway Mallampati: II  TM Distance: >3 FB Neck ROM: Full    Dental no notable dental hx.    Pulmonary neg pulmonary ROS, Current Smoker and Patient abstained from smoking.,    Pulmonary exam normal breath sounds clear to auscultation       Cardiovascular hypertension, Pt. on medications negative cardio ROS Normal cardiovascular exam Rhythm:Regular Rate:Normal     Neuro/Psych negative neurological ROS  negative psych ROS   GI/Hepatic negative GI ROS, Neg liver ROS,   Endo/Other  negative endocrine ROS  Renal/GU negative Renal ROS  negative genitourinary   Musculoskeletal negative musculoskeletal ROS (+) Arthritis , Osteoarthritis,    Abdominal (+) + obese,   Peds negative pediatric ROS (+)  Hematology negative hematology ROS (+)   Anesthesia Other Findings   Reproductive/Obstetrics negative OB ROS                             Anesthesia Physical  Anesthesia Plan  ASA: 2  Anesthesia Plan: Spinal   Post-op Pain Management:    Induction: Intravenous  PONV Risk Score and Plan: 0 and Treatment may vary due to age or medical condition  Airway Management Planned: Simple Face Mask  Additional Equipment:   Intra-op Plan:   Post-operative Plan:   Informed Consent:     Dental advisory given  Plan Discussed with: CRNA and Anesthesiologist  Anesthesia Plan Comments:         Anesthesia Quick Evaluation

## 2021-07-01 NOTE — Anesthesia Postprocedure Evaluation (Signed)
Anesthesia Post Note  Patient: JOON POHLE  Procedure(s) Performed: LEFT TOTAL HIP ARTHROPLASTY ANTERIOR APPROACH (Left: Hip)     Patient location during evaluation: PACU Anesthesia Type: Spinal Level of consciousness: awake and alert Pain management: pain level controlled Vital Signs Assessment: post-procedure vital signs reviewed and stable Respiratory status: spontaneous breathing, nonlabored ventilation and respiratory function stable Cardiovascular status: blood pressure returned to baseline and stable Postop Assessment: no apparent nausea or vomiting Anesthetic complications: no   No notable events documented.  Last Vitals:  Vitals:   07/01/21 1500 07/01/21 1524  BP: (!) 124/95 (!) 136/96  Pulse: 71 73  Resp: 10 16  Temp:  36.5 C  SpO2: 97% 98%    Last Pain:  Vitals:   07/01/21 1524  TempSrc: Oral  PainSc:                  Lowella Curb

## 2021-07-01 NOTE — Interval H&P Note (Signed)
History and Physical Interval Note: The patient understands that he is here today for a left total hip replacement to treat his left hip osteoarthritis.  There is been no acute or interval change in his medical status.  Please see recent H&P.  The risks and benefits of surgery been discussed in detail and informed consent is obtained.  The left hip has been marked.  07/01/2021 10:47 AM  Gwendolyn Fill  has presented today for surgery, with the diagnosis of osteoarthritis left hip.  The various methods of treatment have been discussed with the patient and family. After consideration of risks, benefits and other options for treatment, the patient has consented to  Procedure(s): LEFT TOTAL HIP ARTHROPLASTY ANTERIOR APPROACH (Left) as a surgical intervention.  The patient's history has been reviewed, patient examined, no change in status, stable for surgery.  I have reviewed the patient's chart and labs.  Questions were answered to the patient's satisfaction.     Kathryne Hitch

## 2021-07-01 NOTE — Anesthesia Procedure Notes (Signed)
Procedure Name: MAC Date/Time: 07/01/2021 12:12 PM Performed by: Lissa Morales, CRNA Pre-anesthesia Checklist: Patient identified, Emergency Drugs available, Suction available, Patient being monitored and Timeout performed Patient Re-evaluated:Patient Re-evaluated prior to induction Oxygen Delivery Method: Simple face mask Placement Confirmation: positive ETCO2

## 2021-07-01 NOTE — Anesthesia Procedure Notes (Signed)
Spinal  Patient location during procedure: OR End time: 07/01/2021 12:18 PM Reason for block: surgical anesthesia Staffing Performed: anesthesiologist and resident/CRNA  Resident/CRNA: Lissa Morales, CRNA Preanesthetic Checklist Completed: patient identified, IV checked, risks and benefits discussed, surgical consent, monitors and equipment checked, pre-op evaluation and timeout performed Spinal Block Patient position: sitting Prep: DuraPrep Patient monitoring: heart rate, continuous pulse ox and blood pressure Approach: midline Location: L3-4 Injection technique: single-shot Needle Needle type: Pencan  Needle gauge: 24 G Needle length: 9 cm Assessment Events: CSF return Additional Notes Patient tolerated procedure well. Spinal kit dates checked and within date.

## 2021-07-01 NOTE — Evaluation (Signed)
Physical Therapy Evaluation Patient Details Name: Alan Coffey MRN: [id number] DOB: 09-Apr-1966 Today's Date: 07/01/2021   History of Present Illness  pt s/p L DA THA 07/01/2021.  Clinical Impression  Pt is s/p THA resulting in the deficits listed below (see PT Problem List).  Pt will benefit from acute PT to increase their independence and safety with mobility to allow discharge home. Pt tolerated mobility and short ambulation distance with evening with RW. Feel pt will progress well with potential to DC home tomorrow.      Follow Up Recommendations Follow surgeon's recommendation for DC plan and follow-up therapies    Equipment Recommendations  None recommended by PT (pt stated he already has a RW)    Recommendations for Other Services       Precautions / Restrictions Precautions Precautions: None      Mobility  Bed Mobility Overal bed mobility: Needs Assistance Bed Mobility: Supine to Sit;Sit to Supine     Supine to sit: Min assist Sit to supine: Min assist   General bed mobility comments: cues for sequencing and assist with LE    Transfers Overall transfer level: Needs assistance Equipment used: Rolling walker (2 wheeled) Transfers: Sit to/from Stand Sit to Stand: Min guard         General transfer comment: cues for RW safety  Ambulation/Gait Ambulation/Gait assistance: Min guard Gait Distance (Feet): 25 Feet (limited to in the room tonight due to timing, however tolerated very well.) Assistive device: Rolling walker (2 wheeled) Gait Pattern/deviations: Step-to pattern     General Gait Details: cues for step to pattern tonight due to heaviness in LLE  Stairs            Wheelchair Mobility    Modified Rankin (Stroke Patients Only)       Balance Overall balance assessment: Needs assistance Sitting-balance support: No upper extremity supported;Feet supported Sitting balance-Leahy Scale: Good     Standing balance support: Bilateral upper  extremity supported;During functional activity Standing balance-Leahy Scale: Fair                               Pertinent Vitals/Pain Pain Assessment: 0-10 Pain Score: 2  Pain Location: L hip Pain Descriptors / Indicators: Sore;Heaviness Pain Intervention(s): Monitored during session;Ice applied    Home Living Family/patient expects to be discharged to:: Private residence Living Arrangements: Alone Available Help at Discharge: Family Type of Home: House Home Access: Stairs to enter Entrance Stairs-Rails: None Entrance Stairs-Number of Steps: 1 Home Layout: One level Home Equipment: Walker - 2 wheels      Prior Function Level of Independence: Independent with assistive device(s)         Comments: occassionally used a crutch due to the pain in both hips. Used to be more active but with the pain has become less active     Hand Dominance        Extremity/Trunk Assessment        Lower Extremity Assessment Lower Extremity Assessment: Overall WFL for tasks assessed       Communication   Communication: No difficulties  Cognition Arousal/Alertness: Awake/alert Behavior During Therapy: WFL for tasks assessed/performed Overall Cognitive Status: Within Functional Limits for tasks assessed                                        General Comments  Exercises Total Joint Exercises Ankle Circles/Pumps: AROM;Both;10 reps;Supine Quad Sets: AROM;Left;10 reps;Supine Heel Slides: AAROM;Supine;Left;10 reps Hip ABduction/ADduction: AAROM;Supine;Left;10 reps   Assessment/Plan    PT Assessment Patient needs continued PT services  PT Problem List Decreased strength;Decreased mobility;Decreased activity tolerance       PT Treatment Interventions DME instruction;Gait training;Stair training;Functional mobility training;Therapeutic activities;Therapeutic exercise;Patient/family education    PT Goals (Current goals can be found in the Care  Plan section)  Acute Rehab PT Goals Patient Stated Goal: I want to have less pain so I can do more PT Goal Formulation: With patient Time For Goal Achievement: 07/08/21 Potential to Achieve Goals: Good    Frequency 7X/week   Barriers to discharge        Co-evaluation               AM-PAC PT "6 Clicks" Mobility  Outcome Measure Help needed turning from your back to your side while in a flat bed without using bedrails?: A Little Help needed moving from lying on your back to sitting on the side of a flat bed without using bedrails?: A Little Help needed moving to and from a bed to a chair (including a wheelchair)?: A Little Help needed standing up from a chair using your arms (e.g., wheelchair or bedside chair)?: A Little Help needed to walk in hospital room?: A Little Help needed climbing 3-5 steps with a railing? : A Little 6 Click Score: 18    End of Session   Activity Tolerance: Patient tolerated treatment well Patient left: in bed;with call bell/phone within reach;with family/visitor present Nurse Communication: Mobility status PT Visit Diagnosis: Other abnormalities of gait and mobility (R26.89)    Time: 3903-0092 PT Time Calculation (min) (ACUTE ONLY): 37 min   Charges:   PT Evaluation $PT Eval Low Complexity: 1 Low PT Treatments $Gait Training: 8-22 mins        Mindel Friscia, PT, MPT Acute Rehabilitation Services Office: [phone] Pager: [phone] 07/01/2021   Keona Sheffler, Clois Dupes 07/01/2021, 7:22 PM

## 2021-07-02 DIAGNOSIS — M1612 Unilateral primary osteoarthritis, left hip: Secondary | ICD-10-CM | POA: Diagnosis not present

## 2021-07-02 LAB — BASIC METABOLIC PANEL
Anion gap: 8 (ref 5–15)
BUN: 22 mg/dL — ABNORMAL HIGH (ref 6–20)
CO2: 26 mmol/L (ref 22–32)
Calcium: 8.5 mg/dL — ABNORMAL LOW (ref 8.9–10.3)
Chloride: 102 mmol/L (ref 98–111)
Creatinine, Ser: 1.1 mg/dL (ref 0.61–1.24)
GFR, Estimated: >60 mL/min (ref >60)
Glucose, Bld: 129 mg/dL — ABNORMAL HIGH (ref 70–99)
Potassium: 3.9 mmol/L (ref 3.5–5.1)
Sodium: 136 mmol/L (ref 135–145)

## 2021-07-02 LAB — CBC
HCT: 37.5 % — ABNORMAL LOW (ref 39.0–52.0)
Hemoglobin: 12.1 g/dL — ABNORMAL LOW (ref 13.0–17.0)
MCH: 27.5 pg (ref 26.0–34.0)
MCHC: 32.3 g/dL (ref 30.0–36.0)
MCV: 85.2 fL (ref 80.0–100.0)
Platelets: 170 10*3/uL (ref 150–400)
RBC: 4.4 MIL/uL (ref 4.22–5.81)
RDW: 15.3 % (ref 11.5–15.5)
WBC: 13.9 10*3/uL — ABNORMAL HIGH (ref 4.0–10.5)
nRBC: 0 % (ref 0.0–0.2)

## 2021-07-02 MED ORDER — ASPIRIN 81 MG PO CHEW: 81.0000 mg | CHEWABLE_TABLET | Freq: Two times a day (BID) | ORAL | 0 refills | Status: DC

## 2021-07-02 MED ORDER — METHOCARBAMOL 500 MG PO TABS: 500.0000 mg | ORAL_TABLET | Freq: Four times a day (QID) | ORAL | 1 refills | Status: DC | PRN

## 2021-07-02 MED ORDER — OXYCODONE HCL 5 MG PO TABS: 5.0000 mg | ORAL_TABLET | ORAL | 0 refills | Status: DC | PRN

## 2021-07-02 NOTE — TOC Transition Note (Signed)
Transition of Care [facility]) - CM/SW Discharge Note   Patient Details  Name: Alan Coffey MRN: [id number] Date of Birth: 04/15/1966  [facility] of [facility]) CM/SW Contact:  Amada Jupiter, LCSW Phone Number: 07/02/2021, 10:44 AM   Clinical Narrative:    Spoke briefly with pt and confirming need for 3n1 commode - order sent to [facility] for delivery to pt's room.  Plan for HHPT via [facility].  No further TOC needs.   Final next level of care: Home w Home Health Services Barriers to Discharge: No Barriers Identified   Patient Goals and CMS Choice Patient states their goals for this hospitalization and ongoing recovery are:: return home      Discharge Placement                       Discharge Plan and Services                DME Arranged: 3-N-1 DME Agency: [facility] Date DME Agency Contacted: 07/02/21 Time DME Agency Contacted: 1030 Representative spoke with at DME Agency: Leavy Cella HH Arranged: PT HH Agency: [facility] Date [facility] Agency Contacted: 07/01/21 Time HH Agency Contacted: 1500 Representative spoke with at [facility] Agency: Ayesha Mohair  Social Determinants of Health (SDOH) Interventions     Readmission Risk Interventions No flowsheet data found.

## 2021-07-02 NOTE — Discharge Summary (Signed)
Patient ID: Alan Coffey MRN: [id number] DOB/AGE: [birth year] 54 y.o.  Admit date: 07/01/2021 Discharge date: 07/02/2021  Admission Diagnoses:  Principal Problem:   Unilateral primary osteoarthritis, left hip Active Problems:   Status post left hip replacement   Discharge Diagnoses:  Same  Past Medical History:  Diagnosis Date   Arthritis    Dry skin    on chest using goldbond lotion on   Hypertension    Sciatic nerve pain right comes and goes    Surgeries: Procedure(s): LEFT TOTAL HIP ARTHROPLASTY ANTERIOR APPROACH on 07/01/2021   Consultants:   Discharged Condition: Improved  Hospital Course: Alan Coffey is an 55 y.o. male who was admitted 07/01/2021 for operative treatment ofUnilateral primary osteoarthritis, left hip. Patient has severe unremitting pain that affects sleep, daily activities, and work/hobbies. After pre-op clearance the patient was taken to the operating room on 07/01/2021 and underwent  Procedure(s): LEFT TOTAL HIP ARTHROPLASTY ANTERIOR APPROACH.    Patient was given perioperative antibiotics:  Anti-infectives (From admission, onward)    Start     Dose/Rate Route Frequency Ordered Stop   07/01/21 1800  ceFAZolin (ANCEF) IVPB 1 g/50 mL premix        1 g 100 mL/hr over 30 Minutes Intravenous Every 6 hours 07/01/21 1528 07/02/21 0015   07/01/21 0930  ceFAZolin (ANCEF) IVPB 2g/100 mL premix        2 g 200 mL/hr over 30 Minutes Intravenous On call to O.R. 07/01/21 8588 07/01/21 1214        Patient was given sequential compression devices, early ambulation, and chemoprophylaxis to prevent DVT.  Patient benefited maximally from hospital stay and there were no complications.    Recent vital signs: Patient Vitals for the past 24 hrs:  BP Temp Temp src Pulse Resp SpO2  07/02/21 0907 133/86 98.4 F (36.9 C) Oral 93 16 98 %  07/02/21 0544 115/78 98.3 F (36.8 C) Oral 83 18 97 %  07/02/21 0147 121/78 98.4 F (36.9 C) Oral 84 16 96 %  07/01/21  2142 127/84 98.4 F (36.9 C) Oral 85 18 96 %  07/01/21 1732 138/82 97.9 F (36.6 C) Oral 72 14 98 %  07/01/21 1524 (!) 136/96 97.7 F (36.5 C) Oral 73 16 98 %  07/01/21 1500 (!) 124/95 -- -- 71 10 97 %  07/01/21 1445 (!) 124/91 97.6 F (36.4 C) -- 70 13 98 %  07/01/21 1430 (!) 141/92 -- -- 62 12 99 %  07/01/21 1415 (!) 140/103 -- -- 71 14 100 %  07/01/21 1400 117/85 97.6 F (36.4 C) -- 70 (!) 27 100 %  07/01/21 1352 (!) 88/59 97.6 F (36.4 C) -- 72 -- 100 %     Recent laboratory studies:  Recent Labs    07/02/21 0218  WBC 13.9*  HGB 12.1*  HCT 37.5*  PLT 170  NA 136  K 3.9  CL 102  CO2 26  BUN 22*  CREATININE 1.10  GLUCOSE 129*  CALCIUM 8.5*     Discharge Medications:   Allergies as of 07/02/2021   No Known Allergies      Medication List     TAKE these medications    amLODipine 5 MG tablet Commonly known as: NORVASC Take 5 mg by mouth daily.   aspirin 81 MG chewable tablet Chew 1 tablet (81 mg total) by mouth 2 (two) times daily.   diclofenac 75 MG EC tablet Commonly known as: VOLTAREN Take 1 tablet (75 mg total) by  mouth 2 (two) times daily.   ibuprofen 200 MG tablet Commonly known as: ADVIL Take 400-600 mg by mouth every 6 (six) hours as needed for mild pain.   lisinopril-hydrochlorothiazide 20-12.5 MG tablet Commonly known as: ZESTORETIC Take 1 tablet by mouth daily.   methocarbamol 500 MG tablet Commonly known as: ROBAXIN Take 1 tablet (500 mg total) by mouth every 6 (six) hours as needed for muscle spasms.   oxyCODONE 5 MG immediate release tablet Commonly known as: Oxy IR/ROXICODONE Take 1-2 tablets (5-10 mg total) by mouth every 4 (four) hours as needed for moderate pain (pain score 4-6).               Durable Medical Equipment  (From admission, onward)           Start     Ordered   07/01/21 1529  DME 3 n 1  Once        07/01/21 1528   07/01/21 1529  DME Walker rolling  Once       Question Answer Comment  Walker: With  5 Inch Wheels   Patient needs a walker to treat with the following condition Status post left hip replacement      07/01/21 1528            Diagnostic Studies: DG Pelvis Portable  Result Date: 07/01/2021 CLINICAL DATA:  Postop left hip replacement EXAM: PORTABLE PELVIS 1-2 VIEWS COMPARISON:  Same day hip radiograph and radiograph Apr 28, 2021 FINDINGS: Postsurgical changes of left total hip arthroplasty, with anatomic alignment of the hardware and no evidence of acute complication. Severe right hip degenerative change. Lumbar spondylosis. IMPRESSION: Postsurgical changes of left total hip arthroplasty without evidence of acute complication. Electronically Signed   By: Maudry Mayhew MD   On: 07/01/2021 16:23   DG C-Arm 1-60 Min-No Report  Result Date: 07/01/2021 CLINICAL DATA:  Left hip arthroplasty EXAM: OPERATIVE left HIP (WITH PELVIS IF PERFORMED) 2 VIEWS TECHNIQUE: Fluoroscopic spot image(s) were submitted for interpretation post-operatively. COMPARISON:  04/28/2021 FINDINGS: Fluoroscopic spot images demonstrate a left total hip prosthesis in place without appreciable fracture or acute complicating feature. There is also markedly severe degenerative right hip arthropathy. IMPRESSION: 1. Left total hip prosthesis in place without appreciable fracture or acute complicating feature. Electronically Signed   By: Gaylyn Rong M.D.   On: 07/01/2021 14:14   DG HIP OPERATIVE UNILAT W OR W/O PELVIS LEFT  Result Date: 07/01/2021 CLINICAL DATA:  Left hip arthroplasty EXAM: OPERATIVE left HIP (WITH PELVIS IF PERFORMED) 2 VIEWS TECHNIQUE: Fluoroscopic spot image(s) were submitted for interpretation post-operatively. COMPARISON:  04/28/2021 FINDINGS: Fluoroscopic spot images demonstrate a left total hip prosthesis in place without appreciable fracture or acute complicating feature. There is also markedly severe degenerative right hip arthropathy. IMPRESSION: 1. Left total hip prosthesis in place without  appreciable fracture or acute complicating feature. Electronically Signed   By: Gaylyn Rong M.D.   On: 07/01/2021 14:14    Disposition: Discharge disposition: 01-Home or Self Care          Follow-up Information     Kathryne Hitch, MD Follow up in 2 week(s).   Specialty: Orthopedic Surgery Contact information: [address] [place] [address] [phone]                  Signed: Kathryne Hitch 07/02/2021, 10:38 AM

## 2021-07-02 NOTE — Discharge Instructions (Signed)

## 2021-07-02 NOTE — Plan of Care (Signed)
  Problem: Skin Integrity: Goal: Demonstration of wound healing without infection will improve 07/02/2021 0922 by Sherren Kerns, RN Outcome: Progressing 07/02/2021 0742 by Sherren Kerns, RN Outcome: Progressing   Problem: Education: Goal: Knowledge of the prescribed therapeutic regimen will improve 07/02/2021 0922 by Sherren Kerns, RN Outcome: Progressing 07/02/2021 0742 by Sherren Kerns, RN Outcome: Progressing   Problem: Activity: Goal: Ability to avoid complications of mobility impairment will improve 07/02/2021 0922 by Sherren Kerns, RN Outcome: Progressing 07/02/2021 0742 by Sherren Kerns, RN Outcome: Progressing   Problem: Skin Integrity: Goal: Will show signs of wound healing 07/02/2021 0922 by Sherren Kerns, RN Outcome: Progressing 07/02/2021 0742 by Sherren Kerns, RN Outcome: Progressing

## 2021-07-02 NOTE — Plan of Care (Signed)
  Problem: Skin Integrity: Goal: Demonstration of wound healing without infection will improve Outcome: Progressing   Problem: Education: Goal: Understanding of discharge needs will improve Outcome: Progressing   Problem: Clinical Measurements: Goal: Postoperative complications will be avoided or minimized Outcome: Progressing

## 2021-07-02 NOTE — Progress Notes (Signed)
Physical Therapy Treatment Patient Details Name: Alan Coffey MRN: [id number] DOB: Oct 18, 1966 Today's Date: 07/02/2021    History of Present Illness pt s/p L DA THA 07/01/2021.    PT Comments    Pt tolerated session this morning but did have more soreness and a little difficulty progressing LLE during gait and swing cycle. Pt prefers to try ambulation one more time before DC hopefully this afternoon.    Follow Up Recommendations  Follow surgeon's recommendation for DC plan and follow-up therapies (according to chart pt having HHPT)     Equipment Recommendations  None recommended by PT    Recommendations for Other Services       Precautions / Restrictions Precautions Precautions: None Restrictions Weight Bearing Restrictions: No    Mobility  Bed Mobility Overal bed mobility: Needs Assistance Bed Mobility: Supine to Sit;Sit to Supine     Supine to sit: Min assist     General bed mobility comments: cues for sequencing and assist with LE    Transfers Overall transfer level: Needs assistance Equipment used: Rolling walker (2 wheeled) Transfers: Sit to/from Stand Sit to Stand: Min guard         General transfer comment: cues for RW safety  Ambulation/Gait Ambulation/Gait assistance: Min guard Gait Distance (Feet): 50 Feet Assistive device: Rolling walker (2 wheeled) Gait Pattern/deviations: Step-to pattern Gait velocity: slow  and labored   General Gait Details: cues for step to pattern , and slow progressiona dn slight diffiulty progressing LLE due to hip flexor soreness   Stairs             Wheelchair Mobility    Modified Rankin (Stroke Patients Only)       Balance Overall balance assessment: Needs assistance Sitting-balance support: No upper extremity supported;Feet supported Sitting balance-Leahy Scale: Good     Standing balance support: Bilateral upper extremity supported;During functional activity Standing balance-Leahy Scale:  Fair                              Cognition Arousal/Alertness: Awake/alert Behavior During Therapy: WFL for tasks assessed/performed Overall Cognitive Status: Within Functional Limits for tasks assessed                                        Exercises Total Joint Exercises Ankle Circles/Pumps: AROM;Both;10 reps;Supine Quad Sets: AROM;Left;10 reps;Supine Heel Slides: AAROM;Supine;Left;10 reps Hip ABduction/ADduction: AAROM;Supine;Left;10 reps Knee Flexion: AROM;Left;Seated;10 reps    General Comments        Pertinent Vitals/Pain Pain Assessment: 0-10 Pain Score: 5  Pain Location: L hip Pain Descriptors / Indicators: Sore;Aching Pain Intervention(s): Monitored during session;Ice applied    Home Living                      Prior Function            PT Goals (current goals can now be found in the care plan section) Acute Rehab PT Goals Patient Stated Goal: I want to have less pain so I can do more PT Goal Formulation: With patient Time For Goal Achievement: 07/08/21 Potential to Achieve Goals: Good Progress towards PT goals: Progressing toward goals    Frequency    7X/week      PT Plan Current plan remains appropriate    Co-evaluation  AM-PAC PT "6 Clicks" Mobility   Outcome Measure  Help needed turning from your back to your side while in a flat bed without using bedrails?: A Little Help needed moving from lying on your back to sitting on the side of a flat bed without using bedrails?: A Little Help needed moving to and from a bed to a chair (including a wheelchair)?: A Little Help needed standing up from a chair using your arms (e.g., wheelchair or bedside chair)?: A Little Help needed to walk in hospital room?: A Little Help needed climbing 3-5 steps with a railing? : A Little 6 Click Score: 18    End of Session Equipment Utilized During Treatment: Gait belt Activity Tolerance: Patient tolerated  treatment well Patient left: with call bell/phone within reach;in chair Nurse Communication: Mobility status PT Visit Diagnosis: Other abnormalities of gait and mobility (R26.89)     Time: 1000-1030 PT Time Calculation (min) (ACUTE ONLY): 30 min  Charges:  $Gait Training: 8-22 mins $Therapeutic Exercise: 8-22 mins                     D'Arcy Abraha, PT, MPT Acute Rehabilitation Services Office: [phone] Pager: [phone] 07/02/2021    Alyxander Kollmann, Clois Dupes 07/02/2021, 11:06 AM

## 2021-07-02 NOTE — Progress Notes (Signed)
Subjective: 1 Day Post-Op Procedure(s) (LRB): LEFT TOTAL HIP ARTHROPLASTY ANTERIOR APPROACH (Left) Patient reports pain as moderate.    Objective: Vital signs in last 24 hours: Temp:  [97.6 F (36.4 C)-98.4 F (36.9 C)] 98.4 F (36.9 C) (07/09 0907) Pulse Rate:  [62-93] 93 (07/09 0907) Resp:  [10-27] 16 (07/09 0907) BP: (88-141)/(59-103) 133/86 (07/09 0907) SpO2:  [96 %-100 %] 98 % (07/09 0907)  Intake/Output from previous day: 07/08 0701 - 07/09 0700 In: 3700.7 [P.O.:720; I.V.:2680.7; IV Piggyback:300] Out: 2000 [Urine:1800; Blood:200] Intake/Output this shift: Total I/O In: 796.4 [P.O.:720; I.V.:76.4] Out: 200 [Urine:200]  Recent Labs    07/02/21 0218  HGB 12.1*   Recent Labs    07/02/21 0218  WBC 13.9*  RBC 4.40  HCT 37.5*  PLT 170   Recent Labs    07/02/21 0218  NA 136  K 3.9  CL 102  CO2 26  BUN 22*  CREATININE 1.10  GLUCOSE 129*  CALCIUM 8.5*   No results for input(s): LABPT, INR in the last 72 hours.  Sensation intact distally Intact pulses distally Dorsiflexion/Plantar flexion intact Incision: scant drainage   Assessment/Plan: 1 Day Post-Op Procedure(s) (LRB): LEFT TOTAL HIP ARTHROPLASTY ANTERIOR APPROACH (Left) Up with therapy Discharge home with home health      Kathryne Hitch 07/02/2021, 10:36 AM

## 2021-07-02 NOTE — Plan of Care (Signed)
Pt to d/c home with family. No needs at this time.  

## 2021-07-02 NOTE — Progress Notes (Signed)
Physical Therapy Treatment Patient Details Name: Alan Coffey MRN: [id number] DOB: 12/14/1966 Today's Date: 07/02/2021    History of Present Illness pt s/p L DA THA 07/01/2021.    PT Comments    Pt continued to have soreness and pain in L hip area with little burning as well. Noted some swelling in this area and educated with movement and ice for home management. Pt still with little difficulty progressing LLE with gait. However tolerated movement and feel safe to DC home from mobility standpoint at this time with family assisting as needed.     Follow Up Recommendations  Follow surgeon's recommendation for DC plan and follow-up therapies     Equipment Recommendations       Recommendations for Other Services       Precautions / Restrictions Precautions Precautions: None    Mobility  Bed Mobility Overal bed mobility: Needs Assistance Bed Mobility: Supine to Sit;Sit to Supine     Supine to sit: Min assist          Transfers Overall transfer level: Needs assistance Equipment used: Rolling walker (2 wheeled) Transfers: Sit to/from Stand Sit to Stand: Min guard         General transfer comment: cues for RW safety  Ambulation/Gait Ambulation/Gait assistance: Min guard Gait Distance (Feet): 40 Feet Assistive device: Rolling walker (2 wheeled) Gait Pattern/deviations: Step-to pattern Gait velocity: slow   General Gait Details: Continued to have  slow progression and slight diffiulty progressing LLE due to hip flexor soreness and this afternoon more burning sensation   Stairs             Wheelchair Mobility    Modified Rankin (Stroke Patients Only)       Balance Overall balance assessment: Needs assistance Sitting-balance support: No upper extremity supported;Feet supported Sitting balance-Leahy Scale: Good     Standing balance support: Bilateral upper extremity supported;During functional activity Standing balance-Leahy Scale: Fair                               Cognition Arousal/Alertness: Awake/alert Behavior During Therapy: WFL for tasks assessed/performed Overall Cognitive Status: Within Functional Limits for tasks assessed                                        Exercises      General Comments        Pertinent Vitals/Pain Pain Assessment: 0-10 Pain Score: 6  Pain Location: L hip Pain Descriptors / Indicators: Sore;Aching;Burning Pain Intervention(s): Monitored during session;Ice applied    Home Living                      Prior Function            PT Goals (current goals can now be found in the care plan section) Acute Rehab PT Goals Patient Stated Goal: I want to have less pain so I can do more PT Goal Formulation: With patient Time For Goal Achievement: 07/08/21 Potential to Achieve Goals: Good    Frequency    7X/week      PT Plan Current plan remains appropriate    Co-evaluation              AM-PAC PT "6 Clicks" Mobility   Outcome Measure  Help needed turning from your back to your side while in  a flat bed without using bedrails?: None Help needed moving from lying on your back to sitting on the side of a flat bed without using bedrails?: None Help needed moving to and from a bed to a chair (including a wheelchair)?: A Little Help needed standing up from a chair using your arms (e.g., wheelchair or bedside chair)?: A Little Help needed to walk in hospital room?: A Little Help needed climbing 3-5 steps with a railing? : A Little 6 Click Score: 20    End of Session Equipment Utilized During Treatment: Gait belt Activity Tolerance: Patient tolerated treatment well Patient left: with call bell/phone within reach;in chair Nurse Communication: Mobility status       Time: 1420-1445 PT Time Calculation (min) (ACUTE ONLY): 25 min  Charges:  $Gait Training: 8-22 mins $Therapeutic Exercise: 8-22 mins                     Chava Dulac, PT,  MPT Acute Rehabilitation Services Office: [phone] Pager: [phone] 07/02/2021    Mariella Blackwelder, Clois Dupes 07/02/2021, 7:00 PM

## 2021-07-02 NOTE — Progress Notes (Signed)
Pt stable at time of d/c. No needs at this time. Pt dressing clean, dry, and intact. Rn will continue to monitor.

## 2021-07-07 ENCOUNTER — Encounter (HOSPITAL_COMMUNITY): Payer: Self-pay | Admitting: Orthopaedic Surgery

## 2021-07-14 ENCOUNTER — Ambulatory Visit (INDEPENDENT_AMBULATORY_CARE_PROVIDER_SITE_OTHER): Payer: 59 | Admitting: Orthopaedic Surgery

## 2021-07-14 ENCOUNTER — Encounter: Payer: Self-pay | Admitting: Orthopaedic Surgery

## 2021-07-14 VITALS — Ht 69.0 in | Wt 255.0 lb

## 2021-07-14 DIAGNOSIS — Z96642 Presence of left artificial hip joint: Secondary | ICD-10-CM

## 2021-07-14 NOTE — Progress Notes (Signed)
The patient wait 2 weeks tomorrow status post a left total hip arthroplasty.  He is ambulate with a cane and not taking pain medications.  He has been on a baby aspirin twice a day and is doing well.  He has known osteoarthritis of the right hip.  That is been hurting more than his left operative hip.  I did remove the staples from his left hip incision and placed Steri-Strips.  There is no seroma.  His leg lengths appear equal.  His calf is soft.  He will go down to just 1 aspirin a day for a week and then can stop aspirin.  He can start Advil or Aleve as needed.  All questions and concerns were answered and addressed.  He is already driving which is fine since he is not on narcotics.  We will see him back in 4 weeks for repeat exam but no x-rays are needed.  We can then start talking about when to schedule his right total hip and has been 90 days out from his left total hip.

## 2021-08-11 ENCOUNTER — Encounter: Payer: 59 | Admitting: Orthopaedic Surgery

## 2021-08-15 ENCOUNTER — Ambulatory Visit (INDEPENDENT_AMBULATORY_CARE_PROVIDER_SITE_OTHER): Payer: 59 | Admitting: Orthopaedic Surgery

## 2021-08-15 ENCOUNTER — Encounter: Payer: Self-pay | Admitting: Orthopaedic Surgery

## 2021-08-15 DIAGNOSIS — Z96642 Presence of left artificial hip joint: Secondary | ICD-10-CM

## 2021-08-15 DIAGNOSIS — M1611 Unilateral primary osteoarthritis, right hip: Secondary | ICD-10-CM

## 2021-08-15 NOTE — Progress Notes (Signed)
The patient is now 6 weeks status post a left total hip arthroplasty.  That hip is doing very well for him.  He has well-documented known severe end-stage arthritis of his right hip.  We have always plan to replace his right hip all along once he was getting over his left hip replacement.  He feels ready at this standpoint.  His left hip pain is gone and his hip is doing wonderful.  His right hip pain is definitely affecting his mobility, his quality of life and his actives daily living.  Previous x-rays of the right hip shows severe end-stage arthritis with complete loss of the hip joint.  Examination of the left more recent operative hip shows that move smoothly and fluidly and is pain-free.  His right hip has significant decrease in internal and external rotation and severe pain in the groin with rotation.  He has tried and failed conservative treatment for now well over a year with the right hip as well.  At this point we will work on proceeding with a right total hip arthroplasty in October of this year.  All questions and concerns were answered and addressed.  We will work on getting her scheduled.

## 2021-09-21 ENCOUNTER — Other Ambulatory Visit: Payer: Self-pay

## 2021-10-03 NOTE — Progress Notes (Signed)
Sent message, via epic in basket, requesting orders in epic from surgeon.  

## 2021-10-04 ENCOUNTER — Other Ambulatory Visit: Payer: Self-pay | Admitting: Physician Assistant

## 2021-10-04 DIAGNOSIS — M1611 Unilateral primary osteoarthritis, right hip: Secondary | ICD-10-CM

## 2021-10-06 NOTE — Progress Notes (Addendum)
COVID swab appointment:10/12/21  COVID Vaccine Completed: yes x2 Date COVID Vaccine completed: Has received booster: COVID vaccine manufacturer:    Moderna     Date of COVID positive in last 90 days: no  PCP - [name], MD Cardiologist - n/a  Chest x-ray - n/a EKG - 06/20/21 Epic Stress Test - n/a ECHO - n/a Cardiac Cath - n/a Pacemaker/ICD device last checked: n/a Spinal Cord Stimulator: n/a  Sleep Study - years ago was positive pt lost weight CPAP - no  Fasting Blood Sugar - n/a Checks Blood Sugar _____ times a day  Blood Thinner Instructions: n/a Aspirin Instructions: Last Dose:  Activity level: Can go up a flight of stairs and perform activities of daily living without stopping and without symptoms of chest pain or shortness of breath.     Anesthesia review:   Patient denies shortness of breath, fever, cough and chest pain at PAT appointment   Patient verbalized understanding of instructions that were given to them at the PAT appointment. Patient was also instructed that they will need to review over the PAT instructions again at home before surgery.

## 2021-10-06 NOTE — Patient Instructions (Addendum)
DUE TO COVID-19 ONLY ONE VISITOR IS ALLOWED TO COME WITH YOU AND STAY IN THE WAITING ROOM ONLY DURING PRE OP AND PROCEDURE.   **NO VISITORS ARE ALLOWED IN THE SHORT STAY AREA OR RECOVERY ROOM!!**  IF YOU WILL BE ADMITTED INTO THE HOSPITAL YOU ARE ALLOWED ONLY TWO SUPPORT PEOPLE DURING VISITATION HOURS ONLY (10AM -8PM)   The support person(s) may change daily. The support person(s) must pass our screening, gel in and out, and wear a mask at all times, including in the patient's room. Patients must also wear a mask when staff or their support person are in the room.  No visitors under the age of 1. Any visitor under the age of 46 must be accompanied by an adult.    COVID SWAB TESTING MUST BE COMPLETED ON:  10/12/21 **MUST PRESENT COMPLETED FORM AT TESTING SITE**    [address] [place] (backside of the building) Open 8am-3pm. No appointment needed. You are not required to quarantine, however you are required to wear a well-fitted mask when you are out and around people not in your household.  Hand Hygiene often Do NOT share personal items Notify your provider if you are in close contact with someone who has COVID or you develop fever 100.4 or greater, new onset of sneezing, cough, sore throat, shortness of breath or body aches.       Your procedure is scheduled on: 10/14/21   Report to [facility] Main Entrance    Report to admitting at 7:00 AM   Call this number if you have problems the morning of surgery [phone]   Do not eat food :After Midnight.   May have liquids until 6:45 AM day of surgery  CLEAR LIQUID DIET  Foods Allowed                                                                     Foods Excluded  Water, Black Coffee and tea (no milk or creamer)           liquids that you cannot  Plain Jell-O in any flavor  (No red)                                   see through such as: Fruit ices (not with fruit pulp)                                            milk, soups, orange juice              Iced Popsicles (No red)                                               All solid food                                   Apple juices  Sports drinks like Gatorade (No red) Lightly seasoned clear broth or consume(fat free) Sugar     The day of surgery:  Drink ONE (1) Pre-Surgery Clear Ensure by 6:45 am the morning of surgery. Drink in one sitting. Do not sip.  This drink was given to you during your hospital  pre-op appointment visit. Nothing else to drink after completing the  Pre-Surgery Clear Ensure          If you have questions, please contact your surgeon's office.     Oral Hygiene is also important to reduce your risk of infection.                                    Remember - BRUSH YOUR TEETH THE MORNING OF SURGERY WITH YOUR REGULAR TOOTHPASTE   Do NOT smoke after Midnight   Take these medicines the morning of surgery with A SIP OF WATER: Amlodipine                              You may not have any metal on your body including jewelry, and body piercing             Do not wear lotions, powders, cologne, or deodorant              Men may shave face and neck.   Do not bring valuables to the hospital. [place] IS NOT             RESPONSIBLE   FOR VALUABLES.   Bring small overnight bag day of surgery.  Special Instructions: Bring a copy of your healthcare power of attorney and living will documents         the day of surgery if you haven't scanned them before.   Please read over the following fact sheets you were given: IF YOU HAVE QUESTIONS ABOUT YOUR PRE-OP INSTRUCTIONS PLEASE CALL [phone]- [facility] - Preparing for Surgery Before surgery, you can play an important role.  Because skin is not sterile, your skin needs to be as free of germs as possible.  You can reduce the number of germs on your skin by washing with CHG (chlorahexidine gluconate) soap before surgery.  CHG is an antiseptic cleaner which kills  germs and bonds with the skin to continue killing germs even after washing. Please DO NOT use if you have an allergy to CHG or antibacterial soaps.  If your skin becomes reddened/irritated stop using the CHG and inform your nurse when you arrive at Short Stay. Do not shave (including legs and underarms) for at least 48 hours prior to the first CHG shower.  You may shave your face/neck.  Please follow these instructions carefully:  1.  Shower with CHG Soap the night before surgery and the  morning of surgery.  2.  If you choose to wash your hair, wash your hair first as usual with your normal  shampoo.  3.  After you shampoo, rinse your hair and body thoroughly to remove the shampoo.                             4.  Use CHG as you would any other liquid soap.  You can apply chg directly to the skin and wash.  Gently with a scrungie or clean  washcloth.  5.  Apply the CHG Soap to your body ONLY FROM THE NECK DOWN.   Do   not use on face/ open                           Wound or open sores. Avoid contact with eyes, ears mouth and   genitals (private parts).                       Wash face,  Genitals (private parts) with your normal soap.             6.  Wash thoroughly, paying special attention to the area where your    surgery  will be performed.  7.  Thoroughly rinse your body with warm water from the neck down.  8.  DO NOT shower/wash with your normal soap after using and rinsing off the CHG Soap.                9.  Pat yourself dry with a clean towel.            10.  Wear clean pajamas.            11.  Place clean sheets on your bed the night of your first shower and do not  sleep with pets. Day of Surgery : Do not apply any lotions/deodorants the morning of surgery.  Please wear clean clothes to the hospital/surgery center.  FAILURE TO FOLLOW THESE INSTRUCTIONS MAY RESULT IN THE CANCELLATION OF YOUR SURGERY  PATIENT SIGNATURE_________________________________  NURSE  SIGNATURE__________________________________  ________________________________________________________________________   Alan Coffey  An incentive spirometer is a tool that can help keep your lungs clear and active. This tool measures how well you are filling your lungs with each breath. Taking long deep breaths may help reverse or decrease the chance of developing breathing (pulmonary) problems (especially infection) following: A long period of time when you are unable to move or be active. BEFORE THE PROCEDURE  If the spirometer includes an indicator to show your best effort, your nurse or respiratory therapist will set it to a desired goal. If possible, sit up straight or lean slightly forward. Try not to slouch. Hold the incentive spirometer in an upright position. INSTRUCTIONS FOR USE  Sit on the edge of your bed if possible, or sit up as far as you can in bed or on a chair. Hold the incentive spirometer in an upright position. Breathe out normally. Place the mouthpiece in your mouth and seal your lips tightly around it. Breathe in slowly and as deeply as possible, raising the piston or the ball toward the top of the column. Hold your breath for 3-5 seconds or for as long as possible. Allow the piston or ball to fall to the bottom of the column. Remove the mouthpiece from your mouth and breathe out normally. Rest for a few seconds and repeat Steps 1 through 7 at least 10 times every 1-2 hours when you are awake. Take your time and take a few normal breaths between deep breaths. The spirometer may include an indicator to show your best effort. Use the indicator as a goal to work toward during each repetition. After each set of 10 deep breaths, practice coughing to be sure your lungs are clear. If you have an incision (the cut made at the time of surgery), support your incision when coughing by placing a pillow or rolled up towels  firmly against it. Once you are able to get out of  bed, walk around indoors and cough well. You may stop using the incentive spirometer when instructed by your caregiver.  RISKS AND COMPLICATIONS Take your time so you do not get dizzy or light-headed. If you are in pain, you may need to take or ask for pain medication before doing incentive spirometry. It is harder to take a deep breath if you are having pain. AFTER USE Rest and breathe slowly and easily. It can be helpful to keep track of a log of your progress. Your caregiver can provide you with a simple table to help with this. If you are using the spirometer at home, follow these instructions: SEEK MEDICAL CARE IF:  You are having difficultly using the spirometer. You have trouble using the spirometer as often as instructed. Your pain medication is not giving enough relief while using the spirometer. You develop fever of 100.5 F (38.1 C) or higher. SEEK IMMEDIATE MEDICAL CARE IF:  You cough up bloody sputum that had not been present before. You develop fever of 102 F (38.9 C) or greater. You develop worsening pain at or near the incision site. MAKE SURE YOU:  Understand these instructions. Will watch your condition. Will get help right away if you are not doing well or get worse. Document Released: 04/23/2007 Document Revised: 03/04/2012 Document Reviewed: 06/24/2007 [employer] Patient Information 2014 [employer], [place].   ________________________________________________________________________  WHAT IS A BLOOD TRANSFUSION? Blood Transfusion Information  A transfusion is the replacement of blood or some of its parts. Blood is made up of multiple cells which provide different functions. Red blood cells carry oxygen and are used for blood loss replacement. White blood cells fight against infection. Platelets control bleeding. Plasma helps clot blood. Other blood products are available for specialized needs, such as hemophilia or other clotting disorders. BEFORE THE TRANSFUSION   Who gives blood for transfusions?  Healthy volunteers who are fully evaluated to make sure their blood is safe. This is blood bank blood. Transfusion therapy is the safest it has ever been in the practice of medicine. Before blood is taken from a donor, a complete history is taken to make sure that person has no history of diseases nor engages in risky social behavior (examples are intravenous drug use or sexual activity with multiple partners). The donor's travel history is screened to minimize risk of transmitting infections, such as malaria. The donated blood is tested for signs of infectious diseases, such as HIV and hepatitis. The blood is then tested to be sure it is compatible with you in order to minimize the chance of a transfusion reaction. If you or a relative donates blood, this is often done in anticipation of surgery and is not appropriate for emergency situations. It takes many days to process the donated blood. RISKS AND COMPLICATIONS Although transfusion therapy is very safe and saves many lives, the main dangers of transfusion include:  Getting an infectious disease. Developing a transfusion reaction. This is an allergic reaction to something in the blood you were given. Every precaution is taken to prevent this. The decision to have a blood transfusion has been considered carefully by your caregiver before blood is given. Blood is not given unless the benefits outweigh the risks. AFTER THE TRANSFUSION Right after receiving a blood transfusion, you will usually feel much better and more energetic. This is especially true if your red blood cells have gotten low (anemic). The transfusion raises the level of the  red blood cells which carry oxygen, and this usually causes an energy increase. The nurse administering the transfusion will monitor you carefully for complications. HOME CARE INSTRUCTIONS  No special instructions are needed after a transfusion. You may find your energy is  better. Speak with your caregiver about any limitations on activity for underlying diseases you may have. SEEK MEDICAL CARE IF:  Your condition is not improving after your transfusion. You develop redness or irritation at the intravenous (IV) site. SEEK IMMEDIATE MEDICAL CARE IF:  Any of the following symptoms occur over the next 12 hours: Shaking chills. You have a temperature by mouth above 102 F (38.9 C), not controlled by medicine. Chest, back, or muscle pain. People around you feel you are not acting correctly or are confused. Shortness of breath or difficulty breathing. Dizziness and fainting. You get a rash or develop hives. You have a decrease in urine output. Your urine turns a dark color or changes to pink, red, or brown. Any of the following symptoms occur over the next 10 days: You have a temperature by mouth above 102 F (38.9 C), not controlled by medicine. Shortness of breath. Weakness after normal activity. The white part of the eye turns yellow (jaundice). You have a decrease in the amount of urine or are urinating less often. Your urine turns a dark color or changes to pink, red, or brown. Document Released: 12/08/2000 Document Revised: 03/04/2012 Document Reviewed: 07/27/2008 [facility] Patient Information 2014 [place], [place].  _______________________________________________________________________

## 2021-10-07 ENCOUNTER — Encounter (HOSPITAL_COMMUNITY)
Admission: RE | Admit: 2021-10-07 | Discharge: 2021-10-07 | Disposition: A | Discharge status: Home / Self Care | Payer: 59 | Source: Ambulatory Visit | Attending: Orthopaedic Surgery | Admitting: Orthopaedic Surgery

## 2021-10-07 ENCOUNTER — Encounter (HOSPITAL_COMMUNITY): Payer: Self-pay

## 2021-10-07 DIAGNOSIS — Z01812 Encounter for preprocedural laboratory examination: Secondary | ICD-10-CM | POA: Diagnosis present

## 2021-10-07 DIAGNOSIS — M1611 Unilateral primary osteoarthritis, right hip: Secondary | ICD-10-CM

## 2021-10-07 LAB — CBC
HCT: 46.5 % (ref 39.0–52.0)
Hemoglobin: 14.9 g/dL (ref 13.0–17.0)
MCH: 27 pg (ref 26.0–34.0)
MCHC: 32 g/dL (ref 30.0–36.0)
MCV: 84.2 fL (ref 80.0–100.0)
Platelets: 199 10*3/uL (ref 150–400)
RBC: 5.52 MIL/uL (ref 4.22–5.81)
RDW: 14.5 % (ref 11.5–15.5)
WBC: 9.9 10*3/uL (ref 4.0–10.5)
nRBC: 0 % (ref 0.0–0.2)

## 2021-10-07 LAB — BASIC METABOLIC PANEL
Anion gap: 5 (ref 5–15)
BUN: 21 mg/dL — ABNORMAL HIGH (ref 6–20)
CO2: 30 mmol/L (ref 22–32)
Calcium: 9 mg/dL (ref 8.9–10.3)
Chloride: 99 mmol/L (ref 98–111)
Creatinine, Ser: 1.16 mg/dL (ref 0.61–1.24)
GFR, Estimated: >60 mL/min (ref >60)
Glucose, Bld: 84 mg/dL (ref 70–99)
Potassium: 4 mmol/L (ref 3.5–5.1)
Sodium: 134 mmol/L — ABNORMAL LOW (ref 135–145)

## 2021-10-07 LAB — SURGICAL PCR SCREEN
MRSA, PCR: NEGATIVE
Staphylococcus aureus: POSITIVE — AB

## 2021-10-07 NOTE — Progress Notes (Signed)
PCR came back [facility] +. Results routed to Dr. Magnus Ivan.

## 2021-10-12 ENCOUNTER — Other Ambulatory Visit: Payer: Self-pay | Admitting: Orthopaedic Surgery

## 2021-10-12 LAB — SARS CORONAVIRUS 2 (TAT 6-24 HRS): SARS Coronavirus 2: NEGATIVE

## 2021-10-13 NOTE — H&P (Signed)
TOTAL HIP ADMISSION H&P  Patient is admitted for right total hip arthroplasty.  Subjective:  Chief Complaint: right hip pain  HPI: Alan Coffey, 55 y.o. male, has a history of pain and functional disability in the right hip(s) due to arthritis and patient has failed non-surgical conservative treatments for greater than 12 weeks to include NSAID's and/or analgesics, corticosteriod injections, flexibility and strengthening excercises, use of assistive devices, weight reduction as appropriate, and activity modification.  Onset of symptoms was gradual starting 3 years ago with gradually worsening course since that time.The patient noted no past surgery on the right hip(s).  Patient currently rates pain in the right hip at 10 out of 10 with activity. Patient has night pain, worsening of pain with activity and weight bearing, trendelenberg gait, pain that interfers with activities of daily living, and pain with passive range of motion. Patient has evidence of subchondral cysts, subchondral sclerosis, periarticular osteophytes, and joint space narrowing by imaging studies. This condition presents safety issues increasing the risk of falls.  There is no current active infection.  Patient Active Problem List   Diagnosis Date Noted   Status post left hip replacement 07/01/2021   Unilateral primary osteoarthritis, left hip 05/19/2021   Unilateral primary osteoarthritis, right hip 05/19/2021   Mass of soft tissue of left upper extremity 03/30/2020   Past Medical History:  Diagnosis Date   Arthritis    Dry skin    on chest using goldbond lotion on   Hypertension    Sciatic nerve pain right comes and goes    Past Surgical History:  Procedure Laterality Date   cyst removed left shoulder      MASS EXCISION Left 06/11/2020   Procedure: REMOVAL OF LEFT SHOULDER SUBCUTANEOUS MASS;  Surgeon: Michael Boston, MD;  Location: [facility];  Service: General;  Laterality: Left;  MAC VS  GENERAL (ANESTHESIA CHOICE)   TOTAL HIP ARTHROPLASTY Left 07/01/2021   Procedure: LEFT TOTAL HIP ARTHROPLASTY ANTERIOR APPROACH;  Surgeon: Mcarthur Rossetti, MD;  Location: [facility];  Service: Orthopedics;  Laterality: Left;    No current facility-administered medications for this encounter.   Current Outpatient Medications  Medication Sig Dispense Refill Last Dose   amLODipine (NORVASC) 5 MG tablet Take 5 mg by mouth daily.      diclofenac (VOLTAREN) 75 MG EC tablet Take 1 tablet (75 mg total) by mouth 2 (two) times daily. (Patient taking differently: Take 75 mg by mouth daily.) 60 tablet 3    ketoconazole (NIZORAL) 2 % shampoo Apply 1 application topically daily.      ketoconazole (NIZORAL) 200 MG tablet Take 200 mg by mouth 2 (two) times a week. Take Tuesday and Friday      lisinopril-hydrochlorothiazide (ZESTORETIC) 20-12.5 MG tablet Take 1 tablet by mouth daily.      Oxymetazoline HCl (NASAL SPRAY NA) Place 1 spray into the nose daily as needed (Congestion).      aspirin 81 MG chewable tablet Chew 1 tablet (81 mg total) by mouth 2 (two) times daily. (Patient not taking: Reported on 10/04/2021) 30 tablet 0 Completed Course   methocarbamol (ROBAXIN) 500 MG tablet Take 1 tablet (500 mg total) by mouth every 6 (six) hours as needed for muscle spasms. (Patient not taking: Reported on 10/04/2021) 40 tablet 1 Not Taking   No Known Allergies  Social History   Tobacco Use   Smoking status: Every Day    Packs/day: 0.50    Years: 37.00    Pack years:  18.50    Types: Cigarettes   Smokeless tobacco: Never  Substance Use Topics   Alcohol use: Never    No family history on file.   Review of Systems  Musculoskeletal:  Positive for gait problem.  All other systems reviewed and are negative.  Objective:  Physical Exam Vitals reviewed.  Constitutional:      Appearance: Normal appearance.  HENT:     Head: Normocephalic and atraumatic.  Eyes:     Extraocular Movements: Extraocular  movements intact.     Pupils: Pupils are equal, round, and reactive to light.  Cardiovascular:     Rate and Rhythm: Normal rate and regular rhythm.  Pulmonary:     Effort: Pulmonary effort is normal.     Breath sounds: Normal breath sounds.  Abdominal:     Palpations: Abdomen is soft.  Musculoskeletal:     Cervical back: Normal range of motion and neck supple.     Right hip: Tenderness and bony tenderness present. Decreased range of motion. Decreased strength.  Neurological:     Mental Status: He is alert and oriented to person, place, and time.  Psychiatric:        Behavior: Behavior normal.    Vital signs in last 24 hours:    Labs:   Estimated body mass index is 38.31 kg/m as calculated from the following:   Height as of 10/07/21: _0  (1.753 m).   Weight as of 10/07/21: 117.7 kg.   Imaging Review Plain radiographs demonstrate severe degenerative joint disease of the right hip(s). The bone quality appears to be excellent for age and reported activity level.      Assessment/Plan:  End stage arthritis, right hip(s)  The patient history, physical examination, clinical judgement of the provider and imaging studies are consistent with end stage degenerative joint disease of the right hip(s) and total hip arthroplasty is deemed medically necessary. The treatment options including medical management, injection therapy, arthroscopy and arthroplasty were discussed at length. The risks and benefits of total hip arthroplasty were presented and reviewed. The risks due to aseptic loosening, infection, stiffness, dislocation/subluxation,  thromboembolic complications and other imponderables were discussed.  The patient acknowledged the explanation, agreed to proceed with the plan and consent was signed. Patient is being admitted for inpatient treatment for surgery, pain control, PT, OT, prophylactic antibiotics, VTE prophylaxis, progressive ambulation and ADL's and discharge  planning.The patient is planning to be discharged home with home health services

## 2021-10-13 NOTE — Anesthesia Preprocedure Evaluation (Addendum)
Anesthesia Evaluation  Patient identified by MRN, date of birth, ID band Patient awake    Reviewed: Allergy & Precautions, NPO status , Patient's Chart, lab work & pertinent test results  History of Anesthesia Complications Negative for: history of anesthetic complications  Airway Mallampati: II  TM Distance: >3 FB Neck ROM: Full    Dental  (+) Missing,    Pulmonary Current Smoker and Patient abstained from smoking.,    Pulmonary exam normal        Cardiovascular hypertension, Pt. on medications Normal cardiovascular exam     Neuro/Psych negative neurological ROS  negative psych ROS   GI/Hepatic negative GI ROS, Neg liver ROS,   Endo/Other  BMI 38  Renal/GU negative Renal ROS  negative genitourinary   Musculoskeletal  (+) Arthritis ,   Abdominal   Peds  Hematology negative hematology ROS (+)   Anesthesia Other Findings Day of surgery medications reviewed with patient.  Reproductive/Obstetrics negative OB ROS                            Anesthesia Physical Anesthesia Plan  ASA: 2  Anesthesia Plan: Spinal   Post-op Pain Management:    Induction:   PONV Risk Score and Plan: 2 and Treatment may vary due to age or medical condition, Ondansetron, Propofol infusion, Dexamethasone and Midazolam  Airway Management Planned: Natural Airway and Simple Face Mask  Additional Equipment: None  Intra-op Plan:   Post-operative Plan:   Informed Consent: I have reviewed the patients History and Physical, chart, labs and discussed the procedure including the risks, benefits and alternatives for the proposed anesthesia with the patient or authorized representative who has indicated his/her understanding and acceptance.       Plan Discussed with: CRNA  Anesthesia Plan Comments:        Anesthesia Quick Evaluation

## 2021-10-14 ENCOUNTER — Ambulatory Visit (HOSPITAL_COMMUNITY): Payer: 59

## 2021-10-14 ENCOUNTER — Encounter (HOSPITAL_COMMUNITY): Payer: Self-pay | Admitting: Orthopaedic Surgery

## 2021-10-14 ENCOUNTER — Encounter (HOSPITAL_COMMUNITY)
Admission: RE | Disposition: A | Discharge status: Home / Self Care | Payer: Self-pay | Source: Ambulatory Visit | Attending: Orthopaedic Surgery

## 2021-10-14 ENCOUNTER — Observation Stay (HOSPITAL_COMMUNITY)
Admission: RE | Admit: 2021-10-14 | Discharge: 2021-10-15 | Disposition: A | Discharge status: Home / Self Care | Payer: 59 | Source: Ambulatory Visit | Attending: Orthopaedic Surgery | Admitting: Orthopaedic Surgery

## 2021-10-14 ENCOUNTER — Other Ambulatory Visit: Payer: Self-pay

## 2021-10-14 ENCOUNTER — Observation Stay (HOSPITAL_COMMUNITY): Payer: 59

## 2021-10-14 ENCOUNTER — Ambulatory Visit (HOSPITAL_COMMUNITY): Payer: 59 | Admitting: Certified Registered"

## 2021-10-14 DIAGNOSIS — Z7982 Long term (current) use of aspirin: Secondary | ICD-10-CM | POA: Diagnosis not present

## 2021-10-14 DIAGNOSIS — Z96642 Presence of left artificial hip joint: Secondary | ICD-10-CM | POA: Insufficient documentation

## 2021-10-14 DIAGNOSIS — Z96641 Presence of right artificial hip joint: Secondary | ICD-10-CM

## 2021-10-14 DIAGNOSIS — F17210 Nicotine dependence, cigarettes, uncomplicated: Secondary | ICD-10-CM | POA: Diagnosis not present

## 2021-10-14 DIAGNOSIS — M1611 Unilateral primary osteoarthritis, right hip: Principal | ICD-10-CM | POA: Insufficient documentation

## 2021-10-14 DIAGNOSIS — I10 Essential (primary) hypertension: Secondary | ICD-10-CM | POA: Diagnosis not present

## 2021-10-14 DIAGNOSIS — Z419 Encounter for procedure for purposes other than remedying health state, unspecified: Secondary | ICD-10-CM

## 2021-10-14 HISTORY — PX: TOTAL HIP ARTHROPLASTY: SHX124

## 2021-10-14 LAB — TYPE AND SCREEN
ABO/RH(D): B POS
Antibody Screen: NEGATIVE

## 2021-10-14 IMAGING — RF DG HIP (WITH PELVIS) OPERATIVE*R*
1 series · 3 of 3 positions shown · non-contrast
Comparison: Preoperative radiograph [DATE]

CLINICAL DATA: Right total hip replacement.

EXAM:
OPERATIVE RIGHT HIP (WITH PELVIS IF PERFORMED)
TECHNIQUE: Fluoroscopic spot image(s) were submitted for interpretation
post-operatively.

[Series 1: unknown protocol · 0.20mm/px · 3 of 3 slices shown]
[im 1/3]
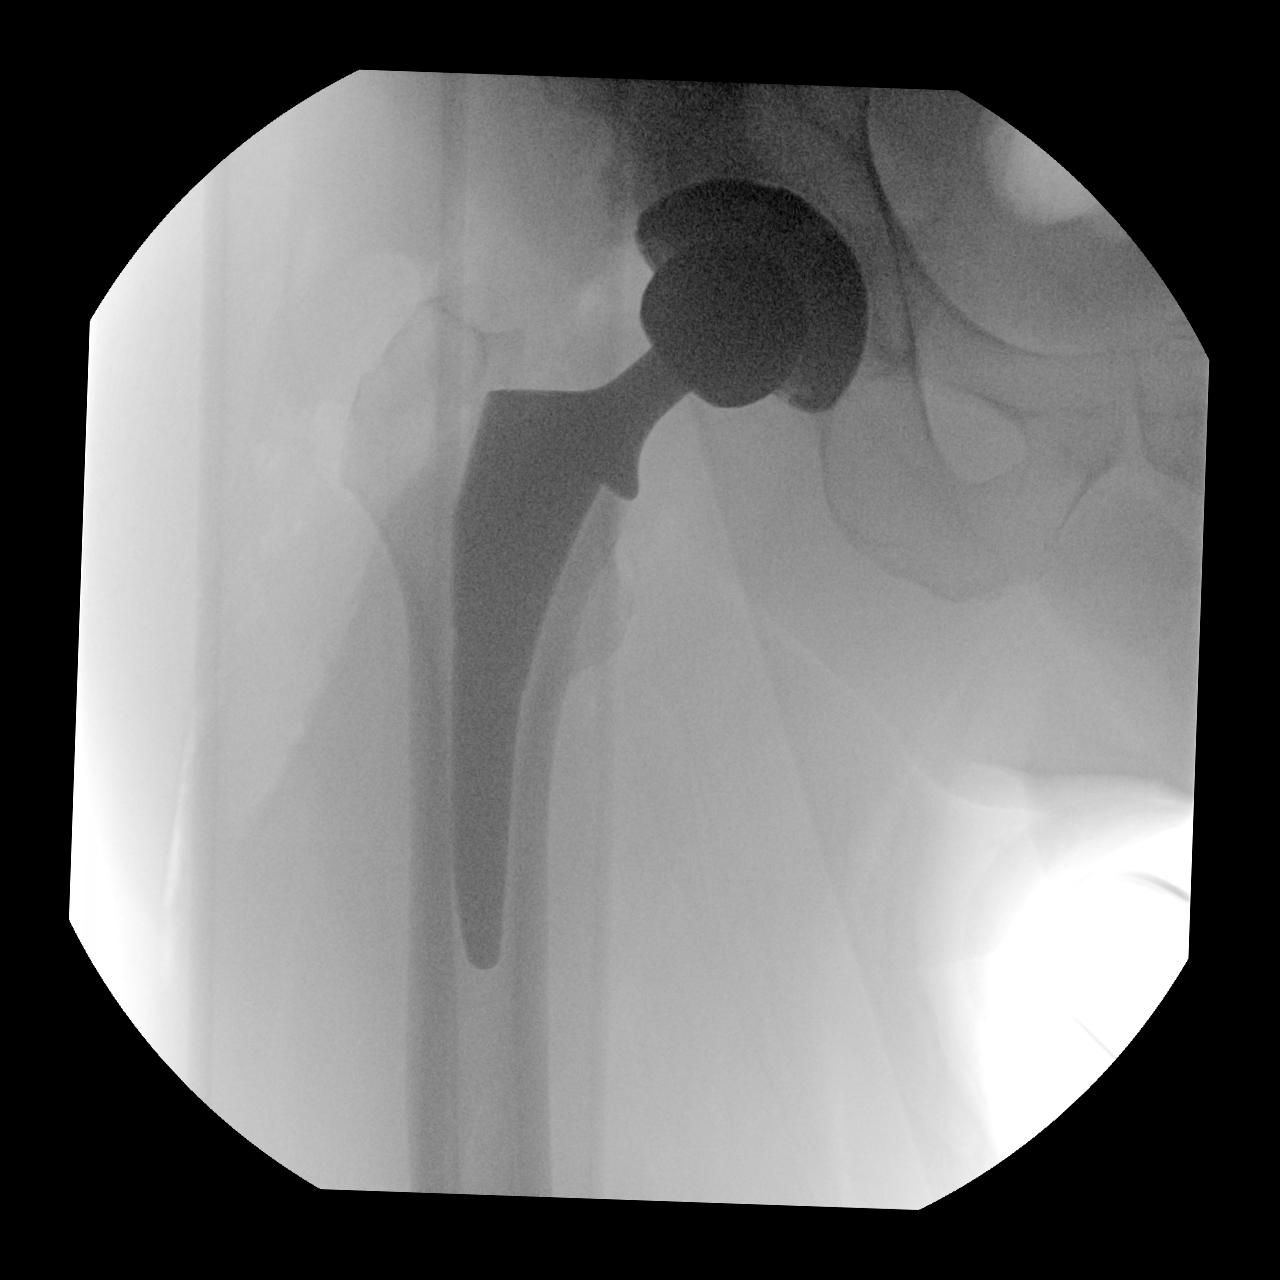
[im 2/3]
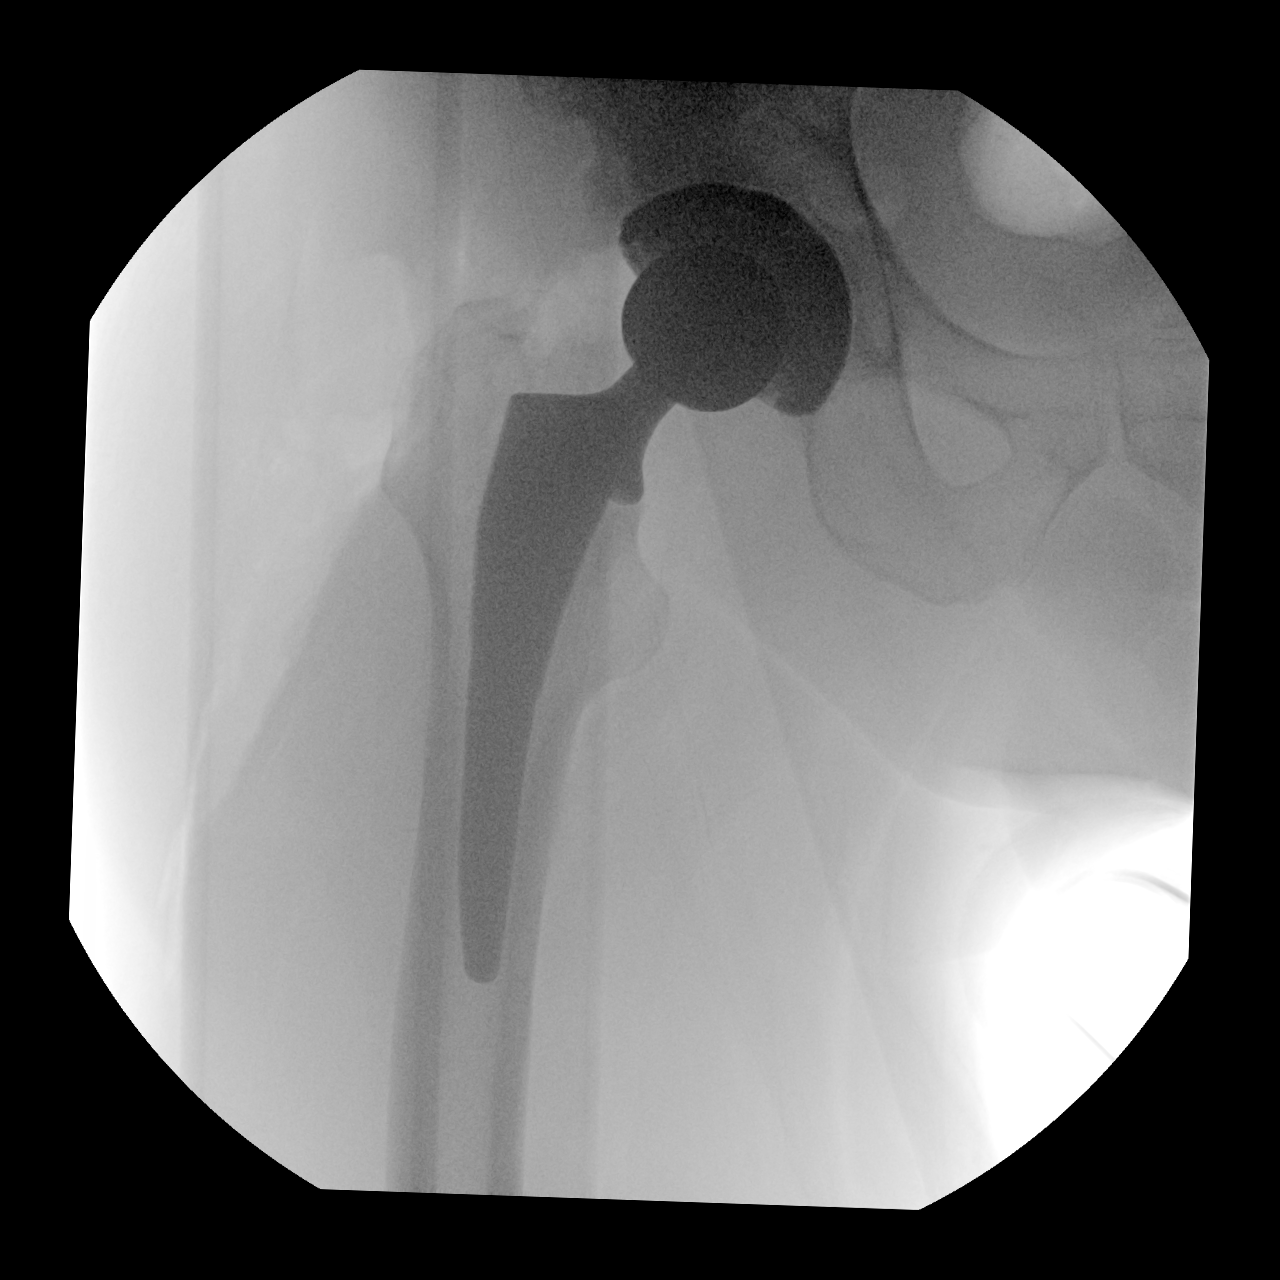
[im 3/3]
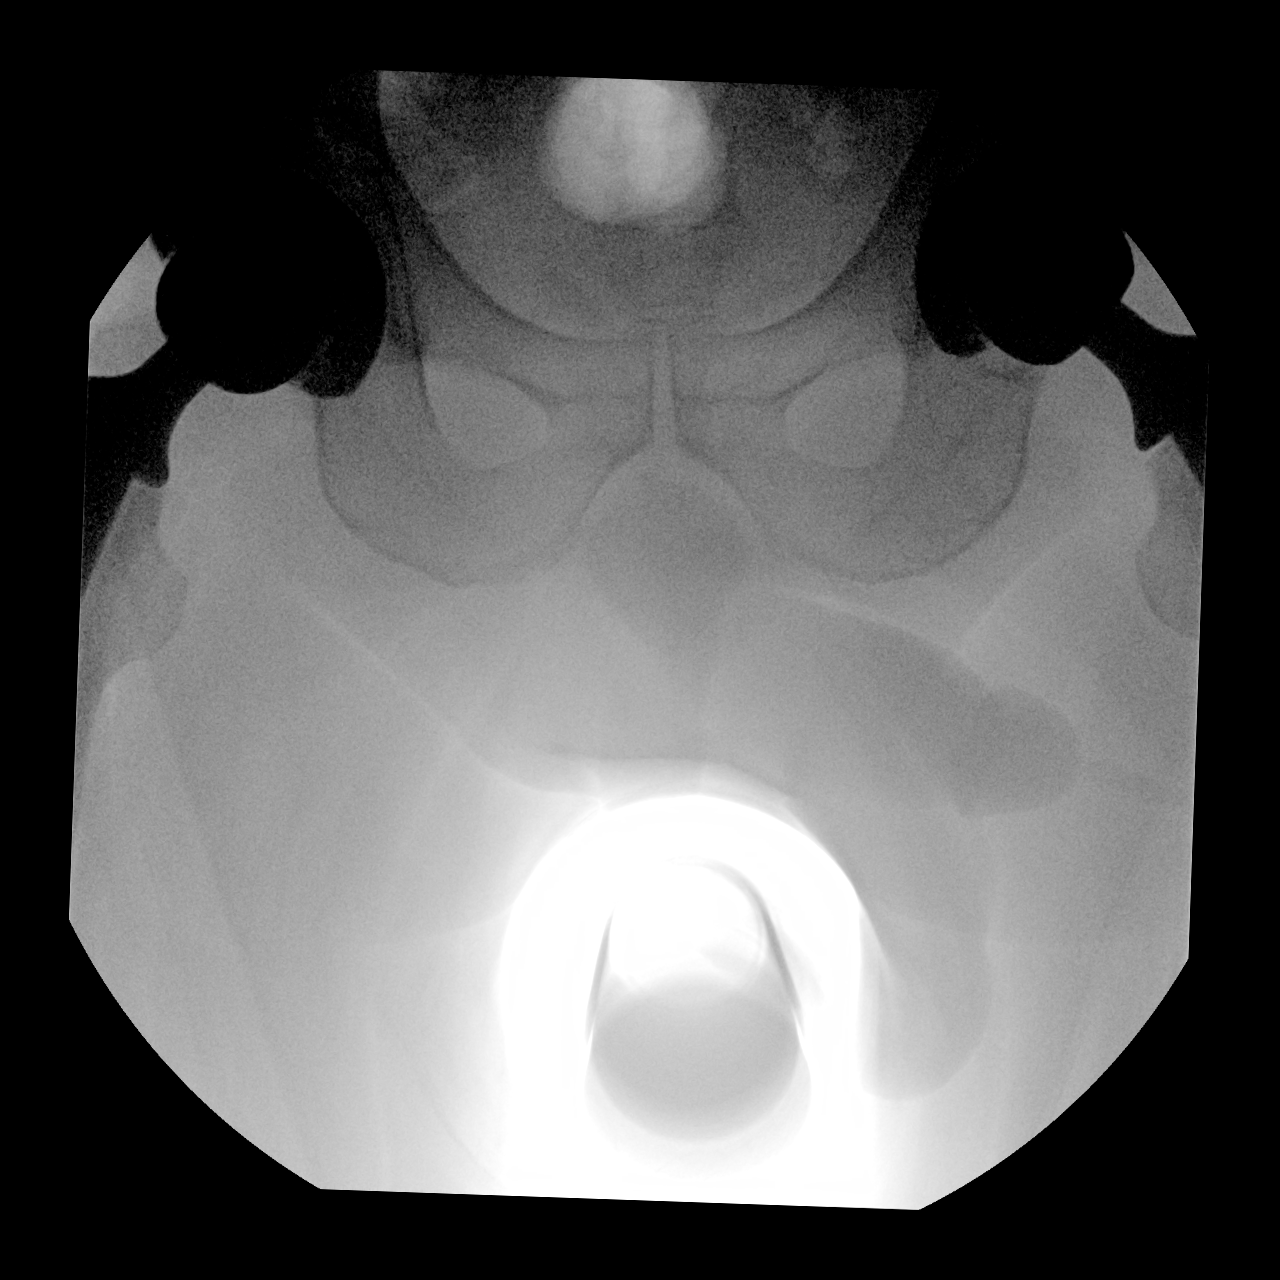

[3 of 3 positions shown; findings below may reference images not displayed]

FINDINGS: Three fluoroscopic spot views of the pelvis and right hip obtained
in the operating room. Right hip arthroplasty in expected alignment.
Left hip arthroplasty is included on the pelvis view. Fluoroscopy
time 19 seconds.
IMPRESSION: Procedural fluoroscopy for right hip arthroplasty.

## 2021-10-14 SURGERY — ARTHROPLASTY, HIP, TOTAL, ANTERIOR APPROACH
Anesthesia: Spinal | Site: Hip | Laterality: Right

## 2021-10-14 MED ORDER — ONDANSETRON HCL 4 MG/2ML IJ SOLN
INTRAMUSCULAR | Status: DC | PRN
  Administered 2021-10-14: 4 mg via INTRAVENOUS

## 2021-10-14 MED ORDER — 0.9 % SODIUM CHLORIDE (POUR BTL) OPTIME
TOPICAL | Status: DC | PRN
  Administered 2021-10-14: 1000 mL

## 2021-10-14 MED ORDER — TRANEXAMIC ACID-NACL 1000-0.7 MG/100ML-% IV SOLN
1000.0000 mg | INTRAVENOUS | Status: AC
  Administered 2021-10-14: 1000 mg via INTRAVENOUS
  Filled 2021-10-14: qty 100

## 2021-10-14 MED ORDER — SODIUM CHLORIDE 0.9 % IR SOLN
Status: DC | PRN
  Administered 2021-10-14: 1000 mL

## 2021-10-14 MED ORDER — METHOCARBAMOL 500 MG PO TABS
500.0000 mg | ORAL_TABLET | Freq: Four times a day (QID) | ORAL | Status: DC | PRN
  Administered 2021-10-14 – 2021-10-15 (×2): 500 mg via ORAL
  Filled 2021-10-14 (×2): qty 1

## 2021-10-14 MED ORDER — MENTHOL 3 MG MT LOZG: 1.0000 | LOZENGE | OROMUCOSAL | Status: DC | PRN

## 2021-10-14 MED ORDER — METHOCARBAMOL 500 MG IVPB - SIMPLE MED
500.0000 mg | Freq: Four times a day (QID) | INTRAVENOUS | Status: DC | PRN
  Filled 2021-10-14: qty 50

## 2021-10-14 MED ORDER — CEFAZOLIN SODIUM-DEXTROSE 1-4 GM/50ML-% IV SOLN
1.0000 g | Freq: Four times a day (QID) | INTRAVENOUS | Status: AC
Start: 2021-10-14 — End: 2021-10-15
  Administered 2021-10-14 (×2): 1 g via INTRAVENOUS
  Filled 2021-10-14 (×2): qty 50

## 2021-10-14 MED ORDER — HYDROCHLOROTHIAZIDE 12.5 MG PO TABS
12.5000 mg | ORAL_TABLET | Freq: Every day | ORAL | Status: DC
  Administered 2021-10-14 – 2021-10-15 (×2): 12.5 mg via ORAL
  Filled 2021-10-14 (×2): qty 1

## 2021-10-14 MED ORDER — FENTANYL CITRATE PF 50 MCG/ML IJ SOSY: 25.0000 ug | PREFILLED_SYRINGE | INTRAMUSCULAR | Status: DC | PRN

## 2021-10-14 MED ORDER — OXYCODONE HCL 5 MG/5ML PO SOLN: 5.0000 mg | Freq: Once | ORAL | Status: DC | PRN

## 2021-10-14 MED ORDER — FENTANYL CITRATE (PF) 100 MCG/2ML IJ SOLN
INTRAMUSCULAR | Status: DC | PRN
  Administered 2021-10-14: 50 ug via INTRAVENOUS

## 2021-10-14 MED ORDER — ACETAMINOPHEN 325 MG PO TABS
325.0000 mg | ORAL_TABLET | Freq: Four times a day (QID) | ORAL | Status: DC | PRN
  Administered 2021-10-15: 650 mg via ORAL
  Filled 2021-10-14: qty 2

## 2021-10-14 MED ORDER — ONDANSETRON HCL 4 MG PO TABS: 4.0000 mg | ORAL_TABLET | Freq: Four times a day (QID) | ORAL | Status: DC | PRN

## 2021-10-14 MED ORDER — CEFAZOLIN SODIUM-DEXTROSE 2-4 GM/100ML-% IV SOLN
2.0000 g | INTRAVENOUS | Status: AC
  Administered 2021-10-14: 2 g via INTRAVENOUS
  Filled 2021-10-14: qty 100

## 2021-10-14 MED ORDER — ORAL CARE MOUTH RINSE: 15.0000 mL | Freq: Once | OROMUCOSAL | Status: AC

## 2021-10-14 MED ORDER — FENTANYL CITRATE (PF) 100 MCG/2ML IJ SOLN
INTRAMUSCULAR | Status: AC
  Filled 2021-10-14: qty 2

## 2021-10-14 MED ORDER — STERILE WATER FOR IRRIGATION IR SOLN
Status: DC | PRN
  Administered 2021-10-14: 2000 mL

## 2021-10-14 MED ORDER — LACTATED RINGERS IV SOLN: INTRAVENOUS | Status: DC

## 2021-10-14 MED ORDER — OXYCODONE HCL 5 MG PO TABS
5.0000 mg | ORAL_TABLET | ORAL | Status: DC | PRN
  Filled 2021-10-14 (×2): qty 2

## 2021-10-14 MED ORDER — PHENOL 1.4 % MT LIQD: 1.0000 | OROMUCOSAL | Status: DC | PRN

## 2021-10-14 MED ORDER — DEXAMETHASONE SODIUM PHOSPHATE 10 MG/ML IJ SOLN
INTRAMUSCULAR | Status: AC
  Filled 2021-10-14: qty 1

## 2021-10-14 MED ORDER — METOCLOPRAMIDE HCL 5 MG/ML IJ SOLN: 5.0000 mg | Freq: Three times a day (TID) | INTRAMUSCULAR | Status: DC | PRN

## 2021-10-14 MED ORDER — METOCLOPRAMIDE HCL 5 MG PO TABS: 5.0000 mg | ORAL_TABLET | Freq: Three times a day (TID) | ORAL | Status: DC | PRN

## 2021-10-14 MED ORDER — OXYCODONE HCL 5 MG PO TABS: 5.0000 mg | ORAL_TABLET | Freq: Once | ORAL | Status: DC | PRN

## 2021-10-14 MED ORDER — ONDANSETRON HCL 4 MG/2ML IJ SOLN
INTRAMUSCULAR | Status: AC
  Filled 2021-10-14: qty 2

## 2021-10-14 MED ORDER — PROMETHAZINE HCL 25 MG/ML IJ SOLN
6.2500 mg | INTRAMUSCULAR | Status: DC | PRN
Start: 2021-10-14 — End: 2021-10-14

## 2021-10-14 MED ORDER — PHENYLEPHRINE HCL-NACL 20-0.9 MG/250ML-% IV SOLN
INTRAVENOUS | Status: DC | PRN
  Administered 2021-10-14: 20 ug/min via INTRAVENOUS

## 2021-10-14 MED ORDER — ASPIRIN 81 MG PO CHEW
81.0000 mg | CHEWABLE_TABLET | Freq: Two times a day (BID) | ORAL | Status: DC
  Administered 2021-10-14 – 2021-10-15 (×2): 81 mg via ORAL
  Filled 2021-10-14 (×2): qty 1

## 2021-10-14 MED ORDER — BUPIVACAINE IN DEXTROSE 0.75-8.25 % IT SOLN
INTRATHECAL | Status: DC | PRN
  Administered 2021-10-14: 1.8 mL via INTRATHECAL

## 2021-10-14 MED ORDER — MIDAZOLAM HCL 2 MG/2ML IJ SOLN
INTRAMUSCULAR | Status: AC
  Filled 2021-10-14: qty 2

## 2021-10-14 MED ORDER — LISINOPRIL-HYDROCHLOROTHIAZIDE 20-12.5 MG PO TABS: 1.0000 | ORAL_TABLET | Freq: Every day | ORAL | Status: DC

## 2021-10-14 MED ORDER — LIDOCAINE 2% (20 MG/ML) 5 ML SYRINGE
INTRAMUSCULAR | Status: DC | PRN
  Administered 2021-10-14: 40 mg via INTRAVENOUS

## 2021-10-14 MED ORDER — POVIDONE-IODINE 10 % EX SWAB
2.0000 "application " | Freq: Once | CUTANEOUS | Status: AC
  Administered 2021-10-14: 2 via TOPICAL

## 2021-10-14 MED ORDER — CHLORHEXIDINE GLUCONATE 0.12 % MT SOLN
15.0000 mL | Freq: Once | OROMUCOSAL | Status: AC
  Administered 2021-10-14: 15 mL via OROMUCOSAL

## 2021-10-14 MED ORDER — LISINOPRIL 20 MG PO TABS
20.0000 mg | ORAL_TABLET | Freq: Every day | ORAL | Status: DC
  Administered 2021-10-14 – 2021-10-15 (×2): 20 mg via ORAL
  Filled 2021-10-14 (×2): qty 1

## 2021-10-14 MED ORDER — OXYCODONE HCL 5 MG PO TABS
10.0000 mg | ORAL_TABLET | ORAL | Status: DC | PRN
  Administered 2021-10-14: 15 mg via ORAL
  Administered 2021-10-14 – 2021-10-15 (×3): 10 mg via ORAL
  Filled 2021-10-14: qty 3
  Filled 2021-10-14: qty 2

## 2021-10-14 MED ORDER — PROPOFOL 10 MG/ML IV BOLUS
INTRAVENOUS | Status: DC | PRN
  Administered 2021-10-14: 20 mg via INTRAVENOUS

## 2021-10-14 MED ORDER — HYDROMORPHONE HCL 1 MG/ML IJ SOLN
0.5000 mg | INTRAMUSCULAR | Status: DC | PRN
  Filled 2021-10-14: qty 1

## 2021-10-14 MED ORDER — PROPOFOL 10 MG/ML IV BOLUS
INTRAVENOUS | Status: AC
  Filled 2021-10-14: qty 20

## 2021-10-14 MED ORDER — ACETAMINOPHEN 500 MG PO TABS
1000.0000 mg | ORAL_TABLET | Freq: Once | ORAL | Status: AC
  Administered 2021-10-14: 1000 mg via ORAL
  Filled 2021-10-14: qty 2

## 2021-10-14 MED ORDER — MIDAZOLAM HCL 2 MG/2ML IJ SOLN
INTRAMUSCULAR | Status: DC | PRN
  Administered 2021-10-14: 2 mg via INTRAVENOUS

## 2021-10-14 MED ORDER — PROPOFOL 500 MG/50ML IV EMUL
INTRAVENOUS | Status: DC | PRN
  Administered 2021-10-14: 40 ug/kg/min via INTRAVENOUS

## 2021-10-14 MED ORDER — SODIUM CHLORIDE 0.9 % IV SOLN: INTRAVENOUS | Status: DC

## 2021-10-14 MED ORDER — PHENYLEPHRINE 40 MCG/ML (10ML) SYRINGE FOR IV PUSH (FOR BLOOD PRESSURE SUPPORT)
PREFILLED_SYRINGE | INTRAVENOUS | Status: DC | PRN
  Administered 2021-10-14: 40 ug via INTRAVENOUS
  Administered 2021-10-14 (×2): 60 ug via INTRAVENOUS

## 2021-10-14 MED ORDER — ALUM & MAG HYDROXIDE-SIMETH 200-200-20 MG/5ML PO SUSP: 30.0000 mL | ORAL | Status: DC | PRN

## 2021-10-14 MED ORDER — DEXAMETHASONE SODIUM PHOSPHATE 10 MG/ML IJ SOLN
INTRAMUSCULAR | Status: DC | PRN
Start: 2021-10-14 — End: 2021-10-14
  Administered 2021-10-14: 8 mg via INTRAVENOUS

## 2021-10-14 MED ORDER — PANTOPRAZOLE SODIUM 40 MG PO TBEC
40.0000 mg | DELAYED_RELEASE_TABLET | Freq: Every day | ORAL | Status: DC
  Administered 2021-10-14 – 2021-10-15 (×2): 40 mg via ORAL
  Filled 2021-10-14 (×2): qty 1

## 2021-10-14 MED ORDER — AMLODIPINE BESYLATE 5 MG PO TABS
5.0000 mg | ORAL_TABLET | Freq: Every day | ORAL | Status: DC
  Administered 2021-10-14 – 2021-10-15 (×2): 5 mg via ORAL
  Filled 2021-10-14 (×2): qty 1

## 2021-10-14 MED ORDER — DIPHENHYDRAMINE HCL 12.5 MG/5ML PO ELIX
12.5000 mg | ORAL_SOLUTION | ORAL | Status: DC | PRN
Start: 2021-10-14 — End: 2021-10-15

## 2021-10-14 MED ORDER — DOCUSATE SODIUM 100 MG PO CAPS
100.0000 mg | ORAL_CAPSULE | Freq: Two times a day (BID) | ORAL | Status: DC
  Administered 2021-10-14 – 2021-10-15 (×3): 100 mg via ORAL
  Filled 2021-10-14 (×3): qty 1

## 2021-10-14 MED ORDER — ONDANSETRON HCL 4 MG/2ML IJ SOLN: 4.0000 mg | Freq: Four times a day (QID) | INTRAMUSCULAR | Status: DC | PRN

## 2021-10-14 SURGICAL SUPPLY — 39 items
BAG COUNTER SPONGE SURGICOUNT (BAG) ×2 IMPLANT
BAG ZIPLOCK 12X15 (MISCELLANEOUS) IMPLANT
BENZOIN TINCTURE PRP APPL 2/3 (GAUZE/BANDAGES/DRESSINGS) IMPLANT
BLADE SAW SGTL 18X1.27X75 (BLADE) ×2 IMPLANT
COVER PERINEAL POST (MISCELLANEOUS) ×2 IMPLANT
COVER SURGICAL LIGHT HANDLE (MISCELLANEOUS) ×2 IMPLANT
CUP SECTOR GRIPTON 58MM (Orthopedic Implant) ×2 IMPLANT
DRAPE FOOT SWITCH (DRAPES) ×2 IMPLANT
DRAPE STERI IOBAN 125X83 (DRAPES) ×2 IMPLANT
DRAPE U-SHAPE 47X51 STRL (DRAPES) ×4 IMPLANT
DRSG AQUACEL AG ADV 3.5X10 (GAUZE/BANDAGES/DRESSINGS) ×2 IMPLANT
DURAPREP 26ML APPLICATOR (WOUND CARE) ×2 IMPLANT
ELECT REM PT RETURN 15FT ADLT (MISCELLANEOUS) ×2 IMPLANT
GAUZE XEROFORM 1X8 LF (GAUZE/BANDAGES/DRESSINGS) IMPLANT
GLOVE SRG 8 PF TXTR STRL LF DI (GLOVE) ×2 IMPLANT
GLOVE SURG ENC MOIS LTX SZ7.5 (GLOVE) ×2 IMPLANT
GLOVE SURG NEOPR MICRO LF SZ8 (GLOVE) ×2 IMPLANT
GLOVE SURG UNDER POLY LF SZ8 (GLOVE) ×4
GOWN STRL REUS W/TWL XL LVL3 (GOWN DISPOSABLE) ×4 IMPLANT
HANDPIECE INTERPULSE COAX TIP (DISPOSABLE) ×2
HEAD CERAMIC 36 PLUS 8.5 12 14 (Hips) ×2 IMPLANT
HEAD CERAMIC 36 PLUS5 (Hips) ×2 IMPLANT
HOLDER FOLEY CATH W/STRAP (MISCELLANEOUS) ×2 IMPLANT
LINER NEUTRAL 36X58 PLUS4 ×2 IMPLANT
PACK ANTERIOR HIP CUSTOM (KITS) ×2 IMPLANT
PENCIL SMOKE EVACUATOR (MISCELLANEOUS) IMPLANT
SET HNDPC FAN SPRY TIP SCT (DISPOSABLE) ×1 IMPLANT
SPONGE T-LAP 18X18 ~~LOC~~+RFID (SPONGE) ×6 IMPLANT
STAPLER VISISTAT 35W (STAPLE) ×2 IMPLANT
STEM FEMORAL SZ5 HIGH ACTIS (Stem) ×2 IMPLANT
STRIP CLOSURE SKIN 1/2X4 (GAUZE/BANDAGES/DRESSINGS) IMPLANT
SUT ETHIBOND NAB CT1 #1 30IN (SUTURE) ×2 IMPLANT
SUT ETHILON 2 0 PS N (SUTURE) IMPLANT
SUT MNCRL AB 4-0 PS2 18 (SUTURE) IMPLANT
SUT VIC AB 0 CT1 36 (SUTURE) ×2 IMPLANT
SUT VIC AB 1 CT1 36 (SUTURE) ×2 IMPLANT
SUT VIC AB 2-0 CT1 27 (SUTURE) ×4
SUT VIC AB 2-0 CT1 TAPERPNT 27 (SUTURE) ×2 IMPLANT
TRAY FOLEY MTR SLVR 16FR STAT (SET/KITS/TRAYS/PACK) ×2 IMPLANT

## 2021-10-14 NOTE — Brief Op Note (Signed)
10/14/2021  11:44 AM  PATIENT:  Alan Coffey  55 y.o. male  PRE-OPERATIVE DIAGNOSIS:  osteoarthritis right hip  POST-OPERATIVE DIAGNOSIS:  osteoarthritis right hip  PROCEDURE:  Procedure(s): RIGHT TOTAL HIP ARTHROPLASTY ANTERIOR APPROACH (Right)  SURGEON:  Surgeon(s) and Role:    Kathryne Hitch, MD - Primary  PHYSICIAN ASSISTANT:  Rexene Edison, PA-C  ANESTHESIA:   spinal  EBL:  200 mL   COUNTS:  YES  DICTATION: .Other Dictation: Dictation Number [id number]  PLAN OF CARE: Admit for overnight observation  PATIENT DISPOSITION:  PACU - hemodynamically stable.   Delay start of Pharmacological VTE agent (>24hrs) due to surgical blood loss or risk of bleeding: no

## 2021-10-14 NOTE — Plan of Care (Signed)
  Problem: Activity: Goal: Ability to avoid complications of mobility impairment will improve Outcome: Progressing   Problem: Clinical Measurements: Goal: Postoperative complications will be avoided or minimized Outcome: Progressing   Problem: Pain Management: Goal: Pain level will decrease with appropriate interventions Outcome: Progressing   Problem: Skin Integrity: Goal: Will show signs of wound healing Outcome: Progressing   

## 2021-10-14 NOTE — Plan of Care (Signed)
  Problem: Activity: Goal: Ability to tolerate increased activity will improve Outcome: Progressing   Problem: Pain Management: Goal: Pain level will decrease with appropriate interventions Outcome: Progressing   

## 2021-10-14 NOTE — Anesthesia Procedure Notes (Signed)
Procedure Name: MAC Date/Time: 10/14/2021 10:21 AM Performed by: Eben Burow, CRNA Pre-anesthesia Checklist: Patient identified, Emergency Drugs available, Suction available, Patient being monitored and Timeout performed Oxygen Delivery Method: Simple face mask Placement Confirmation: positive ETCO2

## 2021-10-14 NOTE — Transfer of Care (Signed)
Immediate Anesthesia Transfer of Care Note  Patient: Alan Coffey  Procedure(s) Performed: RIGHT TOTAL HIP ARTHROPLASTY ANTERIOR APPROACH (Right: Hip)  Patient Location: PACU  Anesthesia Type:Spinal  Level of Consciousness: drowsy and cooperative  Airway & Oxygen Therapy: Patient Spontanous Breathing and Patient connected to face mask oxygen  Post-op Assessment: Report given to RN and Post -op Vital signs reviewed and stable  Post vital signs: Reviewed and stable  Last Vitals:  Vitals Value Taken Time  BP 113/73 10/14/21 1203  Temp    Pulse 76 10/14/21 1206  Resp 17 10/14/21 1206  SpO2 100 % 10/14/21 1206  Vitals shown include unvalidated device data.  Last Pain:  Vitals:   10/14/21 0733  TempSrc:   PainSc: 0-No pain         Complications: No notable events documented.

## 2021-10-14 NOTE — Interval H&P Note (Signed)
History and Physical Interval Note: The patient understands that he is here today for a right hip replacement to treat his right hip osteoarthritis.  There has been no acute or interval change in his medical status.  Please see recent H&P.  The risks and benefits of surgery been discussed in detail and informed consent is obtained.  The right hip has been marked.  10/14/2021 8:31 AM  Gwendolyn Fill  has presented today for surgery, with the diagnosis of osteoarthritis right hip.  The various methods of treatment have been discussed with the patient and family. After consideration of risks, benefits and other options for treatment, the patient has consented to  Procedure(s): RIGHT TOTAL HIP ARTHROPLASTY ANTERIOR APPROACH (Right) as a surgical intervention.  The patient's history has been reviewed, patient examined, no change in status, stable for surgery.  I have reviewed the patient's chart and labs.  Questions were answered to the patient's satisfaction.     Kathryne Hitch

## 2021-10-14 NOTE — Evaluation (Signed)
Physical Therapy Evaluation Patient Details Name: JOSHIA KITCHINGS MRN: [id number] DOB: [birth year] Today's Date: 10/14/2021  History of Present Illness  Pt s/p R THR and with hx of L THR(07/01/21)  Clinical Impression  Pt s/p R THR and presents with decreased R LE strength/ROM and post op pain limiting functional mobility.  Pt should progress well to dc home with assist of family/friends and follow up HHPT.     Recommendations for follow up therapy are one component of a multi-disciplinary discharge planning process, led by the attending physician.  Recommendations may be updated based on patient status, additional functional criteria and insurance authorization.  Follow Up Recommendations Home health PT;Follow surgeon's recommendation for DC plan and follow-up therapies    Equipment Recommendations  None recommended by PT    Recommendations for Other Services       Precautions / Restrictions Precautions Precautions: Fall Restrictions Weight Bearing Restrictions: No RLE Weight Bearing: Weight bearing as tolerated      Mobility  Bed Mobility Overal bed mobility: Needs Assistance Bed Mobility: Supine to Sit     Supine to sit: Min assist     General bed mobility comments: cues for sequence and use of L LE to self assist.    Transfers Overall transfer level: Needs assistance Equipment used: Rolling walker (2 wheeled) Transfers: Sit to/from Stand Sit to Stand: Min assist;Min guard         General transfer comment: cues for LE management and use of UEs to self assist  Ambulation/Gait Ambulation/Gait assistance: Min assist;Min guard Gait Distance (Feet): 120 Feet Assistive device: Rolling walker (2 wheeled) Gait Pattern/deviations: Step-to pattern;Step-through pattern;Decreased step length - right;Decreased step length - left;Shuffle;Trunk flexed     General Gait Details: cues for posture, position from RW and intial sequence  Stairs             Wheelchair Mobility    Modified Rankin (Stroke Patients Only)       Balance Overall balance assessment: Mild deficits observed, not formally tested                                           Pertinent Vitals/Pain Pain Assessment: 0-10 Pain Score: 4  Pain Location: R hip/thigh Pain Descriptors / Indicators: Aching;Sore Pain Intervention(s): Limited activity within patient's tolerance;Monitored during session;Premedicated before session;Ice applied    Home Living Family/patient expects to be discharged to:: Private residence Living Arrangements: Non-relatives/Friends Available Help at Discharge: Family Type of Home: House Home Access: Stairs to enter Entrance Stairs-Rails: Right;Left;Can reach both Entrance Stairs-Number of Steps: 3 Home Layout: One level Home Equipment: Environmental consultant - 2 wheels;Cane - single point;Bedside commode      Prior Function Level of Independence: Independent               Hand Dominance        Extremity/Trunk Assessment   Upper Extremity Assessment Upper Extremity Assessment: Overall WFL for tasks assessed    Lower Extremity Assessment Lower Extremity Assessment: RLE deficits/detail    Cervical / Trunk Assessment Cervical / Trunk Assessment: Normal  Communication   Communication: No difficulties  Cognition Arousal/Alertness: Awake/alert Behavior During Therapy: WFL for tasks assessed/performed Overall Cognitive Status: Within Functional Limits for tasks assessed  General Comments      Exercises Total Joint Exercises Ankle Circles/Pumps: AROM;Both;15 reps;Supine   Assessment/Plan    PT Assessment Patient needs continued PT services  PT Problem List Decreased strength;Decreased range of motion;Decreased activity tolerance;Decreased balance;Decreased mobility;Decreased knowledge of use of DME;Pain;Obesity       PT Treatment Interventions DME  instruction;Gait training;Stair training;Functional mobility training;Therapeutic activities;Therapeutic exercise;Patient/family education    PT Goals (Current goals can be found in the Care Plan section)  Acute Rehab PT Goals Patient Stated Goal: Regain IND PT Goal Formulation: With patient Time For Goal Achievement: 10/21/21 Potential to Achieve Goals: Good    Frequency 7X/week   Barriers to discharge        Co-evaluation               AM-PAC PT "6 Clicks" Mobility  Outcome Measure Help needed turning from your back to your side while in a flat bed without using bedrails?: A Little Help needed moving from lying on your back to sitting on the side of a flat bed without using bedrails?: A Little Help needed moving to and from a bed to a chair (including a wheelchair)?: A Little Help needed standing up from a chair using your arms (e.g., wheelchair or bedside chair)?: A Little Help needed to walk in hospital room?: A Little Help needed climbing 3-5 steps with a railing? : A Little 6 Click Score: 18    End of Session Equipment Utilized During Treatment: Gait belt Activity Tolerance: Patient tolerated treatment well Patient left: in chair;with call bell/phone within reach;with chair alarm set Nurse Communication: Mobility status PT Visit Diagnosis: Difficulty in walking, not elsewhere classified (R26.2)    Time: 1535-1600 PT Time Calculation (min) (ACUTE ONLY): 25 min   Charges:   PT Evaluation $PT Eval Low Complexity: 1 Low          Mauro Kaufmann PT Acute Rehabilitation Services Pager [phone] Office [phone]   Keyontay Stolz 10/14/2021, 4:04 PM

## 2021-10-14 NOTE — Anesthesia Procedure Notes (Signed)
Spinal  Patient location during procedure: OR Start time: 10/14/2021 10:27 AM Reason for block: surgical anesthesia Staffing Performed: resident/CRNA  Anesthesiologist: Brennan Bailey, MD Resident/CRNA: Eben Burow, CRNA Preanesthetic Checklist Completed: patient identified, IV checked, site marked, risks and benefits discussed, surgical consent, monitors and equipment checked, pre-op evaluation and timeout performed Spinal Block Patient position: sitting Prep: DuraPrep and site prepped and draped Patient monitoring: heart rate, cardiac monitor, continuous pulse ox and blood pressure Approach: midline Location: L3-4 Injection technique: single-shot Needle Needle type: Pencan  Needle gauge: 24 G Needle length: 10 cm Assessment Events: CSF return Additional Notes Pt placed in sitting position, spinal kit expiration date checked and verified, + CSF, - heme, pt tolerated well. Dr Daiva Huge present and supervising throughout SAB placement.

## 2021-10-14 NOTE — Op Note (Signed)
NAME: Alan Coffey, Alan Coffey MEDICAL RECORD NO: [id number] ACCOUNT NO: [id number] DATE OF BIRTH: 1966-05-28 FACILITY: [facility] LOCATION: [id number] PHYSICIAN: Vanita Panda. Magnus Ivan, MD  Operative Report   DATE OF PROCEDURE: 10/14/2021  PREOPERATIVE DIAGNOSIS:  Primary arthritis and degenerative joint disease, right hip.  POSTOPERATIVE DIAGNOSIS:  Primary arthritis and degenerative joint disease, right hip.  PROCEDURE:  Right total hip arthroplasty through direct anterior approach.  IMPLANTS:  DePuy sector Gription acetabulum component, size 58, size 36+4 neutral polyethylene liner, size 5 ACTIS femoral component with high offset, size 36+8.5 ceramic hip ball.  SURGEON:  Doneen Poisson, M.D.  ASSISTANT: Rexene Edison, PA-C.  ANESTHESIA:  Spinal.  ANTIBIOTICS:  2 g IV Ancef.  ESTIMATED BLOOD LOSS:  200 mL.  COMPLICATIONS:  None.  INDICATIONS:  The patient is a 55 year old gentleman with a BMI of 38, who has debilitating arthritis involving both his hips, which was quite severe.  We actually have successfully replaced his left hip just over 3 months ago in July of this year.  His  right hip shows severe end-stage arthritis and given the success of his left hip replacement and given the severe arthritis in his right side and his severe pain with the right hip, he does wish to have this replaced as well.  Having had surgery before,  he is fully aware of the risk of acute blood loss anemia, nerve or vessel injury, fracture, infection, dislocation, DVT, implant failure, skin and soft tissue issues and leg length differences.  He understands our goals are to decrease pain, improve  mobility and overall improve quality of life.  DESCRIPTION OF PROCEDURE:  After informed consent was obtained, appropriate right hip was marked, he was brought to the operating room and sat up on the stretcher where spinal anesthesia was obtained, he was laid in supine position on a stretcher.  Foley  catheter was  placed and both feet had traction boots applied to them.  Next, he was placed supine on the Hana fracture table with the perineal post in place and both legs in line with skeletal traction device and no traction applied.  His right  operative hip was prepped and draped in DuraPrep and sterile drapes.  Timeout was called and he was identified as correct patient, correct right hip.  I then made an incision just inferior and posterior to the anterior superior iliac spine and dissected  down tensor fascia lata muscle.  Tensor fascia was then divided longitudinally to proceed with direct anterior approach to the hip.  We identified and cauterized circumflex vessels and then identified the hip capsule, opened the hip capsule in L-type  format, finding a moderate joint effusion and significant synovitis around the right hip and deformity of the hip in general.  We placed curved retractors around the medial and lateral femoral neck and made a femoral neck cut with an oscillating saw just  proximal to the lesser trochanter and completed this with an osteotome.  We placed a corkscrew guide in the femoral head and removed the femoral head in its entirety and found a wide area devoid of cartilage.  I then placed a bent Hohmann over the  medial acetabular rim and removed remnants of acetabular labrum and other debris.  We then began reaming under direct visualization from a size 48 reamer in a stepwise increments going up to a size 57 reamer with all reamers placed under direct  visualization.  Last reamer was placed under direct fluoroscopy, so we could obtain our depth of  reaming, our inclination and anteversion.  I then placed real DePuy sector Gription acetabular component, size 58 and a 36+4 neutral polyethylene liner.   Attention was then turned to the femur.  With the leg externally rotated to 120 degrees and extended and adducted, we were able to place a Mueller retractor medially and Hohman retractor behind the  greater trochanter.  We released the lateral joint  capsule and used a box cutting osteotome to enter femoral canal and a rongeur to lateralize and we then began broaching using the ACTIS broaching system from a size 0 going up to a size 5. With a size 5 in place, we trialed a standard offset femoral neck  and a 32+15 hip ball, reduced this in acetabulum.  We definitely needed a little bit more offset and leg length and we felt like going to a +5 for the real implant would give [place] good stability.  We removed the trial components and placed the real high  offset femoral component and it was Actis size 5 and the real 36+5 ceramic hip ball and reduced this in the acetabulum and it was not stable on my exam with the extreme of external rotation.  I dislocated the hip and removed that size 36+5 ceramic hip  ball and I trialled a size 36+8.5 trial hip ball and reduced this in acetabulum.  This did give me the stability that I was hoping for.  I then removed the trial ball and placed the real 36+8.5 ceramic hip ball and reduced this in acetabulum and it was  definitely more stable.  We irrigated the soft tissue with normal saline solution using pulsatile lavage.  We assessed the reduction and alignment mechanically and radiographically.  We then closed the joint capsule with interrupted #1 Ethibond suture  followed by 0 Vicryl to close the deep tissue and 2-0 Vicryl to close subcutaneous tissue.  The skin was closed with staples.  Aquacel dressing was applied.  He was taken off the Hana table and taken to recovery room in stable condition with all final  counts being correct.  No complications noted.  Of note, Rexene Edison, PA-C did assist during the entire case.  His assistance was crucial for facilitating all aspects of this case.   MUK D: 10/14/2021 11:43:17 am T: 10/14/2021 11:48:00 pm  JOB: [id number]/ [id number]

## 2021-10-14 NOTE — Anesthesia Postprocedure Evaluation (Signed)
Anesthesia Post Note  Patient: Alan Coffey  Procedure(s) Performed: RIGHT TOTAL HIP ARTHROPLASTY ANTERIOR APPROACH (Right: Hip)     Patient location during evaluation: PACU Anesthesia Type: Spinal Level of consciousness: awake and alert and oriented Pain management: pain level controlled Vital Signs Assessment: post-procedure vital signs reviewed and stable Respiratory status: spontaneous breathing, nonlabored ventilation and respiratory function stable Cardiovascular status: blood pressure returned to baseline Postop Assessment: no apparent nausea or vomiting, spinal receding, no headache and no backache Anesthetic complications: no   No notable events documented.  Last Vitals:  Vitals:   10/14/21 1230 10/14/21 1245  BP: 115/77 106/69  Pulse: 68 72  Resp: 16 16  Temp:    SpO2: 100% 97%    Last Pain:  Vitals:   10/14/21 1245  TempSrc:   PainSc: 0-No pain                 Shanda Howells

## 2021-10-15 DIAGNOSIS — M1611 Unilateral primary osteoarthritis, right hip: Secondary | ICD-10-CM | POA: Diagnosis not present

## 2021-10-15 LAB — BASIC METABOLIC PANEL
Anion gap: 7 (ref 5–15)
BUN: 25 mg/dL — ABNORMAL HIGH (ref 6–20)
CO2: 26 mmol/L (ref 22–32)
Calcium: 8.9 mg/dL (ref 8.9–10.3)
Chloride: 102 mmol/L (ref 98–111)
Creatinine, Ser: 1.08 mg/dL (ref 0.61–1.24)
GFR, Estimated: >60 mL/min (ref >60)
Glucose, Bld: 127 mg/dL — ABNORMAL HIGH (ref 70–99)
Potassium: 4.3 mmol/L (ref 3.5–5.1)
Sodium: 135 mmol/L (ref 135–145)

## 2021-10-15 LAB — CBC
HCT: 38.2 % — ABNORMAL LOW (ref 39.0–52.0)
Hemoglobin: 12.2 g/dL — ABNORMAL LOW (ref 13.0–17.0)
MCH: 26.9 pg (ref 26.0–34.0)
MCHC: 31.9 g/dL (ref 30.0–36.0)
MCV: 84.1 fL (ref 80.0–100.0)
Platelets: 193 10*3/uL (ref 150–400)
RBC: 4.54 MIL/uL (ref 4.22–5.81)
RDW: 14.9 % (ref 11.5–15.5)
WBC: 15.6 10*3/uL — ABNORMAL HIGH (ref 4.0–10.5)
nRBC: 0 % (ref 0.0–0.2)

## 2021-10-15 MED ORDER — OXYCODONE HCL 5 MG PO TABS: 5.0000 mg | ORAL_TABLET | ORAL | 0 refills | Status: DC | PRN

## 2021-10-15 MED ORDER — ASPIRIN 81 MG PO CHEW: 81.0000 mg | CHEWABLE_TABLET | Freq: Two times a day (BID) | ORAL | 0 refills | Status: DC

## 2021-10-15 NOTE — Progress Notes (Signed)
Provided discharge education/instructions, all questions and concerns addressed, Pt not in acute distress, to discharge home with belongings accompanied by daughter. °

## 2021-10-15 NOTE — Progress Notes (Signed)
Subjective: 1 Day Post-Op Procedure(s) (LRB): RIGHT TOTAL HIP ARTHROPLASTY ANTERIOR APPROACH (Right) Patient reports pain as moderate.  Has walked in hallway with PT this AM. No complaints. Tolerating diet well.   Objective: Vital signs in last 24 hours: Temp:  [97.6 F (36.4 C)-98.6 F (37 C)] 98 F (36.7 C) (10/22 0946) Pulse Rate:  [56-84] 78 (10/22 0946) Resp:  [14-20] 16 (10/22 0946) BP: (106-149)/(63-93) 116/86 (10/22 0946) SpO2:  [93 %-100 %] 96 % (10/22 0946)  Intake/Output from previous day: 10/21 0701 - 10/22 0700 In: 3842.7 [P.O.:840; I.V.:2702.7; IV Piggyback:300] Out: 2650 [Urine:2450; Blood:200] Intake/Output this shift: Total I/O In: 190.5 [I.V.:190.5] Out: 220 [Urine:220]  Recent Labs    10/15/21 0314  HGB 12.2*   Recent Labs    10/15/21 0314  WBC 15.6*  RBC 4.54  HCT 38.2*  PLT 193   Recent Labs    10/15/21 0314  NA 135  K 4.3  CL 102  CO2 26  BUN 25*  CREATININE 1.08  GLUCOSE 127*  CALCIUM 8.9   No results for input(s): LABPT, INR in the last 72 hours.  Neurovascular intact Sensation intact distally Dorsiflexion/Plantar flexion intact Incision: dressing C/D/I Compartment soft   Assessment/Plan: 1 Day Post-Op Procedure(s) (LRB): RIGHT TOTAL HIP ARTHROPLASTY ANTERIOR APPROACH (Right) Up with therapy Discharge home with home health if does well with after PT session.       Soua Caltagirone 10/15/2021, 10:41 AM

## 2021-10-15 NOTE — Discharge Summary (Signed)
Patient ID: Alan Coffey MRN: [id number] DOB/AGE: 1966-07-27 55 y.o.  Admit date: 10/14/2021 Discharge date: 10/15/2021  Admission Diagnoses:  Principal Problem:   Unilateral primary osteoarthritis, right hip Active Problems:   Status post total replacement of right hip   Discharge Diagnoses:  Same  Past Medical History:  Diagnosis Date   Arthritis    Dry skin    on chest using goldbond lotion on   Hypertension    Sciatic nerve pain right comes and goes    Surgeries: Procedure(s): RIGHT TOTAL HIP ARTHROPLASTY ANTERIOR APPROACH on 10/14/2021   Consultants:   Discharged Condition: Improved  Hospital Course: Alan Coffey is an 55 y.o. male who was admitted 10/14/2021 for operative treatment ofUnilateral primary osteoarthritis, right hip. Patient has severe unremitting pain that affects sleep, daily activities, and work/hobbies. After pre-op clearance the patient was taken to the operating room on 10/14/2021 and underwent  Procedure(s): RIGHT TOTAL HIP ARTHROPLASTY ANTERIOR APPROACH.    Patient was given perioperative antibiotics:  Anti-infectives (From admission, onward)    Start     Dose/Rate Route Frequency Ordered Stop   10/14/21 1600  ceFAZolin (ANCEF) IVPB 1 g/50 mL premix        1 g 100 mL/hr over 30 Minutes Intravenous Every 6 hours 10/14/21 1324 10/15/21 0025   10/14/21 0730  ceFAZolin (ANCEF) IVPB 2g/100 mL premix        2 g 200 mL/hr over 30 Minutes Intravenous On call to O.R. 10/14/21 0720 10/14/21 1030        Patient was given sequential compression devices, early ambulation, and chemoprophylaxis to prevent DVT.  Patient benefited maximally from hospital stay and there were no complications.    Recent vital signs: Patient Vitals for the past 24 hrs:  BP Temp Temp src Pulse Resp SpO2  10/15/21 0946 116/86 98 F (36.7 C) Oral 78 16 96 %  10/15/21 0803 -- -- -- -- -- 95 %  10/15/21 0522 (!) 149/93 98.2 F (36.8 C) -- 84 17 96 %  10/15/21  0107 119/63 98 F (36.7 C) -- (!) 56 17 93 %  10/14/21 2141 119/64 98.6 F (37 C) -- 61 18 97 %  10/14/21 1511 119/70 98 F (36.7 C) Oral 78 14 95 %  10/14/21 1329 134/83 97.6 F (36.4 C) Oral 74 -- 95 %  10/14/21 1315 126/88 97.7 F (36.5 C) -- 71 15 97 %  10/14/21 1245 106/69 -- -- 72 16 97 %  10/14/21 1230 115/77 -- -- 68 16 100 %  10/14/21 1215 106/77 -- -- 73 18 100 %  10/14/21 1204 113/73 98.4 F (36.9 C) -- 79 20 100 %     Recent laboratory studies:  Recent Labs    10/15/21 0314  WBC 15.6*  HGB 12.2*  HCT 38.2*  PLT 193  NA 135  K 4.3  CL 102  CO2 26  BUN 25*  CREATININE 1.08  GLUCOSE 127*  CALCIUM 8.9     Discharge Medications:   Allergies as of 10/15/2021   No Known Allergies      Medication List     TAKE these medications    amLODipine 5 MG tablet Commonly known as: NORVASC Take 5 mg by mouth daily.   aspirin 81 MG chewable tablet Chew 1 tablet (81 mg total) by mouth 2 (two) times daily.   diclofenac 75 MG EC tablet Commonly known as: VOLTAREN Take 1 tablet (75 mg total) by mouth 2 (two) times daily. What  changed: when to take this   ketoconazole 2 % shampoo Commonly known as: NIZORAL Apply 1 application topically daily.   ketoconazole 200 MG tablet Commonly known as: NIZORAL Take 200 mg by mouth 2 (two) times a week. Take Tuesday and Friday   lisinopril-hydrochlorothiazide 20-12.5 MG tablet Commonly known as: ZESTORETIC Take 1 tablet by mouth daily.   methocarbamol 500 MG tablet Commonly known as: ROBAXIN Take 1 tablet (500 mg total) by mouth every 6 (six) hours as needed for muscle spasms.   NASAL SPRAY NA Place 1 spray into the nose daily as needed (Congestion).   oxyCODONE 5 MG immediate release tablet Commonly known as: Oxy IR/ROXICODONE Take 1-2 tablets (5-10 mg total) by mouth every 4 (four) hours as needed for moderate pain (pain score 4-6).               Durable Medical Equipment  (From admission, onward)            Start     Ordered   10/14/21 1325  DME 3 n 1  Once        10/14/21 1324   10/14/21 1325  DME Walker rolling  Once       Question Answer Comment  Walker: With 5 Inch Wheels   Patient needs a walker to treat with the following condition Status post total replacement of right hip      10/14/21 1324            Diagnostic Studies: DG Pelvis Portable  Result Date: 10/14/2021 CLINICAL DATA:  Status post right hip replacement. EXAM: PORTABLE PELVIS 1-2 VIEWS COMPARISON:  Preoperative radiograph 04/28/2021 FINDINGS: Right hip arthroplasty in expected alignment. No periprosthetic fracture or lucency. Recent postsurgical change includes air and edema in the soft tissues. Left hip arthroplasty is in place. IMPRESSION: Right hip arthroplasty without immediate postoperative complication. Electronically Signed   By: Narda Rutherford M.D.   On: 10/14/2021 14:05   DG C-Arm 1-60 Min-No Report  Result Date: 10/14/2021 Fluoroscopy was utilized by the requesting physician.  No radiographic interpretation.   DG C-Arm 1-60 Min-No Report  Result Date: 10/14/2021 Fluoroscopy was utilized by the requesting physician.  No radiographic interpretation.   DG HIP OPERATIVE UNILAT W OR W/O PELVIS RIGHT  Result Date: 10/14/2021 CLINICAL DATA:  Right total hip replacement. EXAM: OPERATIVE RIGHT HIP (WITH PELVIS IF PERFORMED) TECHNIQUE: Fluoroscopic spot image(s) were submitted for interpretation post-operatively. COMPARISON:  Preoperative radiograph 04/28/2021 FINDINGS: Three fluoroscopic spot views of the pelvis and right hip obtained in the operating room. Right hip arthroplasty in expected alignment. Left hip arthroplasty is included on the pelvis view. Fluoroscopy time 19 seconds. IMPRESSION: Procedural fluoroscopy for right hip arthroplasty. Electronically Signed   By: Narda Rutherford M.D.   On: 10/14/2021 14:04    Disposition: Discharge disposition: 06-Home-Health Care Svc           Follow-up Information     [facility] [place], [place] office) Follow up.   Why: to provide home health physical therapy Contact information: [phone]        Kathryne Hitch, MD Follow up in 2 week(s).   Specialty: Orthopedic Surgery Contact information: [address] [place] [address] [phone]                  Signed: Richardean Canal 10/15/2021, 10:50 AM

## 2021-10-15 NOTE — Discharge Instructions (Signed)
Dental Antibiotics:  In most cases prophylactic antibiotics for Dental procdeures after total joint surgery are not necessary.  Exceptions are as follows:  1. History of prior total joint infection  2. Severely immunocompromised (Organ Transplant, cancer chemotherapy, Rheumatoid biologic meds such as Humera)  3. Poorly controlled diabetes (A1C &gt; 8.0, blood glucose over 200)  If you have one of these conditions, contact your surgeon for an antibiotic prescription, prior to your dental procedure. INSTRUCTIONS AFTER JOINT REPLACEMENT   Remove items at home which could result in a fall. This includes throw rugs or furniture in walking pathways ICE to the affected joint every three hours while awake for 30 minutes at a time, for at least the first 3-5 days, and then as needed for pain and swelling.  Continue to use ice for pain and swelling. You may notice swelling that will progress down to the foot and ankle.  This is normal after surgery.  Elevate your leg when you are not up walking on it.   Continue to use the breathing machine you got in the hospital (incentive spirometer) which will help keep your temperature down.  It is common for your temperature to cycle up and down following surgery, especially at night when you are not up moving around and exerting yourself.  The breathing machine keeps your lungs expanded and your temperature down.   DIET:  As you were doing prior to hospitalization, we recommend a well-balanced diet.  DRESSING / WOUND CARE / SHOWERING  Keep the surgical dressing until follow up.  The dressing is water proof, so you can shower without any extra covering.  IF THE DRESSING FALLS OFF or the wound gets wet inside, change the dressing with sterile gauze.  Please use good hand washing techniques before changing the dressing.  Do not use any lotions or creams on the incision until instructed by your surgeon.    ACTIVITY  Increase activity slowly as tolerated, but  follow the weight bearing instructions below.   No driving for 6 weeks or until further direction given by your physician.  You cannot drive while taking narcotics.  No lifting or carrying greater than 10 lbs. until further directed by your surgeon. Avoid periods of inactivity such as sitting longer than an hour when not asleep. This helps prevent blood clots.  You may return to work once you are authorized by your doctor.     WEIGHT BEARING   Weight bearing as tolerated with assist device (walker, cane, etc) as directed, use it as long as suggested by your surgeon or therapist, typically at least 4-6 weeks.   EXERCISES  Results after joint replacement surgery are often greatly improved when you follow the exercise, range of motion and muscle strengthening exercises prescribed by your doctor. Safety measures are also important to protect the joint from further injury. Any time any of these exercises cause you to have increased pain or swelling, decrease what you are doing until you are comfortable again and then slowly increase them. If you have problems or questions, call your caregiver or physical therapist for advice.   Rehabilitation is important following a joint replacement. After just a few days of immobilization, the muscles of the leg can become weakened and shrink (atrophy).  These exercises are designed to build up the tone and strength of the thigh and leg muscles and to improve motion. Often times heat used for twenty to thirty minutes before working out will loosen up your tissues and help with   improving the range of motion but do not use heat for the first two weeks following surgery (sometimes heat can increase post-operative swelling).   These exercises can be done on a training (exercise) mat, on the floor, on a table or on a bed. Use whatever works the best and is most comfortable for you.    Use music or television while you are exercising so that the exercises are a pleasant  break in your day. This will make your life better with the exercises acting as a break in your routine that you can look forward to.   Perform all exercises about fifteen times, three times per day or as directed.  You should exercise both the operative leg and the other leg as well.  Exercises include:   Quad Sets - Tighten up the muscle on the front of the thigh (Quad) and hold for 5-10 seconds.   Straight Leg Raises - With your knee straight (if you were given a brace, keep it on), lift the leg to 60 degrees, hold for 3 seconds, and slowly lower the leg.  Perform this exercise against resistance later as your leg gets stronger.  Leg Slides: Lying on your back, slowly slide your foot toward your buttocks, bending your knee up off the floor (only go as far as is comfortable). Then slowly slide your foot back down until your leg is flat on the floor again.  Angel Wings: Lying on your back spread your legs to the side as far apart as you can without causing discomfort.  Hamstring Strength:  Lying on your back, push your heel against the floor with your leg straight by tightening up the muscles of your buttocks.  Repeat, but this time bend your knee to a comfortable angle, and push your heel against the floor.  You may put a pillow under the heel to make it more comfortable if necessary.   A rehabilitation program following joint replacement surgery can speed recovery and prevent re-injury in the future due to weakened muscles. Contact your doctor or a physical therapist for more information on knee rehabilitation.    CONSTIPATION  Constipation is defined medically as fewer than three stools per week and severe constipation as less than one stool per week.  Even if you have a regular bowel pattern at home, your normal regimen is likely to be disrupted due to multiple reasons following surgery.  Combination of anesthesia, postoperative narcotics, change in appetite and fluid intake all can affect your  bowels.   YOU MUST use at least one of the following options; they are listed in order of increasing strength to get the job done.  They are all available over the counter, and you may need to use some, POSSIBLY even all of these options:    Drink plenty of fluids (prune juice may be helpful) and high fiber foods Colace 100 mg by mouth twice a day  Senokot for constipation as directed and as needed Dulcolax (bisacodyl), take with full glass of water  Miralax (polyethylene glycol) once or twice a day as needed.  If you have tried all these things and are unable to have a bowel movement in the first 3-4 days after surgery call either your surgeon or your primary doctor.    If you experience loose stools or diarrhea, hold the medications until you stool forms back up.  If your symptoms do not get better within 1 week or if they get worse, check with your doctor.    If you experience "the worst abdominal pain ever" or develop nausea or vomiting, please contact the office immediately for further recommendations for treatment.   ITCHING:  If you experience itching with your medications, try taking only a single pain pill, or even half a pain pill at a time.  You can also use Benadryl over the counter for itching or also to help with sleep.   TED HOSE STOCKINGS:  Use stockings on both legs until for at least 2 weeks or as directed by physician office. They may be removed at night for sleeping.  MEDICATIONS:  See your medication summary on the "After Visit Summary" that nursing will review with you.  You may have some home medications which will be placed on hold until you complete the course of blood thinner medication.  It is important for you to complete the blood thinner medication as prescribed.  PRECAUTIONS:  If you experience chest pain or shortness of breath - call 911 immediately for transfer to the hospital emergency department.   If you develop a fever greater that 101 F, purulent drainage  from wound, increased redness or drainage from wound, foul odor from the wound/dressing, or calf pain - CONTACT YOUR SURGEON.                                                   FOLLOW-UP APPOINTMENTS:  If you do not already have a post-op appointment, please call the office for an appointment to be seen by your surgeon.  Guidelines for how soon to be seen are listed in your "After Visit Summary", but are typically between 1-4 weeks after surgery.  OTHER INSTRUCTIONS:   Knee Replacement:  Do not place pillow under knee, focus on keeping the knee straight while resting. CPM instructions: 0-90 degrees, 2 hours in the morning, 2 hours in the afternoon, and 2 hours in the evening. Place foam block, curve side up under heel at all times except when in CPM or when walking.  DO NOT modify, tear, cut, or change the foam block in any way.  POST-OPERATIVE OPIOID TAPER INSTRUCTIONS: It is important to wean off of your opioid medication as soon as possible. If you do not need pain medication after your surgery it is ok to stop day one. Opioids include: Codeine, Hydrocodone(Norco, Vicodin), Oxycodone(Percocet, oxycontin) and hydromorphone amongst others.  Long term and even short term use of opiods can cause: Increased pain response Dependence Constipation Depression Respiratory depression And more.  Withdrawal symptoms can include Flu like symptoms Nausea, vomiting And more Techniques to manage these symptoms Hydrate well Eat regular healthy meals Stay active Use relaxation techniques(deep breathing, meditating, yoga) Do Not substitute Alcohol to help with tapering If you have been on opioids for less than two weeks and do not have pain than it is ok to stop all together.  Plan to wean off of opioids This plan should start within one week post op of your joint replacement. Maintain the same interval or time between taking each dose and first decrease the dose.  Cut the total daily intake of  opioids by one tablet each day Next start to increase the time between doses. The last dose that should be eliminated is the evening dose.   MAKE SURE YOU:  Understand these instructions.  Get help right away if you are not doing   well or get worse.    Thank you for letting us be a part of your medical care team.  It is a privilege we respect greatly.  We hope these instructions will help you stay on track for a fast and full recovery!       

## 2021-10-15 NOTE — Plan of Care (Signed)

## 2021-10-15 NOTE — TOC Transition Note (Signed)
Transition of Care [facility]) - CM/SW Discharge Note   Patient Details  Name: Alan Coffey MRN: [id number] Date of Birth: Dec 10, 1966  [facility]) CM/SW Contact:  Lennart Pall, LCSW Phone Number: 10/15/2021, 9:56 AM   Clinical Narrative:    Met with pt and confirming he has all needed DME at home.  Pt will be followed for HHPT via [facility] office as pt going to daughter's home in [place], [place]).  No further TOC needs.   Final next level of care: [place] Barriers to Discharge: No Barriers Identified   Patient Goals and CMS Choice Patient states their goals for this hospitalization and ongoing recovery are:: return home      Discharge Placement                       Discharge Plan and Services                DME Arranged: N/A DME Agency: NA       HH Arranged: PT HH Agency: [place] Date [place]: 10/15/21 Time [place]: [phone] Representative spoke with at [place]: [place] Determinants of Health ([place]) Interventions     Readmission Risk Interventions No flowsheet data found.

## 2021-10-15 NOTE — Progress Notes (Signed)
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  Pt eager for dc home this date.   Recommendations for follow up therapy are one component of a multi-disciplinary discharge planning process, led by the attending physician.  Recommendations may be updated based on patient status, additional functional criteria and insurance authorization.  Follow Up Recommendations  Home health PT;Follow surgeon's recommendation for DC plan and follow-up therapies     Equipment Recommendations  None recommended by PT    Recommendations for Other Services       Precautions / Restrictions Precautions Precautions: Fall Restrictions Weight Bearing Restrictions: No RLE Weight Bearing: Weight bearing as tolerated     Mobility  Bed Mobility Overal bed mobility: Needs Assistance Bed Mobility: Supine to Sit;Sit to Supine     Supine to sit: Supervision Sit to supine: Min guard   General bed mobility comments: cues for sequence and use of L LE to self assist.    Transfers Overall transfer level: Needs assistance Equipment used: Rolling walker (2 wheeled) Transfers: Sit to/from Stand Sit to Stand: Min guard;Supervision         General transfer comment: pt self-cues for LE management and use of UEs to self assist  Ambulation/Gait Ambulation/Gait assistance: Min guard;Supervision Gait Distance (Feet): 300 Feet Assistive device: Rolling walker (2 wheeled) Gait Pattern/deviations: Step-to pattern;Step-through pattern;Shuffle;Trunk flexed     General Gait Details: min cues for posture, position from RW and intial sequence   Stairs Stairs: Yes Stairs assistance: Min guard Stair  Management: Two rails;Step to pattern;Forwards Number of Stairs: 5 General stair comments: cues for sequence   Wheelchair Mobility    Modified Rankin (Stroke Patients Only)       Balance Overall balance assessment: Mild deficits observed, not formally tested                                          Cognition Arousal/Alertness: Awake/alert Behavior During Therapy: WFL for tasks assessed/performed Overall Cognitive Status: Within Functional Limits for tasks assessed                                        Exercises Total Joint Exercises Ankle Circles/Pumps: AROM;Both;15 reps;Supine Quad Sets: AROM;Both;10 reps;Supine Heel Slides: AAROM;Right;20 reps;Supine Hip ABduction/ADduction: AAROM;Right;15 reps;Supine    General Comments        Pertinent Vitals/Pain Pain Assessment: 0-10 Pain Score: 4  Pain Location: R hip/thigh Pain Descriptors / Indicators: Aching;Sore Pain Intervention(s): Limited activity within patient's tolerance;Monitored during session;Premedicated before session;Ice applied    Home Living                      Prior Function            PT Goals (current goals can now be found in the care plan section) Acute Rehab PT Goals Patient Stated Goal: Regain IND PT Goal Formulation: With patient Time For Goal Achievement: 10/21/21 Potential to Achieve Goals: Good Progress towards PT goals: Progressing toward goals    Frequency  7X/week      PT Plan Current plan remains appropriate    Co-evaluation              AM-PAC PT "6 Clicks" Mobility   Outcome Measure  Help needed turning from your back to your side while in a flat bed without using bedrails?: A Little Help needed moving from lying on your back to sitting on the side of a flat bed without using bedrails?: A Little Help needed moving to and from a bed to a chair (including a wheelchair)?: A Little Help needed standing up from a chair  using your arms (e.g., wheelchair or bedside chair)?: A Little Help needed to walk in hospital room?: A Little Help needed climbing 3-5 steps with a railing? : A Little 6 Click Score: 18    End of Session Equipment Utilized During Treatment: Gait belt Activity Tolerance: Patient tolerated treatment well Patient left: in bed;with call bell/phone within reach Nurse Communication: Mobility status PT Visit Diagnosis: Difficulty in walking, not elsewhere classified (R26.2)     Time: 3335-4562 PT Time Calculation (min) (ACUTE ONLY): 26 min  Charges:  $Gait Training: 8-22 mins $Therapeutic Exercise: 8-22 mins                     Mauro Kaufmann PT Acute Rehabilitation Services Pager [phone] Office [phone]    Coby [place] 10/15/2021, 12:13 PM

## 2021-10-17 ENCOUNTER — Telehealth: Payer: Self-pay | Admitting: Orthopaedic Surgery

## 2021-10-17 NOTE — Telephone Encounter (Signed)
Lila with [facility] called requesting orders 3x a week for 2 weeks. Weight baring as tolerated and aquacel getting take off at next appt.   [phone]

## 2021-10-17 NOTE — Telephone Encounter (Signed)
LMOM for Lila giving her verbal on therapy, weight bearing as much as possible and she doesn't need to take the bandage off unless for some reason worried about incision

## 2021-10-19 ENCOUNTER — Encounter (HOSPITAL_COMMUNITY): Payer: Self-pay | Admitting: Orthopaedic Surgery

## 2021-10-27 ENCOUNTER — Encounter: Payer: Self-pay | Admitting: Orthopaedic Surgery

## 2021-10-27 ENCOUNTER — Ambulatory Visit (INDEPENDENT_AMBULATORY_CARE_PROVIDER_SITE_OTHER): Payer: 59 | Admitting: Orthopaedic Surgery

## 2021-10-27 DIAGNOSIS — Z96641 Presence of right artificial hip joint: Secondary | ICD-10-CM

## 2021-10-27 DIAGNOSIS — Z96642 Presence of left artificial hip joint: Secondary | ICD-10-CM

## 2021-10-27 NOTE — Progress Notes (Signed)
The patient is now 2 weeks status post a right total hip arthroplasty through direct and approach.  We replaced his left hip a few months ago.  He is walking with a cane and reports good range of motion and strength and said things are going well for him.  On exam his left hip incision looks great.  His right hip incision looks good.  I remove the staples from the right hip and did aspirate 60 cc of a seroma from the right hip.  I had to do this on the left hip as well.  He is comfortable overall.  He would like to get back on a stationary bike.  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  From my standpoint I will see him back in 4 weeks to see how he is doing overall but no x-rays are needed.  All question concerns were answered and addressed.

## 2021-11-24 ENCOUNTER — Encounter: Payer: Self-pay | Admitting: Orthopaedic Surgery

## 2021-11-24 ENCOUNTER — Ambulatory Visit (INDEPENDENT_AMBULATORY_CARE_PROVIDER_SITE_OTHER): Payer: 59 | Admitting: Orthopaedic Surgery

## 2021-11-24 ENCOUNTER — Other Ambulatory Visit: Payer: Self-pay

## 2021-11-24 DIAGNOSIS — Z96641 Presence of right artificial hip joint: Secondary | ICD-10-CM

## 2021-11-24 DIAGNOSIS — Z96642 Presence of left artificial hip joint: Secondary | ICD-10-CM

## 2021-11-24 NOTE — Progress Notes (Signed)
The patient is now 6-week status post a right total hip arthroplasty and just over 4 months status post a left total hip arthroplasty.  He is walking without assistive device.  He says his gait is normal and he is standing up straighter.  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  He says his stiffness is minimal and he is doing very well overall with range of motion and strength.  On exam both hips move smoothly and fluidly.  His leg lengths are equal.  I watched him ambulate he does not walk with a limp at all.  At this point I do not need to see him back for 6 months unless he needs to be seen sooner.  At that visit would like a standing low AP pelvis and lateral both hips.  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

## 2022-05-09 ENCOUNTER — Inpatient Hospital Stay (HOSPITAL_COMMUNITY)
Admission: EM | Admit: 2022-05-09 | Discharge: 2022-05-13 | DRG: 286 | Disposition: A | Discharge status: Home / Self Care | Payer: 59 | Attending: Internal Medicine | Admitting: Internal Medicine

## 2022-05-09 ENCOUNTER — Other Ambulatory Visit: Payer: Self-pay

## 2022-05-09 ENCOUNTER — Emergency Department (HOSPITAL_COMMUNITY): Payer: 59

## 2022-05-09 ENCOUNTER — Encounter (HOSPITAL_COMMUNITY): Payer: Self-pay | Admitting: Emergency Medicine

## 2022-05-09 DIAGNOSIS — I493 Ventricular premature depolarization: Secondary | ICD-10-CM | POA: Diagnosis present

## 2022-05-09 DIAGNOSIS — I2510 Atherosclerotic heart disease of native coronary artery without angina pectoris: Secondary | ICD-10-CM | POA: Diagnosis present

## 2022-05-09 DIAGNOSIS — I214 Non-ST elevation (NSTEMI) myocardial infarction: Secondary | ICD-10-CM

## 2022-05-09 DIAGNOSIS — E785 Hyperlipidemia, unspecified: Secondary | ICD-10-CM | POA: Diagnosis present

## 2022-05-09 DIAGNOSIS — I10 Essential (primary) hypertension: Secondary | ICD-10-CM | POA: Diagnosis present

## 2022-05-09 DIAGNOSIS — Z79899 Other long term (current) drug therapy: Secondary | ICD-10-CM

## 2022-05-09 DIAGNOSIS — I509 Heart failure, unspecified: Principal | ICD-10-CM

## 2022-05-09 DIAGNOSIS — E66812 Obesity, class 2: Secondary | ICD-10-CM | POA: Diagnosis present

## 2022-05-09 DIAGNOSIS — R008 Other abnormalities of heart beat: Secondary | ICD-10-CM | POA: Diagnosis present

## 2022-05-09 DIAGNOSIS — R0602 Shortness of breath: Secondary | ICD-10-CM | POA: Diagnosis not present

## 2022-05-09 DIAGNOSIS — M5431 Sciatica, right side: Secondary | ICD-10-CM | POA: Diagnosis present

## 2022-05-09 DIAGNOSIS — E876 Hypokalemia: Secondary | ICD-10-CM

## 2022-05-09 DIAGNOSIS — Z20822 Contact with and (suspected) exposure to covid-19: Secondary | ICD-10-CM | POA: Diagnosis present

## 2022-05-09 DIAGNOSIS — I110 Hypertensive heart disease with heart failure: Secondary | ICD-10-CM | POA: Diagnosis not present

## 2022-05-09 DIAGNOSIS — I5021 Acute systolic (congestive) heart failure: Secondary | ICD-10-CM | POA: Diagnosis present

## 2022-05-09 DIAGNOSIS — I472 Ventricular tachycardia, unspecified: Secondary | ICD-10-CM | POA: Diagnosis present

## 2022-05-09 DIAGNOSIS — I428 Other cardiomyopathies: Secondary | ICD-10-CM | POA: Diagnosis present

## 2022-05-09 DIAGNOSIS — M1990 Unspecified osteoarthritis, unspecified site: Secondary | ICD-10-CM | POA: Diagnosis present

## 2022-05-09 DIAGNOSIS — Z96643 Presence of artificial hip joint, bilateral: Secondary | ICD-10-CM | POA: Diagnosis present

## 2022-05-09 DIAGNOSIS — Z8249 Family history of ischemic heart disease and other diseases of the circulatory system: Secondary | ICD-10-CM

## 2022-05-09 DIAGNOSIS — E669 Obesity, unspecified: Secondary | ICD-10-CM | POA: Diagnosis present

## 2022-05-09 DIAGNOSIS — I3139 Other pericardial effusion (noninflammatory): Secondary | ICD-10-CM | POA: Diagnosis present

## 2022-05-09 DIAGNOSIS — Z6837 Body mass index (BMI) 37.0-37.9, adult: Secondary | ICD-10-CM

## 2022-05-09 DIAGNOSIS — F17210 Nicotine dependence, cigarettes, uncomplicated: Secondary | ICD-10-CM | POA: Diagnosis present

## 2022-05-09 HISTORY — DX: Unspecified osteoarthritis, unspecified site: M19.90

## 2022-05-09 LAB — CBC
HCT: 40.9 % (ref 39.0–52.0)
Hemoglobin: 13 g/dL (ref 13.0–17.0)
MCH: 26.6 pg (ref 26.0–34.0)
MCHC: 31.8 g/dL (ref 30.0–36.0)
MCV: 83.6 fL (ref 80.0–100.0)
Platelets: 158 10*3/uL (ref 150–400)
RBC: 4.89 MIL/uL (ref 4.22–5.81)
RDW: 15.9 % — ABNORMAL HIGH (ref 11.5–15.5)
WBC: 9.4 10*3/uL (ref 4.0–10.5)
nRBC: 0 % (ref 0.0–0.2)

## 2022-05-09 LAB — TROPONIN I (HIGH SENSITIVITY)
Troponin I (High Sensitivity): 52 ng/L — ABNORMAL HIGH (ref <18)
Troponin I (High Sensitivity): 72 ng/L — ABNORMAL HIGH (ref <18)
Troponin I (High Sensitivity): 60 ng/L — ABNORMAL HIGH (ref <18)

## 2022-05-09 LAB — BASIC METABOLIC PANEL
Anion gap: 7 (ref 5–15)
BUN: 10 mg/dL (ref 6–20)
CO2: 26 mmol/L (ref 22–32)
Calcium: 8.8 mg/dL — ABNORMAL LOW (ref 8.9–10.3)
Chloride: 107 mmol/L (ref 98–111)
Creatinine, Ser: 0.98 mg/dL (ref 0.61–1.24)
GFR, Estimated: >60 mL/min (ref >60)
Glucose, Bld: 122 mg/dL — ABNORMAL HIGH (ref 70–99)
Potassium: 3.6 mmol/L (ref 3.5–5.1)
Sodium: 140 mmol/L (ref 135–145)

## 2022-05-09 LAB — BRAIN NATRIURETIC PEPTIDE: B Natriuretic Peptide: 772.9 pg/mL — ABNORMAL HIGH (ref 0.0–100.0)

## 2022-05-09 LAB — RESP PANEL BY RT-PCR (FLU A&B, COVID) ARPGX2
Influenza A by PCR: NEGATIVE
Influenza B by PCR: NEGATIVE
SARS Coronavirus 2 by RT PCR: NEGATIVE

## 2022-05-09 IMAGING — CR DG CHEST 2V
2 series · 2 of 2 positions shown · non-contrast
Comparison: [DATE]

CLINICAL DATA: SOB and right lower extremity swelling x3 days. Pt
states he has HTN and has been prescribed meds, but he has not been
able to take them in APPROX.2 months. Smoker.

EXAM:
CHEST - 2 VIEW

[chest pa]
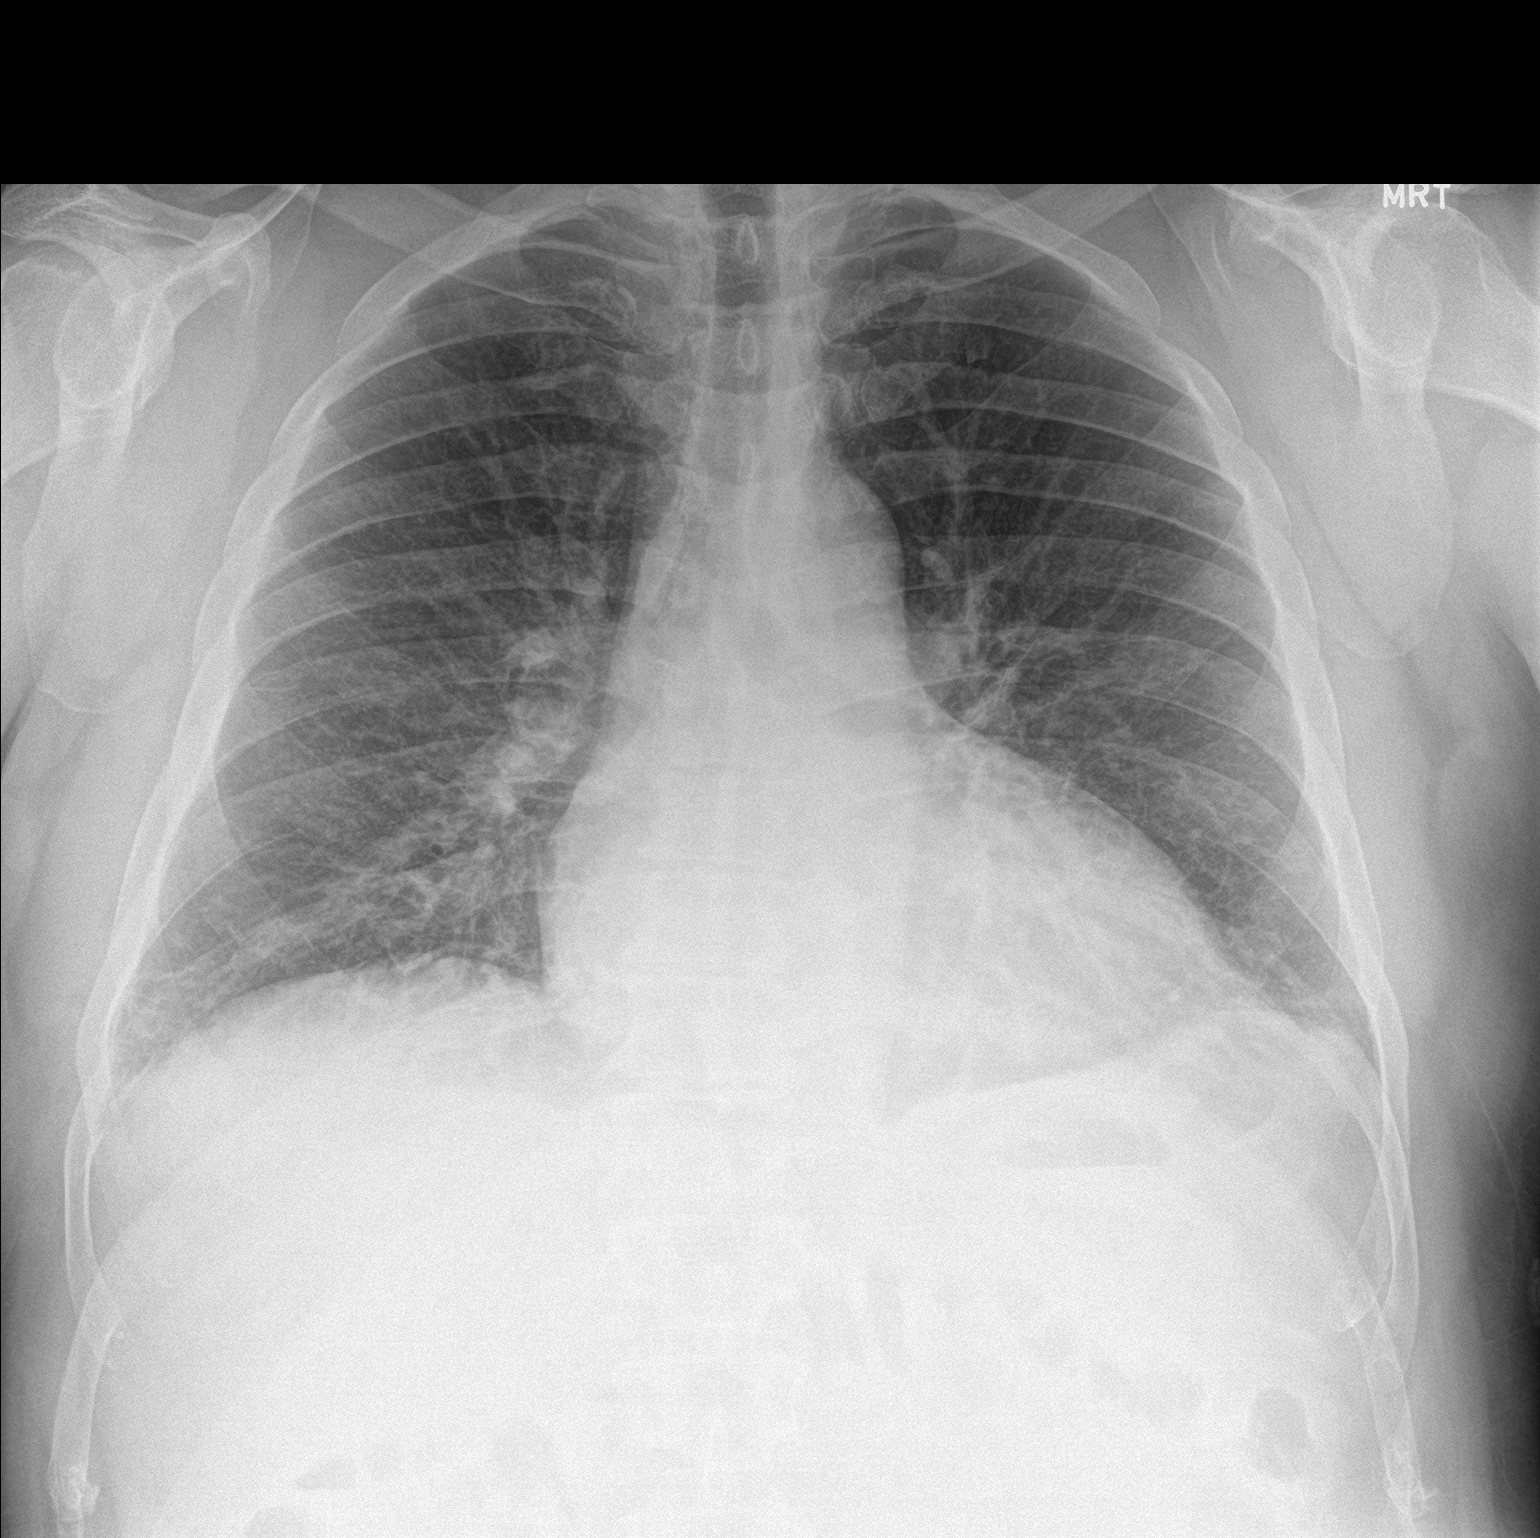

[chest lat]
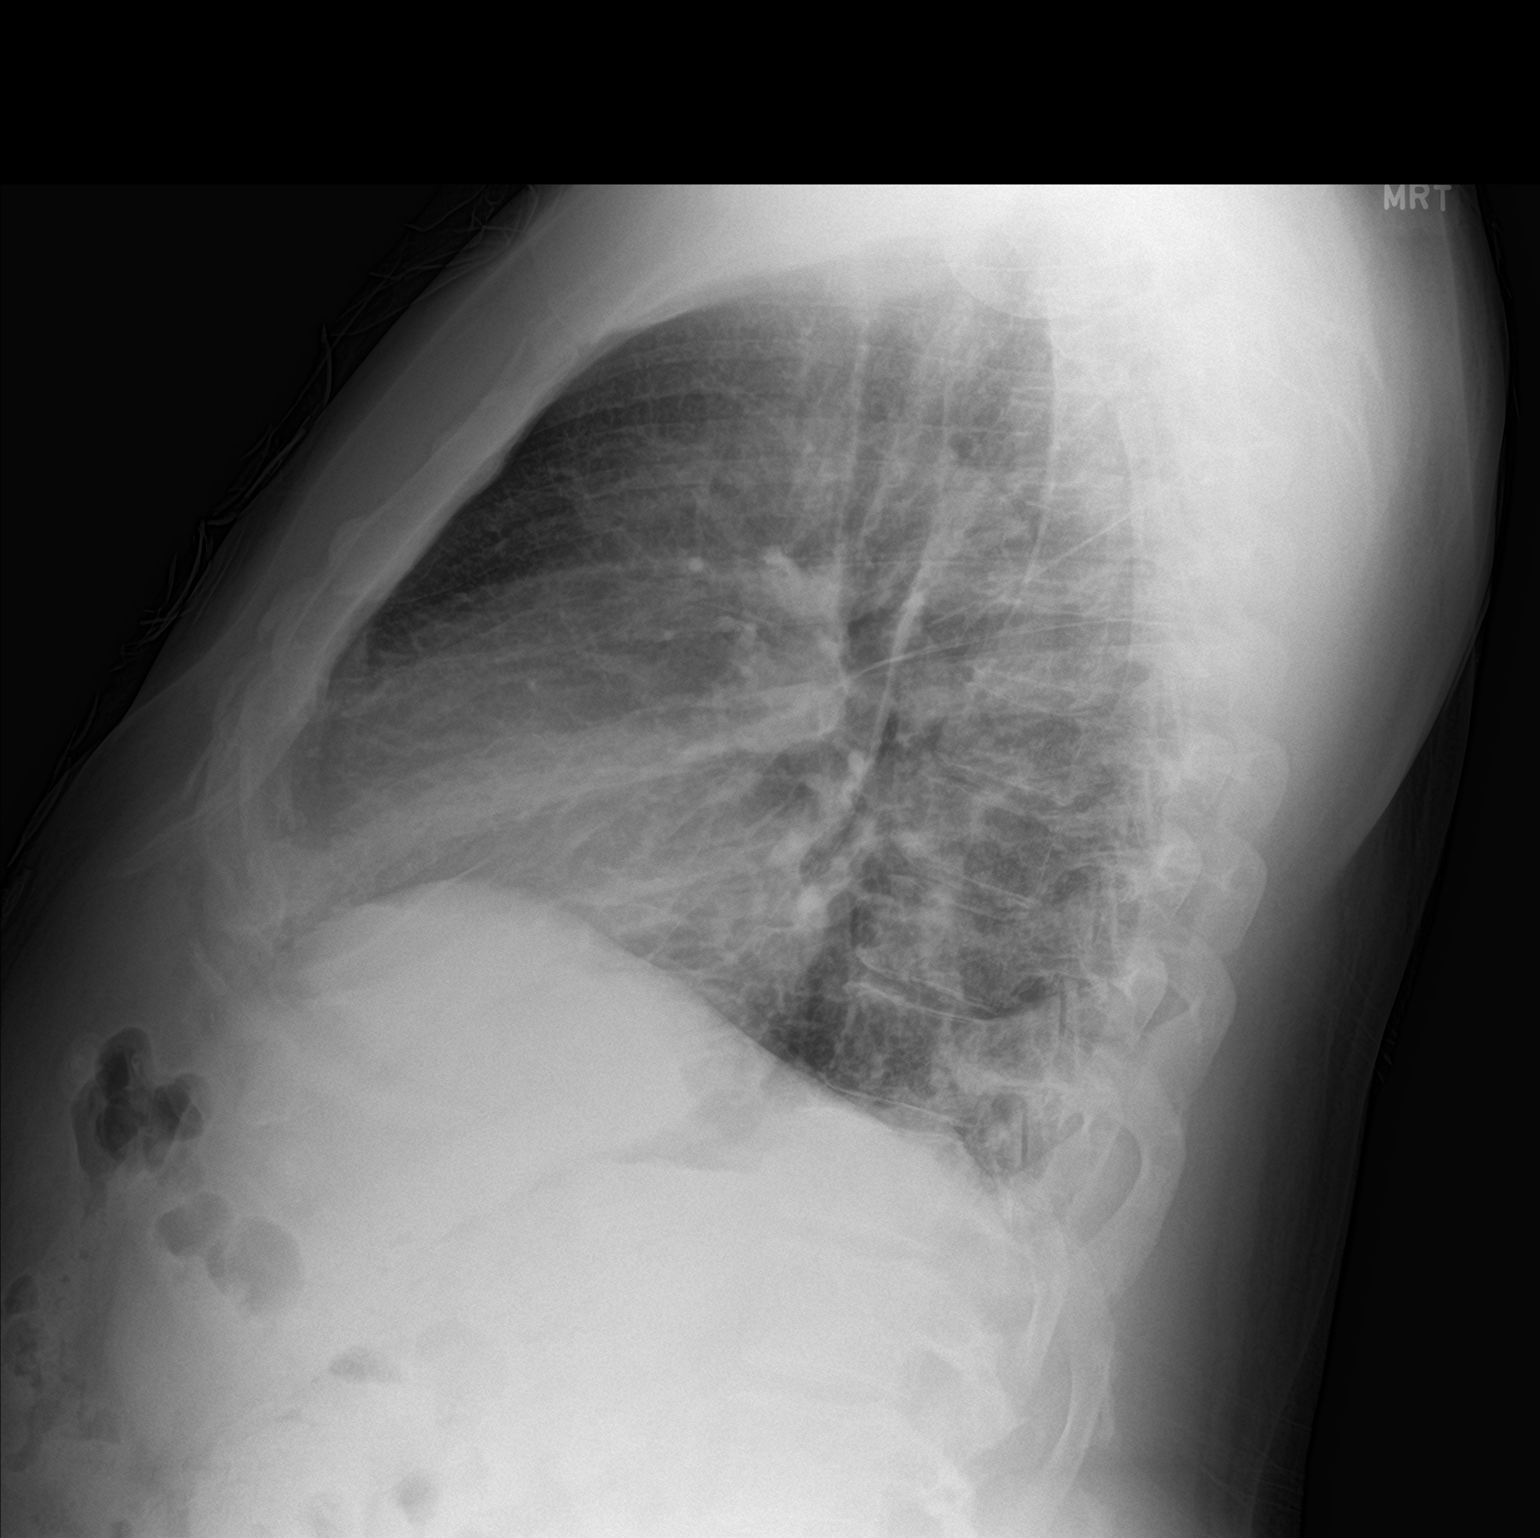

[2 of 2 positions shown; findings below may reference images not displayed]

FINDINGS: Coarse perihilar and bibasilar interstitial opacities, new since
previous. There is some patchy subsegmental atelectasis or scarring
in the lung bases.

Heart size and mediastinal contours are within normal limits.

No effusion.

Visualized bones unremarkable.
IMPRESSION: Mild perihilar and bibasilar interstitial edema or infiltrates, new
since previous.

## 2022-05-09 MED ORDER — ASPIRIN 81 MG PO CHEW
324.0000 mg | CHEWABLE_TABLET | Freq: Once | ORAL | Status: AC
  Administered 2022-05-09: 324 mg via ORAL
  Filled 2022-05-09: qty 4

## 2022-05-09 MED ORDER — AMLODIPINE BESYLATE 5 MG PO TABS
5.0000 mg | ORAL_TABLET | Freq: Once | ORAL | Status: AC
  Administered 2022-05-09: 5 mg via ORAL
  Filled 2022-05-09: qty 1

## 2022-05-09 MED ORDER — FUROSEMIDE 10 MG/ML IJ SOLN
40.0000 mg | Freq: Once | INTRAMUSCULAR | Status: AC
  Administered 2022-05-09: 40 mg via INTRAVENOUS
  Filled 2022-05-09: qty 4

## 2022-05-09 NOTE — ED Triage Notes (Signed)
Patient complains of shortness of breath that is worse with exertion and when laying flat. Patient denies history of heart failure, denies pain. Patient is alert, oriented, and in no apparent distress at this time. ?

## 2022-05-09 NOTE — ED Provider Notes (Signed)
?[facility] ?Provider Note ? ? ?CSN: [id number] ?Arrival date & time: 05/09/22  1124 ? ?  ? ?History ? ?Chief Complaint  ?Patient presents with  ? Shortness of Breath  ? ? ?Alan Coffey is a 56 y.o. male. ? ? Patient as above with significant medical history as below, including hypertension, hyperlipidemia who presents to the ED with complaint of dyspnea.  Patient reports worsening dyspnea over the past week.  Symptoms worse in the past 24 hours.  Worse when he lies down flat at nighttime.  Somewhat worsened with exertion.  Does not use home oxygen.  Does not follow with cardiology.  Is having some mild chest tightness/discomfort last night and early this morning, no current chest pain at this time.  Mild increase to his lower extremity swelling bilateral.  Feels his abdomen is more swollen than normal as well.  He has nonproductive cough over the past few days ? ?He is breathing comfortably on room air during initial assessment. ? ? ? ? ?Past Medical History: ?No date: Arthritis ?No date: Dry skin ?    Comment:  on chest using goldbond lotion on ?No date: Hypertension ?right comes and goes: Sciatic nerve pain ? ?Past Surgical History: ?No date: cyst removed left shoulder  ?06/11/2020: MASS EXCISION; Left ?    Comment:  Procedure: REMOVAL OF LEFT SHOULDER SUBCUTANEOUS MASS;   ?             Surgeon: Michael Boston, MD;  Location: [facility]  ?             [facility];  Service: General;  Laterality: Left;   ?             MAC VS GENERAL (ANESTHESIA CHOICE) ?07/01/2021: TOTAL HIP ARTHROPLASTY; Left ?    Comment:  Procedure: LEFT TOTAL HIP ARTHROPLASTY ANTERIOR  ?             APPROACH;  Surgeon: Mcarthur Rossetti, MD;   ?             Location: [facility];  Service: Orthopedics;  Laterality:  ?             Left; ?10/14/2021: TOTAL HIP ARTHROPLASTY; Right ?    Comment:  Procedure: RIGHT TOTAL HIP ARTHROPLASTY ANTERIOR  ?             APPROACH;  Surgeon: Mcarthur Rossetti, MD;   ?             Location: [facility];  Service: Orthopedics;  Laterality:  ?             Right;  ? ? ?The history is provided by the patient. No language interpreter was used.  ?Shortness of Breath ?Associated symptoms: chest pain and cough   ?Associated symptoms: no abdominal pain, no fever, no headaches, no rash and no vomiting   ? ?  ? ?Home Medications ?Prior to Admission medications   ?Medication Sig Start Date End Date Taking? Authorizing Provider  ?acetaminophen (TYLENOL) 325 MG tablet Take 325 mg by mouth every 6 (six) hours as needed for moderate pain or headache.   Yes [provider]  ?amLODipine (NORVASC) 5 MG tablet Take 5 mg by mouth daily. 06/28/21  Yes [provider]  ?diphenhydrAMINE (BENADRYL) 25 MG tablet Take 25 mg by mouth every 6 (six) hours as needed for allergies.   Yes [provider]  ?Hypromellose Vertell Limber ALLERGY NASAL SPRAY NA) Place 1  spray into both nostrils at bedtime.   Yes [provider]  ?lisinopril-hydrochlorothiazide (ZESTORETIC) 20-12.5 MG tablet Take 1 tablet by mouth daily. ?Patient not taking: Reported on 05/09/2022    [provider]  ?   ? ?Allergies    ?Patient has no known allergies.   ? ?Review of Systems   ?Review of Systems  ?Constitutional:  Negative for chills and fever.  ?HENT:  Negative for facial swelling and trouble swallowing.   ?Eyes:  Negative for photophobia and visual disturbance.  ?Respiratory:  Positive for cough and shortness of breath.   ?Cardiovascular:  Positive for chest pain and leg swelling. Negative for palpitations.  ?Gastrointestinal:  Positive for abdominal distention. Negative for abdominal pain, nausea and vomiting.  ?Endocrine: Negative for polydipsia and polyuria.  ?Genitourinary:  Negative for difficulty urinating and hematuria.  ?Musculoskeletal:  Negative for gait problem and joint swelling.  ?Skin:  Negative for pallor and rash.  ?Neurological:  Negative for syncope and headaches.   ?Psychiatric/Behavioral:  Negative for agitation and confusion.   ? ?Physical Exam ?Updated Vital Signs ?BP (!) 156/86   Pulse 86   Temp 98.7 ?F (37.1 ?C) (Oral)   Resp (!) 24   SpO2 99%  ?Physical Exam ?Vitals and nursing note reviewed.  ?Constitutional:   ?   General: He is not in acute distress. ?   Appearance: Normal appearance. He is well-developed. He is not diaphoretic.  ?HENT:  ?   Head: Normocephalic and atraumatic.  ?   Right Ear: External ear normal.  ?   Left Ear: External ear normal.  ?   Mouth/Throat:  ?   Mouth: Mucous membranes are moist.  ?Eyes:  ?   General: No scleral icterus. ?Cardiovascular:  ?   Rate and Rhythm: Normal rate and regular rhythm.  ?   Pulses: Normal pulses.  ?   Heart sounds: Normal heart sounds.  ?Pulmonary:  ?   Effort: Pulmonary effort is normal. Tachypnea present. No respiratory distress.  ?   Breath sounds: Decreased breath sounds present.  ?Abdominal:  ?   General: Abdomen is flat.  ?   Palpations: Abdomen is soft.  ?   Tenderness: There is no abdominal tenderness.  ?Musculoskeletal:     ?   General: Normal range of motion.  ?   Cervical back: Normal range of motion.  ?   Right lower leg: Edema present.  ?   Left lower leg: Edema present.  ?Skin: ?   General: Skin is warm and dry.  ?   Capillary Refill: Capillary refill takes less than 2 seconds.  ?Neurological:  ?   Mental Status: He is alert and oriented to person, place, and time.  ?Psychiatric:     ?   Mood and Affect: Mood normal.     ?   Behavior: Behavior normal.  ? ? ?ED Results / Procedures / Treatments   ?Labs ?(all labs ordered are listed, but only abnormal results are displayed) ?Labs Reviewed  ?BASIC METABOLIC PANEL - Abnormal; Notable for the following components:  ?    Result Value  ? Glucose, Bld 122 (*)   ? Calcium 8.8 (*)   ? All other components within normal limits  ?CBC - Abnormal; Notable for the following components:  ? RDW 15.9 (*)   ? All other components within normal limits  ?BRAIN NATRIURETIC  PEPTIDE - Abnormal; Notable for the following components:  ? B Natriuretic Peptide 772.9 (*)   ? All other components  within normal limits  ?TROPONIN I (HIGH SENSITIVITY) - Abnormal; Notable for the following components:  ? Troponin I (High Sensitivity) 52 (*)   ? All other components within normal limits  ?TROPONIN I (HIGH SENSITIVITY) - Abnormal; Notable for the following components:  ? Troponin I (High Sensitivity) 72 (*)   ? All other components within normal limits  ?TROPONIN I (HIGH SENSITIVITY) - Abnormal; Notable for the following components:  ? Troponin I (High Sensitivity) 60 (*)   ? All other components within normal limits  ?RESP PANEL BY RT-PCR (FLU A&B, COVID) ARPGX2  ? ? ?EKG ?EKG Interpretation ? ?Date/Time:  Tuesday May 09 2022 11:46:12 EDT ?Ventricular Rate:  100 ?PR Interval:  162 ?QRS Duration: 90 ?QT Interval:  378 ?QTC Calculation: 487 ?R Axis:   88 ?Text Interpretation: Sinus rhythm with frequent and consecutive Premature ventricular complexes and Fusion complexes ST & T wave abnormality, consider lateral ischemia Prolonged QT Abnormal ECG When compared with ECG of 20-Jun-2021 10:32, PREVIOUS ECG IS PRESENT t wave inversions noted lateral note seen on prior Confirmed by Wynona Dove ([id number]) on 05/09/2022 9:16:44 PM ? ?Radiology ?DG Chest 2 View ? ?Result Date: 05/09/2022 ?CLINICAL DATA:  SOB and right lower extremity swelling x3 days. Pt states he has HTN and has been prescribed meds, but he has not been able to take them in APPROX.2 months. Smoker. EXAM: CHEST - 2 VIEW COMPARISON:  09/07/2007 FINDINGS: Coarse perihilar and bibasilar interstitial opacities, new since previous. There is some patchy subsegmental atelectasis or scarring in the lung bases. Heart size and mediastinal contours are within normal limits. No effusion. Visualized bones unremarkable. IMPRESSION: Mild perihilar and bibasilar interstitial edema or infiltrates, new since previous. Electronically Signed   By: Lucrezia Europe M.D.    On: 05/09/2022 12:13   ? ?Procedures ?Marland KitchenCritical Care ?Performed by: Jeanell Sparrow, DO ?Authorized by: Jeanell Sparrow, DO  ? ?Critical care provider statement:  ?  Critical care time (minutes):  47 ?  Critical care

## 2022-05-09 NOTE — ED Provider Triage Note (Signed)
Emergency Medicine Provider Triage Evaluation Note ? ?DEWAUN KINZLER , a 56 y.o. male  was evaluated in triage.  Pt complains of shortness of breath.  Denies chest pain.  Does endorse nonproductive cough.  Denies peripheral edema. ? ?Review of Systems  ?Positive: As above ?Negative: As above ? ?Physical Exam  ?BP (!) 167/112 (BP Location: Right Arm)   Pulse 97   Temp 98.7 ?F (37.1 ?C) (Oral)   Resp 18   SpO2 95%  ?Gen:   Awake, no distress   ?Resp:  Normal effort  ?MSK:   Moves extremities without difficulty  ?Other:  Without peripheral edema ? ?Medical Decision Making  ?Medically screening exam initiated at 11:51 AM.  Appropriate orders placed.  AJAY STRUBEL was informed that the remainder of the evaluation will be completed by another provider, this initial triage assessment does not replace that evaluation, and the importance of remaining in the ED until their evaluation is complete. ? ? ?  ?Marita Kansas, PA-C ?05/09/22 1152 ? ?

## 2022-05-10 ENCOUNTER — Observation Stay (HOSPITAL_COMMUNITY): Payer: 59

## 2022-05-10 DIAGNOSIS — E785 Hyperlipidemia, unspecified: Secondary | ICD-10-CM

## 2022-05-10 DIAGNOSIS — Z8249 Family history of ischemic heart disease and other diseases of the circulatory system: Secondary | ICD-10-CM | POA: Diagnosis not present

## 2022-05-10 DIAGNOSIS — R008 Other abnormalities of heart beat: Secondary | ICD-10-CM | POA: Diagnosis present

## 2022-05-10 DIAGNOSIS — M1990 Unspecified osteoarthritis, unspecified site: Secondary | ICD-10-CM | POA: Diagnosis present

## 2022-05-10 DIAGNOSIS — I493 Ventricular premature depolarization: Secondary | ICD-10-CM | POA: Diagnosis present

## 2022-05-10 DIAGNOSIS — I5021 Acute systolic (congestive) heart failure: Secondary | ICD-10-CM | POA: Diagnosis present

## 2022-05-10 DIAGNOSIS — I361 Nonrheumatic tricuspid (valve) insufficiency: Secondary | ICD-10-CM | POA: Diagnosis not present

## 2022-05-10 DIAGNOSIS — Z79899 Other long term (current) drug therapy: Secondary | ICD-10-CM | POA: Diagnosis not present

## 2022-05-10 DIAGNOSIS — R0602 Shortness of breath: Secondary | ICD-10-CM | POA: Diagnosis present

## 2022-05-10 DIAGNOSIS — I2510 Atherosclerotic heart disease of native coronary artery without angina pectoris: Secondary | ICD-10-CM | POA: Diagnosis present

## 2022-05-10 DIAGNOSIS — E876 Hypokalemia: Secondary | ICD-10-CM | POA: Diagnosis present

## 2022-05-10 DIAGNOSIS — E669 Obesity, unspecified: Secondary | ICD-10-CM | POA: Diagnosis present

## 2022-05-10 DIAGNOSIS — M5431 Sciatica, right side: Secondary | ICD-10-CM | POA: Diagnosis present

## 2022-05-10 DIAGNOSIS — I340 Nonrheumatic mitral (valve) insufficiency: Secondary | ICD-10-CM | POA: Diagnosis not present

## 2022-05-10 DIAGNOSIS — I509 Heart failure, unspecified: Secondary | ICD-10-CM | POA: Diagnosis not present

## 2022-05-10 DIAGNOSIS — Z6837 Body mass index (BMI) 37.0-37.9, adult: Secondary | ICD-10-CM | POA: Diagnosis not present

## 2022-05-10 DIAGNOSIS — I351 Nonrheumatic aortic (valve) insufficiency: Secondary | ICD-10-CM | POA: Diagnosis not present

## 2022-05-10 DIAGNOSIS — I110 Hypertensive heart disease with heart failure: Secondary | ICD-10-CM | POA: Diagnosis present

## 2022-05-10 DIAGNOSIS — Z20822 Contact with and (suspected) exposure to covid-19: Secondary | ICD-10-CM | POA: Diagnosis present

## 2022-05-10 DIAGNOSIS — Z96643 Presence of artificial hip joint, bilateral: Secondary | ICD-10-CM | POA: Diagnosis present

## 2022-05-10 DIAGNOSIS — I10 Essential (primary) hypertension: Secondary | ICD-10-CM | POA: Diagnosis not present

## 2022-05-10 DIAGNOSIS — I472 Ventricular tachycardia, unspecified: Secondary | ICD-10-CM | POA: Diagnosis present

## 2022-05-10 DIAGNOSIS — F17210 Nicotine dependence, cigarettes, uncomplicated: Secondary | ICD-10-CM | POA: Diagnosis present

## 2022-05-10 DIAGNOSIS — I428 Other cardiomyopathies: Secondary | ICD-10-CM | POA: Diagnosis present

## 2022-05-10 DIAGNOSIS — I3139 Other pericardial effusion (noninflammatory): Secondary | ICD-10-CM | POA: Diagnosis present

## 2022-05-10 LAB — BASIC METABOLIC PANEL
Anion gap: 7 (ref 5–15)
BUN: 14 mg/dL (ref 6–20)
CO2: 25 mmol/L (ref 22–32)
Calcium: 8.9 mg/dL (ref 8.9–10.3)
Chloride: 101 mmol/L (ref 98–111)
Creatinine, Ser: 1.2 mg/dL (ref 0.61–1.24)
GFR, Estimated: >60 mL/min (ref >60)
Glucose, Bld: 90 mg/dL (ref 70–99)
Potassium: 3.7 mmol/L (ref 3.5–5.1)
Sodium: 133 mmol/L — ABNORMAL LOW (ref 135–145)

## 2022-05-10 LAB — ECHOCARDIOGRAM COMPLETE
Area-P 1/2: 4.49 cm2
Calc EF: 36 %
S' Lateral: 5.1 cm
Single Plane A2C EF: 31.5 %
Single Plane A4C EF: 35.9 %

## 2022-05-10 LAB — HIV ANTIBODY (ROUTINE TESTING W REFLEX): HIV Screen 4th Generation wRfx: NONREACTIVE

## 2022-05-10 LAB — LIPID PANEL
Cholesterol: 230 mg/dL — ABNORMAL HIGH (ref 0–200)
HDL: 35 mg/dL — ABNORMAL LOW (ref >40)
LDL Cholesterol: 153 mg/dL — ABNORMAL HIGH (ref 0–99)
Total CHOL/HDL Ratio: 6.6 RATIO
Triglycerides: 208 mg/dL — ABNORMAL HIGH (ref <150)
VLDL: 42 mg/dL — ABNORMAL HIGH (ref 0–40)

## 2022-05-10 LAB — TSH: TSH: 2.61 u[IU]/mL (ref 0.350–4.500)

## 2022-05-10 LAB — MRSA NEXT GEN BY PCR, NASAL: MRSA by PCR Next Gen: NOT DETECTED

## 2022-05-10 MED ORDER — ATORVASTATIN CALCIUM 10 MG PO TABS
20.0000 mg | ORAL_TABLET | Freq: Every day | ORAL | Status: DC
  Administered 2022-05-10 – 2022-05-11 (×2): 20 mg via ORAL
  Filled 2022-05-10 (×2): qty 2

## 2022-05-10 MED ORDER — ONDANSETRON HCL 4 MG/2ML IJ SOLN: 4.0000 mg | Freq: Four times a day (QID) | INTRAMUSCULAR | Status: DC | PRN

## 2022-05-10 MED ORDER — ACETAMINOPHEN 325 MG PO TABS: 650.0000 mg | ORAL_TABLET | ORAL | Status: DC | PRN

## 2022-05-10 MED ORDER — SODIUM CHLORIDE 0.9 % IV SOLN: 250.0000 mL | INTRAVENOUS | Status: DC | PRN

## 2022-05-10 MED ORDER — FUROSEMIDE 10 MG/ML IJ SOLN
40.0000 mg | Freq: Every day | INTRAMUSCULAR | Status: DC
  Administered 2022-05-10: 40 mg via INTRAVENOUS
  Filled 2022-05-10: qty 4

## 2022-05-10 MED ORDER — FUROSEMIDE 10 MG/ML IJ SOLN
40.0000 mg | Freq: Two times a day (BID) | INTRAMUSCULAR | Status: DC
Start: 2022-05-10 — End: 2022-05-11
  Administered 2022-05-10 – 2022-05-11 (×2): 40 mg via INTRAVENOUS
  Filled 2022-05-10 (×2): qty 4

## 2022-05-10 MED ORDER — PERFLUTREN LIPID MICROSPHERE
1.0000 mL | INTRAVENOUS | Status: AC | PRN
  Administered 2022-05-10: 2.5 mL via INTRAVENOUS

## 2022-05-10 MED ORDER — ALZAIR ALLERGY NASAL SPRAY NA POWD
Freq: Every day | NASAL | Status: DC
Start: 2022-05-10 — End: 2022-05-10

## 2022-05-10 MED ORDER — SODIUM CHLORIDE 0.9% FLUSH: 3.0000 mL | INTRAVENOUS | Status: DC | PRN

## 2022-05-10 MED ORDER — ENOXAPARIN SODIUM 40 MG/0.4ML IJ SOSY
40.0000 mg | PREFILLED_SYRINGE | INTRAMUSCULAR | Status: DC
  Administered 2022-05-10 – 2022-05-11 (×2): 40 mg via SUBCUTANEOUS
  Filled 2022-05-10 (×2): qty 0.4

## 2022-05-10 MED ORDER — SODIUM CHLORIDE 0.9% FLUSH
3.0000 mL | Freq: Two times a day (BID) | INTRAVENOUS | Status: DC
  Administered 2022-05-10 – 2022-05-11 (×4): 3 mL via INTRAVENOUS

## 2022-05-10 MED ORDER — DAPAGLIFLOZIN PROPANEDIOL 10 MG PO TABS
10.0000 mg | ORAL_TABLET | Freq: Every day | ORAL | Status: DC
Start: 2022-05-10 — End: 2022-05-13
  Administered 2022-05-10 – 2022-05-13 (×4): 10 mg via ORAL
  Filled 2022-05-10 (×4): qty 1

## 2022-05-10 MED ORDER — FLUTICASONE PROPIONATE 50 MCG/ACT NA SUSP
1.0000 | Freq: Every day | NASAL | Status: DC
  Administered 2022-05-10 – 2022-05-12 (×3): 1 via NASAL
  Filled 2022-05-10: qty 16

## 2022-05-10 NOTE — Progress Notes (Signed)
?  Progress Note ? ? ?Patient: Alan Coffey [id number] DOB: Aug 09, 1966 DOA: 05/09/2022     0 ?DOS: the patient was seen and examined on 05/10/2022 ?  ?Brief hospital course: ?Alan Coffey was admitted to the hospital with the working diagnosis of decompensated heart failure ? ?56 yo male with past medical history of hypertension and dyslipidemia who presented with dyspnea. Worsening symptoms over last 24 hr before admission, associated with lower extremity edema and non productive cough. Positive orthopnea. On his initial physical examination his blood pressure was 156/86, HR 76, RR 24, 02 saturation 96%, lungs with decreased breath sounds, heart with S1 and S2 present and rhythmic, abdomen not distended and trace lower extremity edema. ? ?Chest radiograph with cardiomegaly and bilateral hilar vascular congestion, bilateral interstitial infiltrates, with cephalization of the vasculature.  ? ?EKG 100 bpm, normal axis, normal intervals, sinus rhythm, with poor R wave progression, no significant ST segment changes, negative T wave v5 and v6 with positive LVH.  ? ? ? ?Assessment and Plan: ?* New onset of congestive heart failure ([place]) ?Patient with improvement in his symptoms ? ?Urine output documented 2,500 ml  ?Blood pressure systolic [id number] to [id number] mmHg. ? ?Plan to continue diuresis with furosemide ?Follow up on echocardiogram.  ?Add dapagliflozin  ? ?Out of bed to chair tid with meals ? ?HTN (hypertension) ?Continue blood pressure monitoring ?Aggressive diuresis with furosemide. ? ?Likely will add ARB at the time of discharge, continue to hold on amlodipine.  ? ?HLD (hyperlipidemia) ?Elevated LDL 163. ? ?Plan to add statin therapy.  ? ?Class 2 obesity ?Calculated BMI is 37,1 ? ? ? ? ?  ? ?Subjective: Patient is feeling better but not back to baseline, no chest pain  ? ?Physical Exam: ?Vitals:  ? 05/10/22 1355 05/10/22 1356 05/10/22 1445 05/10/22 1451  ?BP: 132/83   (!) 131/95  ?Pulse: 83   91  ?Resp: 17   17   ?Temp:  97.6 ?F (36.4 ?C) 98.3 ?F (36.8 ?C) 98.3 ?F (36.8 ?C)  ?TempSrc:  Oral  Oral  ?SpO2: 94%     ?Weight:    110.8 kg  ?Height:    5\' 8"  (1.727 m)  ? ?Neurology awake and alert ?ENT with no pallor ?Cardiovascular with S1 and S2 present and rhythmic ?Respiratory with scattered bilatearl rales or wheezing ?Abdomen soft ?Trace lower extremity edema  ?Data Reviewed: ? ? ? ?Family Communication: I spoke with patient's family at the bedside, we talked in detail about patient's condition, plan of care and prognosis and all questions were addressed. ? ? ?Disposition: ?Status is: Inpatient ?Remains inpatient appropriate because: heart failure  ? Planned Discharge Destination: Home ? ? ? ?Author: ?Tawni Millers, MD ?05/10/2022 5:21 PM ? ?For on call review [email or web address].  ?

## 2022-05-10 NOTE — ED Notes (Signed)
Breakfast order placed ?

## 2022-05-10 NOTE — Assessment & Plan Note (Addendum)
Elevated LDL 163.  Continue statin therapy.

## 2022-05-10 NOTE — Assessment & Plan Note (Addendum)
Patient was admitted to the cardiac unit and placed on aggressive diuresis with IV furosemide, negative fluid balanced was achieved, -4,012 ml, with significant improvement in his symptoms.    Echocardiogram with reduced LV systolic function to 25 to 30%, internal cavity with mild dilatation. Mild LVH. Small pericardial effusion. No significant valvular disease.   Cardiac catheterization with PA 20/12 with mean 5, PCW 5, cardiac output 4.1 and index 1.9 per [facility].  Mid Cx lesion 30% stenosed Mid LAD lesion 20% stenosed.   Cardiac MRI with reported with LGE septal and occasional epicardial without RV free wall involvement and without being truly multi-focal, low threshold for PET scan evaluation.    Medical therapy with empagliflozin, entresto, spironolactone and carvedilol  Diuresis with furosemide.   Positive PVC and non sustained ventricular tachycardia, started on mexiletine with good toleration.   For mild coronary artery disease on aspirin and statin therapy.

## 2022-05-10 NOTE — Hospital Course (Addendum)
Mr. Alan Coffey was admitted to the hospital with the working diagnosis of decompensated heart failure  56 yo male with past medical history of hypertension and dyslipidemia who presented with dyspnea. Worsening symptoms over last 24 hr before admission, associated with lower extremity edema and non productive cough. Positive orthopnea. On his initial physical examination his blood pressure was 156/86, HR 76, RR 24, 02 saturation 96%, lungs with decreased breath sounds, heart with S1 and S2 present and rhythmic, abdomen not distended and trace lower extremity edema.  Chest radiograph with cardiomegaly and bilateral hilar vascular congestion, bilateral interstitial infiltrates, with cephalization of the vasculature.   EKG 100 bpm, normal axis, normal intervals, sinus rhythm, with poor R wave progression, no significant ST segment changes, negative T wave v5 and v6 with positive LVH.   Patient was placed on furosemide for diuresis.  Echocardiogram with reduced LV systolic function.  Cardiac catheterization with no significant coronary artery disease.  Positive Non sustained VT on telemetry monitoring. Patient has been started on carvedilol and mexiletine with good toleration.   Cardiac MRI with LGE septal and occasional epicardial without RV free wall involvement and without multi focal. Low threshold for PET evaluation.

## 2022-05-10 NOTE — Assessment & Plan Note (Addendum)
Continue blood pressure control with carvedilol and entresto Diuresis with spironolactone and furosemide.

## 2022-05-10 NOTE — Assessment & Plan Note (Signed)
Calculated BMI is 37,1 ?

## 2022-05-10 NOTE — H&P (Signed)
?History and Physical  ? ? ?Patient: Alan Coffey XIP:[id number] DOB: 04/28/1966 ?DOA: 05/09/2022 ?DOS: the patient was seen and examined on 05/10/2022 ?PCP: Sandi Mariscal, MD  ?Patient coming from: Home ? ?Chief Complaint:  ?Chief Complaint  ?Patient presents with  ? Shortness of Breath  ? ?HPI: Alan Coffey is a 56 y.o. male with medical history significant of HTN, HLD.  Pt presents to ED with c/o dyspnea that has been worsening over the past week, especially past 24h. ? ?Worse with laying down flat or exertion. ? ?Not on home O2. ? ?Doesn't see cardiology regularly. ? ?Had mild chest tightness and discomfort last night and this AM, but none now. ? ?Mildly increased BLE edema. ? ?Has nonproductive cough over past few days. ? ? ?Review of Systems: As mentioned in the history of present illness. All other systems reviewed and are negative. ?Past Medical History:  ?Diagnosis Date  ? Arthritis   ? Dry skin   ? on chest using goldbond lotion on  ? Hypertension   ? Sciatic nerve pain right comes and goes  ? ?Past Surgical History:  ?Procedure Laterality Date  ? cyst removed left shoulder     ? MASS EXCISION Left 06/11/2020  ? Procedure: REMOVAL OF LEFT SHOULDER SUBCUTANEOUS MASS;  Surgeon: Michael Boston, MD;  Location: [facility];  Service: General;  Laterality: Left;  MAC VS GENERAL (ANESTHESIA CHOICE)  ? TOTAL HIP ARTHROPLASTY Left 07/01/2021  ? Procedure: LEFT TOTAL HIP ARTHROPLASTY ANTERIOR APPROACH;  Surgeon: Mcarthur Rossetti, MD;  Location: [facility];  Service: Orthopedics;  Laterality: Left;  ? TOTAL HIP ARTHROPLASTY Right 10/14/2021  ? Procedure: RIGHT TOTAL HIP ARTHROPLASTY ANTERIOR APPROACH;  Surgeon: Mcarthur Rossetti, MD;  Location: [facility];  Service: Orthopedics;  Laterality: Right;  ? ?Social History:  reports that he has been smoking cigarettes. He has a 18.50 pack-year smoking history. He has never used smokeless tobacco. He reports that he does not drink alcohol and does not  use drugs. ? ?No Known Allergies ? ?History reviewed. No pertinent family history. ? ?Prior to Admission medications   ?Medication Sig Start Date End Date Taking? Authorizing Provider  ?acetaminophen (TYLENOL) 325 MG tablet Take 325 mg by mouth every 6 (six) hours as needed for moderate pain or headache.   Yes [provider]  ?amLODipine (NORVASC) 5 MG tablet Take 5 mg by mouth daily. 06/28/21  Yes [provider]  ?diphenhydrAMINE (BENADRYL) 25 MG tablet Take 25 mg by mouth every 6 (six) hours as needed for allergies.   Yes [provider]  ?Hypromellose Vertell Limber ALLERGY NASAL SPRAY NA) Place 1 spray into both nostrils at bedtime.   Yes [provider]  ? ? ?Physical Exam: ?Vitals:  ? 05/09/22 2230 05/09/22 2245 05/09/22 2345 05/10/22 0030  ?BP:  (!) 156/86 (!) 130/104 119/83  ?Pulse: 86  76 70  ?Resp: 16 (!) _0 ?Temp:      ?TempSrc:      ?SpO2: 96% 99% 94% 93%  ? ?Constitutional: NAD, calm, comfortable ?Eyes: PERRL, lids and conjunctivae normal ?ENMT: Mucous membranes are moist. Posterior pharynx clear of any exudate or lesions.Normal dentition.  ?Neck: normal, supple, no masses, no thyromegaly ?Respiratory: Decreased BS ?Cardiovascular: Regular rate and rhythm, no murmurs / rubs / gallops. Trace BLE extremity edema. 2+ pedal pulses. No carotid bruits.  ?Abdomen: no tenderness, no masses palpated. No hepatosplenomegaly. Bowel sounds positive.  ?Musculoskeletal: no clubbing / cyanosis. No joint deformity upper  and lower extremities. Good ROM, no contractures. Normal muscle tone.  ?Skin: no rashes, lesions, ulcers. No induration ?Neurologic: CN 2-12 grossly intact. Sensation intact, DTR normal. Strength 5/5 in all 4.  ?Psychiatric: Normal judgment and insight. Alert and oriented x 3. Normal mood.  ? ?Data Reviewed: ? ?Covid and flu neg ? ?CXR showing Mild perihilar and bibasilar interstitial edema or infiltrates, new since previous. ? ?BNP 772. ? ?WBC nl ? ?Trops: 52, 72,  60 ? ?Assessment and Plan: ?* New onset of congestive heart failure ([place]) ?Pt with likely new onset CHF. ?CHF pathway ?Cards consulted ?Lasix 49m IV x1 now then 427mIV daily for the moment ?2d echo ordered ?Tele monitor ?Slight trop elevation but serial trops are flat. ?EKG does show some TWI in inferior + lateral leads ?Got ASA in ED ?Will hold off on starting heparin gtt unless CP returns. ?Check TSH ? ?HLD (hyperlipidemia) ?Check lipid pnl ? ?HTN (hypertension) ?Got his home amlodipine in ED, SBP currently 119 ?Hold amlodipine ?If BP rises, may want to try and get him on ACE/ARB instead ? ? ? ? ? Advance Care Planning:   Code Status: Full Code ? ?Consults: Cards ? ?Family Communication: No family in room ? ?Severity of Illness: ?The appropriate patient status for this patient is OBSERVATION. Observation status is judged to be reasonable and necessary in order to provide the required intensity of service to ensure the patient's safety. The patient's presenting symptoms, physical exam findings, and initial radiographic and laboratory data in the context of their medical condition is felt to place them at decreased risk for further clinical deterioration. Furthermore, it is anticipated that the patient will be medically stable for discharge from the hospital within 2 midnights of admission.  ? ?Author: ?GAEtta Quill DO ?05/10/2022 1:06 AM ? ?For on call review [email or web address] ?

## 2022-05-10 NOTE — Progress Notes (Signed)
RN notified of pt having two separate runs of VT; one 5 beat run and one six beat run. MD paged; ?

## 2022-05-10 NOTE — Progress Notes (Signed)
?  Echocardiogram ?2D Echocardiogram has been performed. ? ?Alan Coffey ?05/10/2022, 10:52 AM ?

## 2022-05-11 ENCOUNTER — Encounter (HOSPITAL_COMMUNITY)
Admission: EM | Disposition: A | Discharge status: Home / Self Care | Payer: Self-pay | Source: Home / Self Care | Attending: Internal Medicine

## 2022-05-11 ENCOUNTER — Encounter (HOSPITAL_COMMUNITY): Payer: Self-pay | Admitting: Internal Medicine

## 2022-05-11 ENCOUNTER — Other Ambulatory Visit (HOSPITAL_COMMUNITY): Payer: Self-pay

## 2022-05-11 DIAGNOSIS — I10 Essential (primary) hypertension: Secondary | ICD-10-CM | POA: Diagnosis not present

## 2022-05-11 DIAGNOSIS — E669 Obesity, unspecified: Secondary | ICD-10-CM | POA: Diagnosis not present

## 2022-05-11 DIAGNOSIS — I2510 Atherosclerotic heart disease of native coronary artery without angina pectoris: Secondary | ICD-10-CM

## 2022-05-11 DIAGNOSIS — E785 Hyperlipidemia, unspecified: Secondary | ICD-10-CM | POA: Diagnosis not present

## 2022-05-11 DIAGNOSIS — I5021 Acute systolic (congestive) heart failure: Secondary | ICD-10-CM | POA: Diagnosis not present

## 2022-05-11 HISTORY — PX: RIGHT/LEFT HEART CATH AND CORONARY ANGIOGRAPHY: CATH118266

## 2022-05-11 LAB — POCT I-STAT EG7
Acid-Base Excess: 2 mmol/L (ref 0.0–2.0)
Bicarbonate: 26.1 mmol/L (ref 20.0–28.0)
Calcium, Ion: 1.18 mmol/L (ref 1.15–1.40)
HCT: 44 % (ref 39.0–52.0)
Hemoglobin: 15 g/dL (ref 13.0–17.0)
O2 Saturation: 62 %
Potassium: 3 mmol/L — ABNORMAL LOW (ref 3.5–5.1)
Sodium: 141 mmol/L (ref 135–145)
TCO2: 27 mmol/L (ref 22–32)
pCO2, Ven: 39.6 mmHg — ABNORMAL LOW (ref 44–60)
pH, Ven: 7.428 (ref 7.25–7.43)
pO2, Ven: 31 mmHg — CL (ref 32–45)

## 2022-05-11 LAB — CARDIAC CATHETERIZATION: Cath EF Quantitative: 25 %

## 2022-05-11 LAB — CBC WITH DIFFERENTIAL/PLATELET
Abs Immature Granulocytes: 0.04 10*3/uL (ref 0.00–0.07)
Basophils Absolute: 0 10*3/uL (ref 0.0–0.1)
Basophils Relative: 0 %
Eosinophils Absolute: 0.2 10*3/uL (ref 0.0–0.5)
Eosinophils Relative: 2 %
HCT: 48.4 % (ref 39.0–52.0)
Hemoglobin: 15.5 g/dL (ref 13.0–17.0)
Immature Granulocytes: 0 %
Lymphocytes Relative: 21 %
Lymphs Abs: 2.2 10*3/uL (ref 0.7–4.0)
MCH: 26.5 pg (ref 26.0–34.0)
MCHC: 32 g/dL (ref 30.0–36.0)
MCV: 82.6 fL (ref 80.0–100.0)
Monocytes Absolute: 0.7 10*3/uL (ref 0.1–1.0)
Monocytes Relative: 6 %
Neutro Abs: 7.4 10*3/uL (ref 1.7–7.7)
Neutrophils Relative %: 71 %
Platelets: 212 10*3/uL (ref 150–400)
RBC: 5.86 MIL/uL — ABNORMAL HIGH (ref 4.22–5.81)
RDW: 15.5 % (ref 11.5–15.5)
WBC: 10.5 10*3/uL (ref 4.0–10.5)
nRBC: 0 % (ref 0.0–0.2)

## 2022-05-11 LAB — BASIC METABOLIC PANEL
Anion gap: 9 (ref 5–15)
BUN: 16 mg/dL (ref 6–20)
CO2: 26 mmol/L (ref 22–32)
Calcium: 9.1 mg/dL (ref 8.9–10.3)
Chloride: 101 mmol/L (ref 98–111)
Creatinine, Ser: 1.24 mg/dL (ref 0.61–1.24)
GFR, Estimated: >60 mL/min (ref >60)
Glucose, Bld: 117 mg/dL — ABNORMAL HIGH (ref 70–99)
Potassium: 3.9 mmol/L (ref 3.5–5.1)
Sodium: 136 mmol/L (ref 135–145)

## 2022-05-11 LAB — MAGNESIUM: Magnesium: 2 mg/dL (ref 1.7–2.4)

## 2022-05-11 SURGERY — RIGHT/LEFT HEART CATH AND CORONARY ANGIOGRAPHY
Anesthesia: LOCAL

## 2022-05-11 MED ORDER — SODIUM CHLORIDE 0.9% FLUSH
3.0000 mL | Freq: Two times a day (BID) | INTRAVENOUS | Status: DC
  Administered 2022-05-11: 3 mL via INTRAVENOUS

## 2022-05-11 MED ORDER — SODIUM CHLORIDE 0.9 % IV SOLN
INTRAVENOUS | Status: DC
Start: 2022-05-11 — End: 2022-05-11
  Administered 2022-05-11: 500 mL via INTRAVENOUS

## 2022-05-11 MED ORDER — ASPIRIN 81 MG PO CHEW: 81.0000 mg | CHEWABLE_TABLET | ORAL | Status: DC

## 2022-05-11 MED ORDER — SODIUM CHLORIDE 0.9 % IV SOLN: INTRAVENOUS | Status: AC

## 2022-05-11 MED ORDER — HEPARIN (PORCINE) IN NACL 2-0.9 UNITS/ML
INTRAMUSCULAR | Status: DC | PRN
  Administered 2022-05-11: 10 mL via INTRA_ARTERIAL

## 2022-05-11 MED ORDER — SPIRONOLACTONE 12.5 MG HALF TABLET
12.5000 mg | ORAL_TABLET | Freq: Every day | ORAL | Status: DC
  Administered 2022-05-11 – 2022-05-12 (×2): 12.5 mg via ORAL
  Filled 2022-05-11 (×2): qty 1

## 2022-05-11 MED ORDER — LABETALOL HCL 5 MG/ML IV SOLN: 10.0000 mg | INTRAVENOUS | Status: AC | PRN

## 2022-05-11 MED ORDER — SACUBITRIL-VALSARTAN 49-51 MG PO TABS
1.0000 | ORAL_TABLET | Freq: Two times a day (BID) | ORAL | Status: DC
  Administered 2022-05-11 – 2022-05-13 (×5): 1 via ORAL
  Filled 2022-05-11 (×6): qty 1

## 2022-05-11 MED ORDER — CARVEDILOL 3.125 MG PO TABS
3.1250 mg | ORAL_TABLET | Freq: Two times a day (BID) | ORAL | Status: DC
  Administered 2022-05-11 – 2022-05-13 (×4): 3.125 mg via ORAL
  Filled 2022-05-11 (×4): qty 1

## 2022-05-11 MED ORDER — SODIUM CHLORIDE 0.9 % IV SOLN: 250.0000 mL | INTRAVENOUS | Status: DC | PRN

## 2022-05-11 MED ORDER — MEXILETINE HCL 200 MG PO CAPS
200.0000 mg | ORAL_CAPSULE | Freq: Two times a day (BID) | ORAL | Status: DC
  Administered 2022-05-11 – 2022-05-12 (×2): 200 mg via ORAL
  Filled 2022-05-11 (×2): qty 1

## 2022-05-11 MED ORDER — FENTANYL CITRATE (PF) 100 MCG/2ML IJ SOLN
INTRAMUSCULAR | Status: DC | PRN
Start: 2022-05-11 — End: 2022-05-11
  Administered 2022-05-11: 25 ug via INTRAVENOUS

## 2022-05-11 MED ORDER — IOHEXOL 350 MG/ML SOLN
INTRAVENOUS | Status: DC | PRN
  Administered 2022-05-11: 45 mL via INTRA_ARTERIAL

## 2022-05-11 MED ORDER — HYDRALAZINE HCL 20 MG/ML IJ SOLN: 10.0000 mg | INTRAMUSCULAR | Status: AC | PRN

## 2022-05-11 MED ORDER — LIDOCAINE HCL (PF) 1 % IJ SOLN
INTRAMUSCULAR | Status: DC | PRN
  Administered 2022-05-11 (×2): 2 mL

## 2022-05-11 MED ORDER — LIDOCAINE HCL (PF) 1 % IJ SOLN
INTRAMUSCULAR | Status: AC
  Filled 2022-05-11: qty 30

## 2022-05-11 MED ORDER — ATORVASTATIN CALCIUM 40 MG PO TABS
40.0000 mg | ORAL_TABLET | Freq: Every day | ORAL | Status: DC
  Administered 2022-05-12 – 2022-05-13 (×2): 40 mg via ORAL
  Filled 2022-05-11 (×2): qty 1

## 2022-05-11 MED ORDER — SODIUM CHLORIDE 0.9% FLUSH: 3.0000 mL | INTRAVENOUS | Status: DC | PRN

## 2022-05-11 MED ORDER — SODIUM CHLORIDE 0.9 % IV SOLN: INTRAVENOUS | Status: DC

## 2022-05-11 MED ORDER — MIDAZOLAM HCL 2 MG/2ML IJ SOLN
INTRAMUSCULAR | Status: AC
  Filled 2022-05-11: qty 2

## 2022-05-11 MED ORDER — CARVEDILOL 6.25 MG PO TABS: 6.2500 mg | ORAL_TABLET | Freq: Two times a day (BID) | ORAL | Status: DC

## 2022-05-11 MED ORDER — HEPARIN (PORCINE) IN NACL 1000-0.9 UT/500ML-% IV SOLN
INTRAVENOUS | Status: AC
  Filled 2022-05-11: qty 1000

## 2022-05-11 MED ORDER — HEPARIN (PORCINE) IN NACL 1000-0.9 UT/500ML-% IV SOLN
INTRAVENOUS | Status: DC | PRN
  Administered 2022-05-11 (×2): 500 mL

## 2022-05-11 MED ORDER — FENTANYL CITRATE (PF) 100 MCG/2ML IJ SOLN
INTRAMUSCULAR | Status: AC
  Filled 2022-05-11: qty 2

## 2022-05-11 MED ORDER — VERAPAMIL HCL 2.5 MG/ML IV SOLN
INTRAVENOUS | Status: AC
  Filled 2022-05-11: qty 2

## 2022-05-11 MED ORDER — ENOXAPARIN SODIUM 40 MG/0.4ML IJ SOSY
40.0000 mg | PREFILLED_SYRINGE | INTRAMUSCULAR | Status: DC
  Filled 2022-05-11 (×2): qty 0.4

## 2022-05-11 MED ORDER — MIDAZOLAM HCL 2 MG/2ML IJ SOLN
INTRAMUSCULAR | Status: DC | PRN
  Administered 2022-05-11: 2 mg via INTRAVENOUS

## 2022-05-11 MED ORDER — HEPARIN SODIUM (PORCINE) 1000 UNIT/ML IJ SOLN
INTRAMUSCULAR | Status: AC
  Filled 2022-05-11: qty 10

## 2022-05-11 MED ORDER — HEPARIN SODIUM (PORCINE) 1000 UNIT/ML IJ SOLN
INTRAMUSCULAR | Status: DC | PRN
  Administered 2022-05-11: 5000 [IU] via INTRAVENOUS

## 2022-05-11 MED ORDER — ACETAMINOPHEN 325 MG PO TABS: 650.0000 mg | ORAL_TABLET | ORAL | Status: DC | PRN

## 2022-05-11 MED ORDER — ONDANSETRON HCL 4 MG/2ML IJ SOLN: 4.0000 mg | Freq: Four times a day (QID) | INTRAMUSCULAR | Status: DC | PRN

## 2022-05-11 MED ORDER — FUROSEMIDE 40 MG PO TABS
40.0000 mg | ORAL_TABLET | Freq: Every day | ORAL | Status: DC
  Administered 2022-05-12: 40 mg via ORAL
  Filled 2022-05-11: qty 1

## 2022-05-11 MED ORDER — SODIUM CHLORIDE 0.9% FLUSH
3.0000 mL | Freq: Two times a day (BID) | INTRAVENOUS | Status: DC
  Administered 2022-05-12 – 2022-05-13 (×3): 3 mL via INTRAVENOUS

## 2022-05-11 SURGICAL SUPPLY — 10 items
BAND ZEPHYR COMPRESS 30 LONG (HEMOSTASIS) ×1 IMPLANT
CATH 5FR JL3.5 JR4 ANG PIG MP (CATHETERS) ×1 IMPLANT
CATH BALLN WEDGE 5F 110CM (CATHETERS) ×1 IMPLANT
GLIDESHEATH SLEND SS 6F .021 (SHEATH) ×2 IMPLANT
GUIDEWIRE .025 260CM (WIRE) ×1 IMPLANT
GUIDEWIRE INQWIRE 1.5J.035X260 (WIRE) IMPLANT
INQWIRE 1.5J .035X260CM (WIRE) ×2
PACK CARDIAC CATHETERIZATION (CUSTOM PROCEDURE TRAY) ×2 IMPLANT
SHEATH GLIDE SLENDER 4/5FR (SHEATH) ×1 IMPLANT
TRANSDUCER W/STOPCOCK (MISCELLANEOUS) ×3 IMPLANT

## 2022-05-11 NOTE — TOC Initial Note (Signed)
Transition of Care [facility]) - Initial/Assessment Note    Patient Details  Name: Alan Coffey MRN: [id number] Date of Birth: 1966-08-06  Transition [facility]) CM/SW Contact:    Beckie Busing, RN Phone Number:[phone]  05/11/2022, 11:56 AM  Clinical Narrative:                 .  TOC consult entered for heart failure screen. Per patient he lives at home alone independently with no DME or home health. Patient states that he has never been told that he has heart failure. Patient has PCP Dr. Wynelle Link here in [place] but does not have a Cardiologist. Patient states that he is active with Dr. Wynelle Link. Preferred pharmacy is [facility] on corner of [address] and [address]. Patient reports that his medications are affordable and that he does have transportation to all appointments.  Transition of Care Department [facility]) has reviewed patient and no TOC needs have been identified at this time. We will continue to monitor patient advancement through interdisciplinary progression.   Expected Discharge Plan: Home/Self Care Barriers to Discharge: Continued Medical Work up   Patient Goals and CMS Choice Patient states their goals for this hospitalization and ongoing recovery are:: Wants to get better to go home CMS Medicare.gov Compare Post Acute Care list provided to::  (n/a)    Expected Discharge Plan and Services Expected Discharge Plan: Home/Self Care In-house Referral: NA Discharge Planning Services: CM Consult Post Acute Care Choice: NA Living arrangements for the past 2 months: Single Family Home                 DME Arranged: N/A DME Agency: NA       HH Arranged: NA HH Agency: NA        Prior Living Arrangements/Services Living arrangements for the past 2 months: Single Family Home Lives with:: Self Patient language and need for interpreter reviewed:: Yes Do you feel safe going back to the place where you live?: Yes      Need for Family Participation in Patient Care: No  (Comment) Care giver support system in place?: Yes (comment) Current home services:  (n/a) Criminal Activity/Legal Involvement Pertinent to Current Situation/Hospitalization: No - Comment as needed  Activities of Daily Living Home Assistive Devices/Equipment: None ADL Screening (condition at time of admission) Patient's cognitive ability adequate to safely complete daily activities?: Yes Is the patient deaf or have difficulty hearing?: No Does the patient have difficulty seeing, even when wearing glasses/contacts?: No Does the patient have difficulty concentrating, remembering, or making decisions?: No Patient able to express need for assistance with ADLs?: Yes Does the patient have difficulty dressing or bathing?: No Independently performs ADLs?: Yes (appropriate for developmental age) Does the patient have difficulty walking or climbing stairs?: No Weakness of Legs: None Weakness of Arms/Hands: None  Permission Sought/Granted Permission sought to share information with : Family Supports Permission granted to share information with : No              Emotional Assessment Appearance:: Appears stated age Attitude/Demeanor/Rapport: Gracious Affect (typically observed): Pleasant Orientation: : Oriented to Self, Oriented to Place, Oriented to  Time, Oriented to Situation Alcohol / Substance Use: Not Applicable Psych Involvement: No (comment)  Admission diagnosis:  NSTEMI (non-ST elevated myocardial infarction) (HCC) [I21.4] Heart failure (HCC) [I50.9] New onset of congestive heart failure (HCC) [I50.9] Acute congestive heart failure, unspecified heart failure type (HCC) [I50.9] Patient Active Problem List   Diagnosis Date Noted   Acute clinical  systolic heart failure (HCC) 05/10/2022   HTN (hypertension) 05/10/2022   HLD (hyperlipidemia) 05/10/2022   Class 2 obesity 05/10/2022   Status post total replacement of right hip 10/14/2021   Status post left hip replacement  07/01/2021   Unilateral primary osteoarthritis, left hip 05/19/2021   Unilateral primary osteoarthritis, right hip 05/19/2021   Mass of soft tissue of left upper extremity 03/30/2020   PCP:  [name], MD Pharmacy:   [facility] [phone] Ginette Otto, [place] - [address] AT [facility] OF [address] & [facility] [address] [place] [address] Phone: [phone] Fax: [phone]     Social Determinants of Health (SDOH) Interventions    Readmission Risk Interventions     View : No data to display.

## 2022-05-11 NOTE — Interval H&P Note (Signed)
History and Physical Interval Note:  05/11/2022 2:18 PM  Alan Coffey  has presented today for surgery, with the diagnosis of heart failure.  The various methods of treatment have been discussed with the patient and family. After consideration of risks, benefits and other options for treatment, the patient has consented to  Procedure(s): RIGHT/LEFT HEART CATH AND CORONARY ANGIOGRAPHY (N/A) and possible coronary angioplasty as a surgical intervention.  The patient's history has been reviewed, patient examined, no change in status, stable for surgery.  I have reviewed the patient's chart and labs.  Questions were answered to the patient's satisfaction.     [name]

## 2022-05-11 NOTE — Consult Note (Addendum)
Advanced Heart Failure Team Consult Note   Primary Physician: Sandi Mariscal, MD PCP-Cardiologist:  None  Reason for Consultation: Acute Systolic Heart Failure   HPI:    Alan Coffey is seen today for evaluation of acute heart failure  at the request of Dr Cathlean Sauer  Alan Coffey is a 56 year old with h/o OA, S/P R and L hip replacement 2022, HTN and HLD.  Had COVID earlier this year.  Father had heart failure. Brother had MI.   Over the weekend he developed increased shortness of breath +orthopnea. 10 pound weight gain over the last week. Also had some chest tightness over the weekend.   Presented to [facility] with increased shortness of breath. CXR with interstitial edema. BNP 772, Hgb 13, TSH 2.6 , HIV NR, HS Trop 52>72>60, SARS2 negative. Echo LVEF 25-30%, RV normal, Grade II DD. Diuresed with IV lasix with brisk diuresis.  Started on spiro, coreg, farxiga, and entresto.   Appears comfortable.   Review of Systems: [y] = yes, _0  = no   General: Weight gain _1 ; Weight loss _2 ; Anorexia _3 ; Fatigue [ Y]; Fever _4 ; Chills _5 ; Weakness _6   Cardiac: Chest pain/pressure _7 ; Resting SOB _8 ; Exertional SOB [ Y]; Orthopnea [ Y]; Pedal Edema [ Y]; Palpitations _9 ; Syncope _10 ; Presyncope _11 ; Paroxysmal nocturnal dyspnea_12   Pulmonary: Cough _13 ; Wheezing_14 ; Hemoptysis_15 ; Sputum _16 ; Snoring _17   GI: Vomiting_18 ; Dysphagia_19 ; Melena_20 ; Hematochezia _21 ; Heartburn_22 ; Abdominal pain _23 ; Constipation _24 ; Diarrhea _25 ; BRBPR _26   GU: Hematuria_27 ; Dysuria _28 ; Nocturia_29   Vascular: Pain in legs with walking _30 ; Pain in feet with lying flat _31 ; Non-healing sores _32 ; Stroke _33 ; TIA _34 ; Slurred speech _35 ;  Neuro: Headaches_36 ; Vertigo_37 ; Seizures_38 ; Paresthesias_39 ;Blurred vision _40 ; Diplopia _41 ; Vision changes _42   Ortho/Skin: Arthritis _43 ; Joint pain [ Y]; Muscle pain _44 ; Joint swelling _45 ; Back Pain [ Y]; Rash _46   Psych: Depression_47 ; Anxiety_48   Heme: Bleeding  problems _49 ; Clotting disorders _50 ; Anemia _51   Endocrine: Diabetes _52 ; Thyroid dysfunction_53   Home Medications Prior to Admission medications   Medication Sig Start Date End Date Taking? Authorizing Provider  acetaminophen (TYLENOL) 325 MG tablet Take 325 mg by mouth every 6 (six) hours as needed for moderate pain or headache.   Yes [provider]  amLODipine (NORVASC) 5 MG tablet Take 5 mg by mouth daily. 06/28/21  Yes [provider]  diphenhydrAMINE (BENADRYL) 25 MG tablet Take 25 mg by mouth every 6 (six) hours as needed for allergies.   Yes [provider]  Hypromellose Vertell Limber ALLERGY NASAL SPRAY NA) Place 1 spray into both nostrils at bedtime.   Yes [provider]    Past Medical History: Past Medical History:  Diagnosis Date   Arthritis    Dry skin    on chest using goldbond lotion on   Hypertension    Sciatic nerve pain right comes and goes    Past Surgical History: Past Surgical History:  Procedure Laterality Date   cyst removed left shoulder      MASS EXCISION Left 06/11/2020   Procedure: REMOVAL OF LEFT SHOULDER SUBCUTANEOUS MASS;  Surgeon: Michael Boston, MD;  Location: [facility];  Service: General;  Laterality: Left;  MAC VS GENERAL (ANESTHESIA CHOICE)   TOTAL HIP ARTHROPLASTY Left 07/01/2021   Procedure: LEFT TOTAL HIP ARTHROPLASTY ANTERIOR APPROACH;  Surgeon: Mcarthur Rossetti, MD;  Location: [facility];  Service: Orthopedics;  Laterality: Left;   TOTAL HIP ARTHROPLASTY Right 10/14/2021   Procedure: RIGHT TOTAL HIP ARTHROPLASTY ANTERIOR APPROACH;  Surgeon: Mcarthur Rossetti, MD;  Location: [facility];  Service: Orthopedics;  Laterality: Right;    Family History: Family History  Problem Relation Age of Onset   Heart failure Father    Heart attack Brother     Social History: Social History   Socioeconomic History   Marital status: Divorced    Spouse name: Not on file   Number of children: Not on  file   Years of education: Not on file   Highest education level: Not on file  Occupational History   Not on file  Tobacco Use   Smoking status: Every Day    Packs/day: 0.50    Years: 37.00    Pack years: 18.50    Types: Cigarettes   Smokeless tobacco: Never  Vaping Use   Vaping Use: Never used  Substance and Sexual Activity   Alcohol use: Never   Drug use: Never   Sexual activity: Not on file  Other Topics Concern   Not on file  Social History Narrative   Not on file   Social Determinants of Health   Financial Resource Strain: Not on file  Food Insecurity: Not on file  Transportation Needs: Not on file  Physical Activity: Not on file  Stress: Not on file  Social Connections: Not on file    Allergies:  No Known Allergies  Objective:    Vital Signs:   Temp:  [97.6 F (36.4 C)-98.8 F (37.1 C)] 97.7 F (36.5 C) (05/18 1103) Pulse Rate:  [58-92] 77 (05/18 0413) Resp:  [12-19] 13 (05/18 1103) BP: (121-154)/(57-100) 126/87 (05/18 1103) SpO2:  [94 %-99 %] 99 % (05/18 0413) Weight:  [110 kg-110.8 kg] 110 kg (05/18 0413)    Weight change: Filed Weights   05/10/22 1451 05/11/22 0413  Weight: 110.8 kg 110 kg    Intake/Output:   Intake/Output Summary (Last 24 hours) at 05/11/2022 1120 Last data filed at 05/10/2022 1246 Gross per 24 hour  Intake --  Output 1375 ml  Net -1375 ml      Physical Exam    General:  Well appearing. No resp difficulty HEENT: normal Neck: supple. JVP .5-6  Carotids 2+ bilat; no bruits. No lymphadenopathy or thyromegaly appreciated. Cor: PMI nondisplaced. Regular rate & rhythm. No rubs, gallops or murmurs. Lungs: clear Abdomen: soft, nontender, nondistended. No hepatosplenomegaly. No bruits or masses. Good bowel sounds. Extremities: no cyanosis, clubbing, rash, edema Neuro: alert & orientedx3, cranial nerves grossly intact. moves all 4 extremities w/o difficulty. Affect pleasant   Telemetry  SR with PVCs   EKG    SR with  PVC 6-14 per hour. 4 episodes NSVT 6-7  beats   Labs   Basic Metabolic Panel: Recent Labs  Lab 05/09/22 1154 05/10/22 1747 05/11/22 0724  NA 140 133* 136  K 3.6 3.7 3.9  CL 107 101 101  CO2 _0 GLUCOSE 122* 90 117*  BUN _1 CREATININE 0.98 1.20 1.24  CALCIUM 8.8* 8.9 9.1  MG  --   --  2.0    Liver Function Tests: No results for input(s): AST, ALT, ALKPHOS, BILITOT, PROT, ALBUMIN in the last 168 hours. No results for  input(s): LIPASE, AMYLASE in the last 168 hours. No results for input(s): AMMONIA in the last 168 hours.  CBC: Recent Labs  Lab 05/09/22 1154 05/11/22 0724  WBC 9.4 10.5  NEUTROABS  --  7.4  HGB 13.0 15.5  HCT 40.9 48.4  MCV 83.6 82.6  PLT 158 212    Cardiac Enzymes: No results for input(s): CKTOTAL, CKMB, CKMBINDEX, TROPONINI in the last 168 hours.  BNP: BNP (last 3 results) Recent Labs    05/09/22 1210  BNP 772.9*    ProBNP (last 3 results) No results for input(s): PROBNP in the last 8760 hours.   CBG: No results for input(s): GLUCAP in the last 168 hours.  Coagulation Studies: No results for input(s): LABPROT, INR in the last 72 hours.   Imaging   No results found.   Medications:     Current Medications:  atorvastatin  20 mg Oral Daily   carvedilol  3.125 mg Oral BID WC   dapagliflozin propanediol  10 mg Oral Daily   enoxaparin (LOVENOX) injection  40 mg Subcutaneous Q24H   fluticasone  1 spray Each Nare QHS   [START ON 05/12/2022] furosemide  40 mg Oral Daily   sacubitril-valsartan  1 tablet Oral BID   sodium chloride flush  3 mL Intravenous Q12H   spironolactone  12.5 mg Oral Daily    Infusions:  sodium chloride        Patient Profile  Alan Coffey is a 56 year old with h/o OA, S/P R and L hip replacement 2022, HTN, tobacco abuse, and HLD.  Father had heart failure.   Acute Systolic Heart Failure    Assessment/Plan   1. Acute HFrEF -ECHO EF 25-30%. Unclear etiology. Possible HTN , possible  genetic, and Possible PVC  induced. Will need to quantify PVC burden -TSH ok. HIV NR. BNP >700. HS Trop 52>72>60.  -Diuresed with IV lasix and now appears euvolemmic.  -Set up cath for today to assess coronaries and hemodynamics. Not sure he can get CMRI with bilateral hip replacements.  - Continue entresto and spiro at current dose.  - Continue low dose coreg.  - Continue farxiga 10 mg daily.  - Will need sleep study set up at his outpatient follow up.   2. HTN  -Controlled with addition of entresto and spiro.   3. HLD -Continue atorvastin.   4. Tobacco Abuse -Discussed cessation.   5. PVCs/NSVT  -Multifocal PVCs.  -6-14 per hour. 4 brief episode of NSVT  -Will need sleep study.    Plan cath later today. The patient understands that risks included but are not limited to stroke (1 in 1000), death (1 in 61), kidney failure [usually temporary] (1 in 500), bleeding (1 in 200), allergic reaction [possibly serious] (1 in 200).  The patient understands and agrees to proceed.    Length of Stay: 1  Amy Clegg, NP  05/11/2022, 11:20 AM  Advanced Heart Failure Team Pager [phone] (M-F; 7a - 5p)  Please contact [facility] for night-coverage after hours (4p -7a ) and weekends on [email or web address]  Patient seen and examined with the above-signed Advanced Practice Provider and/or Housestaff. I personally reviewed laboratory data, imaging studies and relevant notes. I independently examined the patient and formulated the important aspects of the plan. I have edited the note to reflect any of my changes or salient points. I have personally discussed the plan with the patient and/or family.  56 y/o male with FHx HF and tobacco use. Admitted with acute systolic  HF. Echo EF 30% + RWMA. ECG and hstrop ok. + frequent PVCs  General:  Well appearing. No resp difficulty HEENT: normal Neck: supple. no JVD. Carotids 2+ bilat; no bruits. No lymphadenopathy or thryomegaly appreciated. Cor: PMI  nondisplaced. Regular rate & rhythm. No rubs, gallops or murmurs. Lungs: clear Abdomen: soft, nontender, nondistended. No hepatosplenomegaly. No bruits or masses. Good bowel sounds. Extremities: no cyanosis, clubbing, rash, edema Neuro: alert & orientedx3, cranial nerves grossly intact. moves all 4 extremities w/o difficulty. Affect pleasant  Etiology of LV dysfunction remains unclear. Plan R/L cath today +/- cMRI. Continue to titrate GDMT. Follow PVC burden .  Glori Bickers, MD  12:44 PM

## 2022-05-11 NOTE — Progress Notes (Addendum)
  Progress Note   Patient: Alan Coffey [id number] DOB: [birth year] DOA: 05/09/2022     1 DOS: the patient was seen and examined on 05/11/2022   Brief hospital course: Alan Coffey was admitted to the hospital with the working diagnosis of decompensated heart failure  56 yo male with past medical history of hypertension and dyslipidemia who presented with dyspnea. Worsening symptoms over last 24 hr before admission, associated with lower extremity edema and non productive cough. Positive orthopnea. On his initial physical examination his blood pressure was 156/86, HR 76, RR 24, 02 saturation 96%, lungs with decreased breath sounds, heart with S1 and S2 present and rhythmic, abdomen not distended and trace lower extremity edema.  Chest radiograph with cardiomegaly and bilateral hilar vascular congestion, bilateral interstitial infiltrates, with cephalization of the vasculature.   EKG 100 bpm, normal axis, normal intervals, sinus rhythm, with poor R wave progression, no significant ST segment changes, negative T wave v5 and v6 with positive LVH.     Assessment and Plan: * Acute clinical systolic heart failure (HCC) Echocardiogram with reduced LV systolic function to 25 to 30%, internal cavity with mild dilatation. Mild LVH. Small pericardial effusion. No significant valvular disease.   Urine output 1,375 ml  Blood pressure systolic [id number] range.   Telemetry personally reviewed: HR rate 80 bpm range.  Positive non sustained ventricular tachycardia up to 8 beats.  Polymorphic PVC with occasional bigeminy.   Likely hypertensive cardiomyopathy, but will need cardiac catheterization to complete work up. Keep NPO for now, [facility] for po meds.   Continue medical therapy with empagliflozin, add carvedilol and change furosemide to oral.  Follow up on renal function today, if stable plan to start patient on entresto and spironolactone.  Target blood pressure systolic less than [id number] mmHg.     HTN (hypertension) Uncontrolled blood pressure, target systolic blood pressure [id number] mmHg or lower.   Pending renal function for today, if stable will plan to add entresto and spironolactone.   HLD (hyperlipidemia) Elevated LDL 163.  Continue statin therapy.   Class 2 obesity Calculated BMI is 37,1        Subjective: Patient is feeling better, dyspnea and edema have improved.   Physical Exam: Vitals:   05/10/22 1957 05/11/22 0022 05/11/22 0413 05/11/22 0750  BP: (!) 154/92 (!) 121/57 (!) 142/88 (!) 144/87  Pulse: 79 65 77   Resp: 17 17 15 12   Temp: 98.6 F (37 C) 98.8 F (37.1 C) 98.1 F (36.7 C) 97.6 F (36.4 C)  TempSrc: Oral Oral Oral   SpO2: 95% 98% 99%   Weight:   110 kg   Height:       Neurology awake and alert ENT with no pallor Cardiovascular with no JVD, heart with S1 and S2 present and rhythmic with no gallops, rubs or murmurs No lower extremity edema Respiratory with no rales or wheezing Abdomen not distended  Data Reviewed:    Family Communication: no family at the bedside   Disposition: Status is: Inpatient Remains inpatient appropriate because: heart failure workup   Planned Discharge Destination: Home    Author: Tawni Millers, MD 05/11/2022 9:06 AM  For on call review [email or web address].

## 2022-05-11 NOTE — TOC Benefit Eligibility Note (Addendum)
Patient Teacher, English as a foreign language completed.    The patient is currently admitted and upon discharge could be taking Entresto 24-26 mg.  The current 30 day co-pay is, $15.00.   The patient is currently admitted and upon discharge could be taking Farxiga 10 mg.  The current 30 day co-pay is, $15.00.   The patient is currently admitted and upon discharge could be taking Jardiance 10 mg.  The current 30 day co-pay is, $15.00.   The patient is currently admitted and upon discharge could be taking Vascepa 1 g.  (Plan requires Brand name)  Requires Prior Authorization  The patient is insured through [employer] Commercial [employer]     Manon Banbury, [place] Patient Advocate Specialist [place] Patient Advocate Team Direct Number: [phone]  Fax: [phone]

## 2022-05-11 NOTE — Progress Notes (Signed)
Heart Failure Navigator Progress Note  Assessed for Heart & Vascular TOC clinic readiness.  Patient does not meet criteria due to consult with Advance Heart Failure Team.     Saydee Zolman, BSN, RN Heart Failure Nurse Navigator Secure Chat Only   

## 2022-05-11 NOTE — H&P (View-Only) (Signed)
Advanced Heart Failure Team Consult Note   Primary Physician: Sandi Mariscal, MD PCP-Cardiologist:  None  Reason for Consultation: Acute Systolic Heart Failure   HPI:    Alan Coffey is seen today for evaluation of acute heart failure  at the request of Dr Cathlean Sauer  Alan Coffey is a 56 year old with h/o OA, S/P R and L hip replacement 2022, HTN and HLD.  Had COVID earlier this year.  Father had heart failure. Brother had MI.   Over the weekend he developed increased shortness of breath +orthopnea. 10 pound weight gain over the last week. Also had some chest tightness over the weekend.   Presented to [facility] with increased shortness of breath. CXR with interstitial edema. BNP 772, Hgb 13, TSH 2.6 , HIV NR, HS Trop 52>72>60, SARS2 negative. Echo LVEF 25-30%, RV normal, Grade II DD. Diuresed with IV lasix with brisk diuresis.  Started on spiro, coreg, farxiga, and entresto.   Appears comfortable.   Review of Systems: [y] = yes, _0  = no   General: Weight gain _1 ; Weight loss _2 ; Anorexia _3 ; Fatigue [ Y]; Fever _4 ; Chills _5 ; Weakness _6   Cardiac: Chest pain/pressure _7 ; Resting SOB _8 ; Exertional SOB [ Y]; Orthopnea [ Y]; Pedal Edema [ Y]; Palpitations _9 ; Syncope _10 ; Presyncope _11 ; Paroxysmal nocturnal dyspnea_12   Pulmonary: Cough _13 ; Wheezing_14 ; Hemoptysis_15 ; Sputum _16 ; Snoring _17   GI: Vomiting_18 ; Dysphagia_19 ; Melena_20 ; Hematochezia _21 ; Heartburn_22 ; Abdominal pain _23 ; Constipation _24 ; Diarrhea _25 ; BRBPR _26   GU: Hematuria_27 ; Dysuria _28 ; Nocturia_29   Vascular: Pain in legs with walking _30 ; Pain in feet with lying flat _31 ; Non-healing sores _32 ; Stroke _33 ; TIA _34 ; Slurred speech _35 ;  Neuro: Headaches_36 ; Vertigo_37 ; Seizures_38 ; Paresthesias_39 ;Blurred vision _40 ; Diplopia _41 ; Vision changes _42   Ortho/Skin: Arthritis _43 ; Joint pain [ Y]; Muscle pain _44 ; Joint swelling _45 ; Back Pain [ Y]; Rash _46   Psych: Depression_47 ; Anxiety_48   Heme: Bleeding  problems _49 ; Clotting disorders _50 ; Anemia _51   Endocrine: Diabetes _52 ; Thyroid dysfunction_53   Home Medications Prior to Admission medications   Medication Sig Start Date End Date Taking? Authorizing Provider  acetaminophen (TYLENOL) 325 MG tablet Take 325 mg by mouth every 6 (six) hours as needed for moderate pain or headache.   Yes [provider]  amLODipine (NORVASC) 5 MG tablet Take 5 mg by mouth daily. 06/28/21  Yes [provider]  diphenhydrAMINE (BENADRYL) 25 MG tablet Take 25 mg by mouth every 6 (six) hours as needed for allergies.   Yes [provider]  Hypromellose Vertell Limber ALLERGY NASAL SPRAY NA) Place 1 spray into both nostrils at bedtime.   Yes [provider]    Past Medical History: Past Medical History:  Diagnosis Date   Arthritis    Dry skin    on chest using goldbond lotion on   Hypertension    Sciatic nerve pain right comes and goes    Past Surgical History: Past Surgical History:  Procedure Laterality Date   cyst removed left shoulder      MASS EXCISION Left 06/11/2020   Procedure: REMOVAL OF LEFT SHOULDER SUBCUTANEOUS MASS;  Surgeon: Michael Boston, MD;  Location: [facility];  Service: General;  Laterality: Left;  MAC VS GENERAL (ANESTHESIA CHOICE)   TOTAL HIP ARTHROPLASTY Left 07/01/2021   Procedure: LEFT TOTAL HIP ARTHROPLASTY ANTERIOR APPROACH;  Surgeon: Mcarthur Rossetti, MD;  Location: [facility];  Service: Orthopedics;  Laterality: Left;   TOTAL HIP ARTHROPLASTY Right 10/14/2021   Procedure: RIGHT TOTAL HIP ARTHROPLASTY ANTERIOR APPROACH;  Surgeon: Mcarthur Rossetti, MD;  Location: [facility];  Service: Orthopedics;  Laterality: Right;    Family History: Family History  Problem Relation Age of Onset   Heart failure Father    Heart attack Brother     Social History: Social History   Socioeconomic History   Marital status: Divorced    Spouse name: Not on file   Number of children: Not on  file   Years of education: Not on file   Highest education level: Not on file  Occupational History   Not on file  Tobacco Use   Smoking status: Every Day    Packs/day: 0.50    Years: 37.00    Pack years: 18.50    Types: Cigarettes   Smokeless tobacco: Never  Vaping Use   Vaping Use: Never used  Substance and Sexual Activity   Alcohol use: Never   Drug use: Never   Sexual activity: Not on file  Other Topics Concern   Not on file  Social History Narrative   Not on file   Social Determinants of Health   Financial Resource Strain: Not on file  Food Insecurity: Not on file  Transportation Needs: Not on file  Physical Activity: Not on file  Stress: Not on file  Social Connections: Not on file    Allergies:  No Known Allergies  Objective:    Vital Signs:   Temp:  [97.6 F (36.4 C)-98.8 F (37.1 C)] 97.7 F (36.5 C) (05/18 1103) Pulse Rate:  [58-92] 77 (05/18 0413) Resp:  [12-19] 13 (05/18 1103) BP: (121-154)/(57-100) 126/87 (05/18 1103) SpO2:  [94 %-99 %] 99 % (05/18 0413) Weight:  [110 kg-110.8 kg] 110 kg (05/18 0413)    Weight change: Filed Weights   05/10/22 1451 05/11/22 0413  Weight: 110.8 kg 110 kg    Intake/Output:   Intake/Output Summary (Last 24 hours) at 05/11/2022 1120 Last data filed at 05/10/2022 1246 Gross per 24 hour  Intake --  Output 1375 ml  Net -1375 ml      Physical Exam    General:  Well appearing. No resp difficulty HEENT: normal Neck: supple. JVP .5-6  Carotids 2+ bilat; no bruits. No lymphadenopathy or thyromegaly appreciated. Cor: PMI nondisplaced. Regular rate & rhythm. No rubs, gallops or murmurs. Lungs: clear Abdomen: soft, nontender, nondistended. No hepatosplenomegaly. No bruits or masses. Good bowel sounds. Extremities: no cyanosis, clubbing, rash, edema Neuro: alert & orientedx3, cranial nerves grossly intact. moves all 4 extremities w/o difficulty. Affect pleasant   Telemetry  SR with PVCs   EKG    SR with  PVC 6-14 per hour. 4 episodes NSVT 6-7  beats   Labs   Basic Metabolic Panel: Recent Labs  Lab 05/09/22 1154 05/10/22 1747 05/11/22 0724  NA 140 133* 136  K 3.6 3.7 3.9  CL 107 101 101  CO2 _0 GLUCOSE 122* 90 117*  BUN _1 CREATININE 0.98 1.20 1.24  CALCIUM 8.8* 8.9 9.1  MG  --   --  2.0    Liver Function Tests: No results for input(s): AST, ALT, ALKPHOS, BILITOT, PROT, ALBUMIN in the last 168 hours. No results for  input(s): LIPASE, AMYLASE in the last 168 hours. No results for input(s): AMMONIA in the last 168 hours.  CBC: Recent Labs  Lab 05/09/22 1154 05/11/22 0724  WBC 9.4 10.5  NEUTROABS  --  7.4  HGB 13.0 15.5  HCT 40.9 48.4  MCV 83.6 82.6  PLT 158 212    Cardiac Enzymes: No results for input(s): CKTOTAL, CKMB, CKMBINDEX, TROPONINI in the last 168 hours.  BNP: BNP (last 3 results) Recent Labs    05/09/22 1210  BNP 772.9*    ProBNP (last 3 results) No results for input(s): PROBNP in the last 8760 hours.   CBG: No results for input(s): GLUCAP in the last 168 hours.  Coagulation Studies: No results for input(s): LABPROT, INR in the last 72 hours.   Imaging   No results found.   Medications:     Current Medications:  atorvastatin  20 mg Oral Daily   carvedilol  3.125 mg Oral BID WC   dapagliflozin propanediol  10 mg Oral Daily   enoxaparin (LOVENOX) injection  40 mg Subcutaneous Q24H   fluticasone  1 spray Each Nare QHS   [START ON 05/12/2022] furosemide  40 mg Oral Daily   sacubitril-valsartan  1 tablet Oral BID   sodium chloride flush  3 mL Intravenous Q12H   spironolactone  12.5 mg Oral Daily    Infusions:  sodium chloride        Patient Profile  Alan Coffey is a 55 year old with h/o OA, S/P R and L hip replacement 2022, HTN, tobacco abuse, and HLD.  Father had heart failure.   Acute Systolic Heart Failure    Assessment/Plan   1. Acute HFrEF -ECHO EF 25-30%. Unclear etiology. Possible HTN , possible  genetic, and Possible PVC  induced. Will need to quantify PVC burden -TSH ok. HIV NR. BNP >700. HS Trop 52>72>60.  -Diuresed with IV lasix and now appears euvolemmic.  -Set up cath for today to assess coronaries and hemodynamics. Not sure he can get CMRI with bilateral hip replacements.  - Continue entresto and spiro at current dose.  - Continue low dose coreg.  - Continue farxiga 10 mg daily.  - Will need sleep study set up at his outpatient follow up.   2. HTN  -Controlled with addition of entresto and spiro.   3. HLD -Continue atorvastin.   4. Tobacco Abuse -Discussed cessation.   5. PVCs/NSVT  -Multifocal PVCs.  -6-14 per hour. 4 brief episode of NSVT  -Will need sleep study.    Plan cath later today. The patient understands that risks included but are not limited to stroke (1 in 1000), death (1 in 1000), kidney failure [usually temporary] (1 in 500), bleeding (1 in 200), allergic reaction [possibly serious] (1 in 200).  The patient understands and agrees to proceed.    Length of Stay: 1  Amy Clegg, NP  05/11/2022, 11:20 AM  Advanced Heart Failure Team Pager [phone] (M-F; 7a - 5p)  Please contact [facility] for night-coverage after hours (4p -7a ) and weekends on [email or web address]  Patient seen and examined with the above-signed Advanced Practice Provider and/or Housestaff. I personally reviewed laboratory data, imaging studies and relevant notes. I independently examined the patient and formulated the important aspects of the plan. I have edited the note to reflect any of my changes or salient points. I have personally discussed the plan with the patient and/or family.  55 y/o male with FHx HF and tobacco use. Admitted with acute systolic   HF. Echo EF 30% + RWMA. ECG and hstrop ok. + frequent PVCs  General:  Well appearing. No resp difficulty HEENT: normal Neck: supple. no JVD. Carotids 2+ bilat; no bruits. No lymphadenopathy or thryomegaly appreciated. Cor: PMI  nondisplaced. Regular rate & rhythm. No rubs, gallops or murmurs. Lungs: clear Abdomen: soft, nontender, nondistended. No hepatosplenomegaly. No bruits or masses. Good bowel sounds. Extremities: no cyanosis, clubbing, rash, edema Neuro: alert & orientedx3, cranial nerves grossly intact. moves all 4 extremities w/o difficulty. Affect pleasant  Etiology of LV dysfunction remains unclear. Plan R/L cath today +/- cMRI. Continue to titrate GDMT. Follow PVC burden .  Glori Bickers, MD  12:44 PM

## 2022-05-12 ENCOUNTER — Inpatient Hospital Stay (HOSPITAL_COMMUNITY): Payer: 59

## 2022-05-12 ENCOUNTER — Other Ambulatory Visit (HOSPITAL_COMMUNITY): Payer: Self-pay

## 2022-05-12 ENCOUNTER — Encounter (HOSPITAL_COMMUNITY): Payer: Self-pay | Admitting: Internal Medicine

## 2022-05-12 DIAGNOSIS — I351 Nonrheumatic aortic (valve) insufficiency: Secondary | ICD-10-CM

## 2022-05-12 DIAGNOSIS — I340 Nonrheumatic mitral (valve) insufficiency: Secondary | ICD-10-CM | POA: Diagnosis not present

## 2022-05-12 DIAGNOSIS — E669 Obesity, unspecified: Secondary | ICD-10-CM | POA: Diagnosis not present

## 2022-05-12 DIAGNOSIS — I361 Nonrheumatic tricuspid (valve) insufficiency: Secondary | ICD-10-CM

## 2022-05-12 DIAGNOSIS — E785 Hyperlipidemia, unspecified: Secondary | ICD-10-CM | POA: Diagnosis not present

## 2022-05-12 DIAGNOSIS — I10 Essential (primary) hypertension: Secondary | ICD-10-CM | POA: Diagnosis not present

## 2022-05-12 DIAGNOSIS — I5021 Acute systolic (congestive) heart failure: Secondary | ICD-10-CM | POA: Diagnosis not present

## 2022-05-12 LAB — BASIC METABOLIC PANEL
Anion gap: 8 (ref 5–15)
BUN: 19 mg/dL (ref 6–20)
CO2: 26 mmol/L (ref 22–32)
Calcium: 8.8 mg/dL — ABNORMAL LOW (ref 8.9–10.3)
Chloride: 103 mmol/L (ref 98–111)
Creatinine, Ser: 1.15 mg/dL (ref 0.61–1.24)
GFR, Estimated: >60 mL/min (ref >60)
Glucose, Bld: 108 mg/dL — ABNORMAL HIGH (ref 70–99)
Potassium: 3.6 mmol/L (ref 3.5–5.1)
Sodium: 137 mmol/L (ref 135–145)

## 2022-05-12 LAB — POCT I-STAT 7, (LYTES, BLD GAS, ICA,H+H)
Acid-Base Excess: 2 mmol/L (ref 0.0–2.0)
Bicarbonate: 25.3 mmol/L (ref 20.0–28.0)
Calcium, Ion: 1.05 mmol/L — ABNORMAL LOW (ref 1.15–1.40)
HCT: 47 % (ref 39.0–52.0)
Hemoglobin: 16 g/dL (ref 13.0–17.0)
O2 Saturation: 95 %
Potassium: 3.2 mmol/L — ABNORMAL LOW (ref 3.5–5.1)
Sodium: 139 mmol/L (ref 135–145)
TCO2: 26 mmol/L (ref 22–32)
pCO2 arterial: 35.5 mmHg (ref 32–48)
pH, Arterial: 7.46 — ABNORMAL HIGH (ref 7.35–7.45)
pO2, Arterial: 70 mmHg — ABNORMAL LOW (ref 83–108)

## 2022-05-12 LAB — POCT I-STAT EG7
Acid-Base Excess: 3 mmol/L — ABNORMAL HIGH (ref 0.0–2.0)
Bicarbonate: 27.5 mmol/L (ref 20.0–28.0)
Calcium, Ion: 1.17 mmol/L (ref 1.15–1.40)
HCT: 48 % (ref 39.0–52.0)
Hemoglobin: 16.3 g/dL (ref 13.0–17.0)
O2 Saturation: 60 %
Potassium: 3.4 mmol/L — ABNORMAL LOW (ref 3.5–5.1)
Sodium: 138 mmol/L (ref 135–145)
TCO2: 29 mmol/L (ref 22–32)
pCO2, Ven: 41.6 mmHg — ABNORMAL LOW (ref 44–60)
pH, Ven: 7.427 (ref 7.25–7.43)
pO2, Ven: 31 mmHg — CL (ref 32–45)

## 2022-05-12 LAB — HEMOGLOBIN A1C
Hgb A1c MFr Bld: 5.4 % (ref 4.8–5.6)
Mean Plasma Glucose: 108.28 mg/dL

## 2022-05-12 IMAGING — MR MR CARD MORPHOLOGY WO/W CM
45 of 48 series · 45 of 48 positions shown · IV contrast (Contrast agent)
Comparison: none

CLINICAL DATA: Clinical question of Cardiomyopathy
Study assumes HCT of 48 and BSA of 2.31.

EXAM:
CARDIAC MRI
TECHNIQUE: The patient was scanned on a 1.5 Tesla GE magnet. A dedicated
cardiac coil was used. Functional imaging was done using Fiesta
sequences. [DATE], and 4 chamber views were done to assess for RWMA's.
Modified ARISSA rule using a short axis stack was used to
calculate an ejection fraction on a dedicated work station using
Circle software. The patient received 10 cc of Gadavist. After 10
minutes inversion recovery sequences were used to assess for
infiltration and scar tissue. VENC assessment of the aortic valve.
CONTRAST:  10 cc  of Gadavist

[Series 4: t2_haste_db_tra_bh · axial · 8.0mm · 1.56mm/px · 1 of 16 slices shown]
[im 1/16]
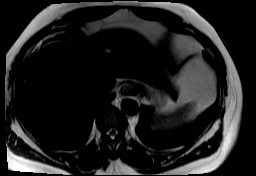

[Series 9: bSSFP · oblique · 8.0mm · 1.70mm/px · 1 of 25 slices shown (1 of 22)]
[im 1/25]
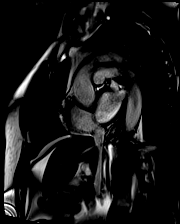

[Series 10: bSSFP · oblique · 8.0mm · 1.70mm/px · 1 of 25 slices shown (2 of 22)]
[im 1/25]
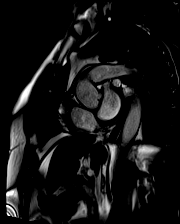

[Series 11: bSSFP · oblique · 8.0mm · 1.70mm/px · 1 of 25 slices shown (3 of 22)]
[im 1/25]
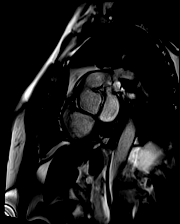

[Series 12: bSSFP · oblique · 8.0mm · 1.70mm/px · 1 of 25 slices shown (4 of 22)]
[im 1/25]
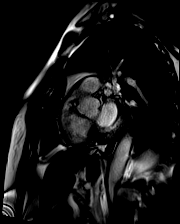

[Series 13: bSSFP · oblique · 8.0mm · 1.70mm/px · 1 of 25 slices shown (5 of 22)]
[im 1/25]
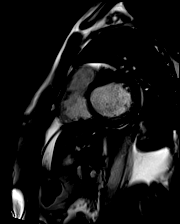

[Series 14: bSSFP · oblique · 8.0mm · 1.70mm/px · 1 of 25 slices shown (6 of 22)]
[im 1/25]
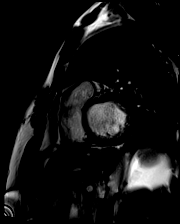

[Series 15: bSSFP · oblique · 8.0mm · 1.70mm/px · 1 of 25 slices shown (7 of 22)]
[im 1/25]
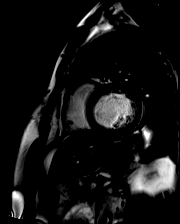

[Series 16: bSSFP · oblique · 8.0mm · 1.70mm/px · 1 of 25 slices shown (8 of 22)]
[im 1/25]
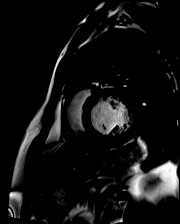

[Series 17: bSSFP · oblique · 8.0mm · 1.70mm/px · 1 of 25 slices shown (9 of 22)]
[im 1/25]
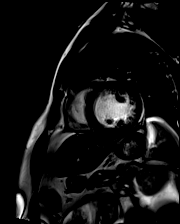

[Series 18: bSSFP · oblique · 8.0mm · 1.70mm/px · 1 of 25 slices shown (10 of 22)]
[im 1/25]
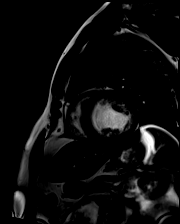

[Series 19: bSSFP · oblique · 8.0mm · 1.70mm/px · 1 of 25 slices shown (11 of 22)]
[im 1/25]
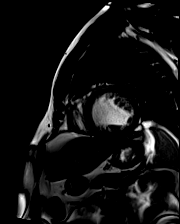

[Series 20: bSSFP · oblique · 8.0mm · 1.70mm/px · 1 of 25 slices shown (12 of 22)]
[im 1/25]
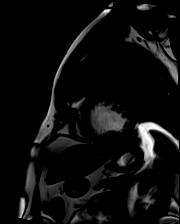

[Series 21: bSSFP · oblique · 8.0mm · 1.70mm/px · 1 of 25 slices shown (13 of 22)]
[im 1/25]
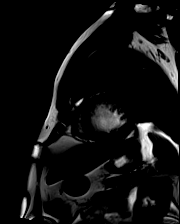

[Series 22: bSSFP · oblique · 8.0mm · 1.70mm/px · 1 of 25 slices shown (14 of 22)]
[im 1/25]
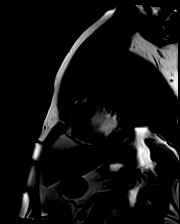

[Series 23: bSSFP · oblique · 8.0mm · 1.70mm/px · 1 of 25 slices shown (15 of 22)]
[im 1/25]
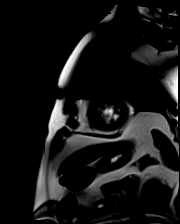

[Series 24: bSSFP · oblique · 8.0mm · 1.70mm/px · 1 of 25 slices shown (16 of 22)]
[im 1/25]
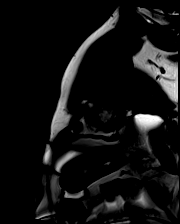

[Series 25: bSSFP · oblique · 8.0mm · 1.70mm/px · 1 of 25 slices shown (17 of 22)]
[im 1/25]
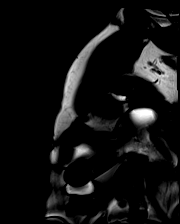

[Series 26: bSSFP · oblique · 8.0mm · 1.70mm/px · 1 of 25 slices shown (18 of 22)]
[im 1/25]
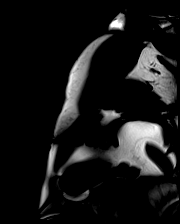

[Series 27: bSSFP · axial · 6.0mm · 1.48mm/px · 1 of 25 slices shown (19 of 22)]
[im 1/25]
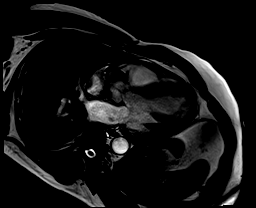

[Series 28: bSSFP · oblique · 6.0mm · 1.48mm/px · 1 of 25 slices shown (20 of 22)]
[im 1/25]
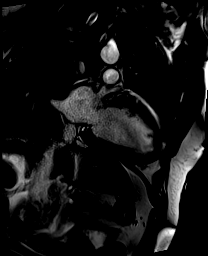

[Series 29: bSSFP · axial · 6.0mm · 1.48mm/px · 1 of 25 slices shown (21 of 22)]
[im 1/25]
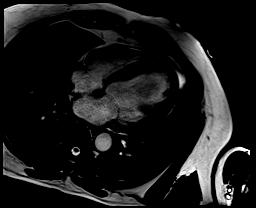

[Series 30: (id)_long_t1 · oblique · 8.0mm · 1.56mm/px · 1 of 24 slices shown]
[im 1/24]
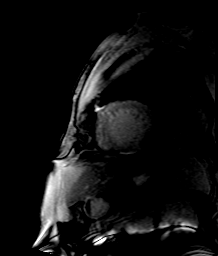

[Series 31: (id)_long_t1_moco · oblique · 8.0mm · 1.56mm/px · 1 of 24 slices shown]
[im 1/24]
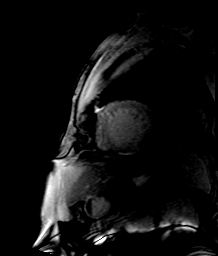

[Series 32: (id)_long_t1_moco_t1 · 1 of 3 slices shown (1 of 2)]
[im 1/3]
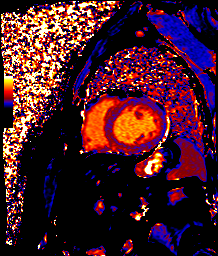

[Series 32: (id)_long_t1_moco_t1 · oblique · 8.0mm · 1.56mm/px · 1 of 3 slices shown (2 of 2)]
[im 1/3]
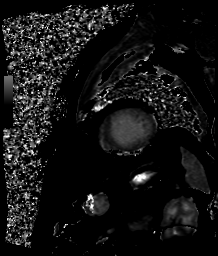

[Series 34: (id)_trufi · oblique · 8.0mm · 2.08mm/px · 1 of 9 slices shown]
[im 1/9]
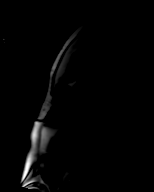

[Series 35: (id)_trufi_moco · oblique · 8.0mm · 2.08mm/px · 1 of 9 slices shown]
[im 1/9]
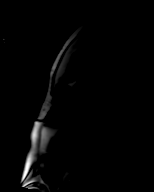

[Series 36: (id)_trufi_moco_t2 · oblique · 8.0mm · 2.08mm/px · 1 of 3 slices shown]
[im 1/3]
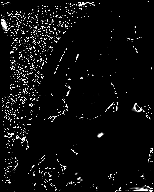

[Series 38: bSSFP · axial · 6.0mm · 1.48mm/px · 1 of 25 slices shown (22 of 22)]
[im 1/25]
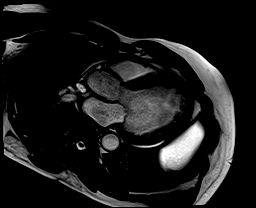

[Series 39: cine_trufi_short axis_cs_2_shot · oblique · 8.0mm · 1.48mm/px · 1 of 25 slices shown (1 of 15)]
[im 1/25]
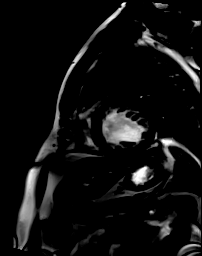

[Series 39: cine_trufi_short axis_cs_2_shot · oblique · 8.0mm · 1.48mm/px · 1 of 25 slices shown (2 of 15)]
[im 1/25]
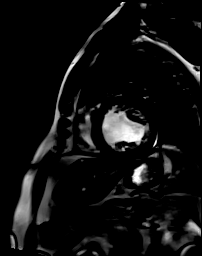

[Series 39: cine_trufi_short axis_cs_2_shot · oblique · 8.0mm · 1.48mm/px · 1 of 25 slices shown (3 of 15)]
[im 1/25]
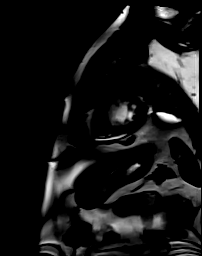

[Series 39: cine_trufi_short axis_cs_2_shot · oblique · 8.0mm · 1.48mm/px · 1 of 25 slices shown (4 of 15)]
[im 1/25]
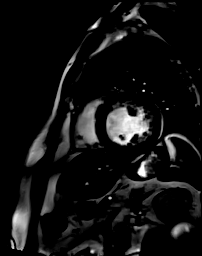

[Series 39: cine_trufi_short axis_cs_2_shot · oblique · 8.0mm · 1.48mm/px · 1 of 25 slices shown (5 of 15)]
[im 1/25]
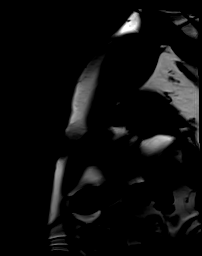

[Series 39: cine_trufi_short axis_cs_2_shot · oblique · 8.0mm · 1.48mm/px · 1 of 25 slices shown (6 of 15)]
[im 1/25]
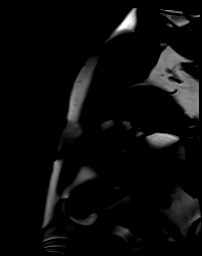

[Series 39: cine_trufi_short axis_cs_2_shot · oblique · 8.0mm · 1.48mm/px · 1 of 25 slices shown (7 of 15)]
[im 1/25]
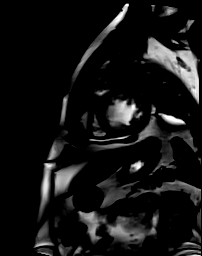

[Series 39: cine_trufi_short axis_cs_2_shot · oblique · 8.0mm · 1.48mm/px · 1 of 25 slices shown (8 of 15)]
[im 1/25]
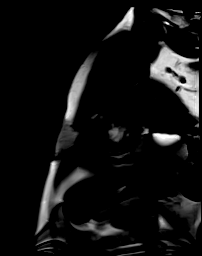

[Series 39: cine_trufi_short axis_cs_2_shot · oblique · 8.0mm · 1.48mm/px · 1 of 25 slices shown (9 of 15)]
[im 1/25]
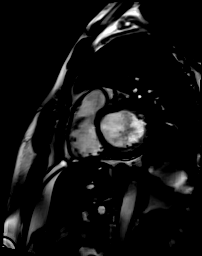

[Series 39: cine_trufi_short axis_cs_2_shot · oblique · 8.0mm · 1.48mm/px · 1 of 25 slices shown (10 of 15)]
[im 1/25]
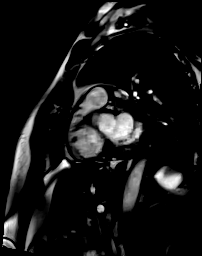

[Series 39: cine_trufi_short axis_cs_2_shot · oblique · 8.0mm · 1.48mm/px · 1 of 25 slices shown (11 of 15)]
[im 1/25]
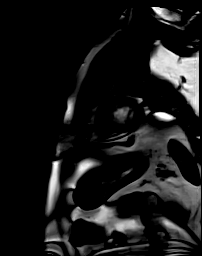

[Series 39: cine_trufi_short axis_cs_2_shot · oblique · 8.0mm · 1.48mm/px · 1 of 25 slices shown (12 of 15)]
[im 1/25]
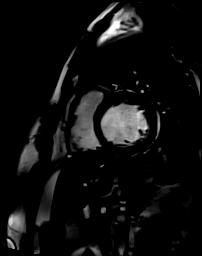

[Series 39: cine_trufi_short axis_cs_2_shot · oblique · 8.0mm · 1.48mm/px · 1 of 25 slices shown (13 of 15)]
[im 1/25]
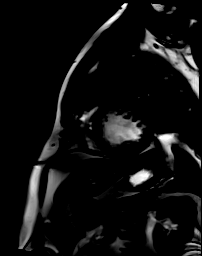

[Series 39: cine_trufi_short axis_cs_2_shot · oblique · 8.0mm · 1.48mm/px · 1 of 25 slices shown (14 of 15)]
[im 1/25]
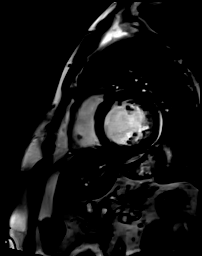

[Series 39: cine_trufi_short axis_cs_2_shot · oblique · 8.0mm · 1.48mm/px · 1 of 25 slices shown (15 of 15)]
[im 1/25]
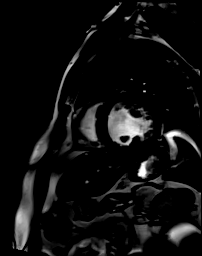

[45 of 48 positions shown; findings below may reference images not displayed]

FINDINGS: 1. Moderately increased left ventricular size, with LVEDD 69 mm, but
LVEDVi 124 mL/m2.

Mild concentric hypertrophy, with intraventricular septal thickness
of 8 mm, posterior wall thickness of 7 mm, but myocardial mass index
of 89 g/m2.

Severe left ventricular systolic dysfunction (LVEF =21%).
Paradoxical septal motion and global hypokinesis. No LV thrombus.
GLS -4.8%

Left ventricular parametric mapping notable for normal T2.

ECV is elevated in the mid inferoseptal (43%) and severely elevated
in the basal inferoseptal (63%).

There is late gadolinium enhancement in the left ventricular
myocardium: There is mid myocardial and patch LGE in the mid
inferior, and basal septal (anterior and inferior, predominantly mid
myocardial but transmural in the basal anteroseptal).

2. Normal right ventricular size with RVEDVI 83 mL/m2.

Normal right ventricular thickness.

Moderately decrease right ventricular systolic function (RVEF =35%).
Septal hypokinesis.

3.  Normal left and right atrial size.

4. Normal size of the aortic root, ascending aorta and pulmonary
artery.

5. Valve assessment:

Aortic Valve: Tri-leaflet aortic valve. Mild aortic regurgitation,
regurgitation fraction 3%. Mean gradient 2 mm Hg.

Pulmonic Valve: Qualitatively, there is no significant
regurgitation.

Tricuspid Valve: Qualitatively, there is mild central regurgitation.

Mitral Valve: Mild to moderate ventricular functional central mitral
regurgitation. Regurgitant volume 12 mL, regurgitant fraction 21%.

6.  Normal pericardium.  No pericardial effusion.

7. Grossly, no extracardiac findings. Recommended dedicated study if
concerned for non-cardiac pathology.

8. Patient motion artifacts noted. This decreased the sensitivity of
ventricular volumes and valve assessment.
IMPRESSION: Severely reduced LV function 21%

Mild to moderate mitral regurgitation.

LGE is septal and occasional epicardial without RV free wall
involvement and without being truly multi-focal. Some criteria can
be seen of cardiac sarcoidosis. Low threshold for PET evaluation.

## 2022-05-12 MED ORDER — MEXILETINE HCL 150 MG PO CAPS
300.0000 mg | ORAL_CAPSULE | Freq: Two times a day (BID) | ORAL | Status: DC
  Administered 2022-05-12 – 2022-05-13 (×2): 300 mg via ORAL
  Filled 2022-05-12 (×2): qty 2

## 2022-05-12 MED ORDER — ASPIRIN 81 MG PO TBEC
81.0000 mg | DELAYED_RELEASE_TABLET | Freq: Every day | ORAL | Status: DC
Start: 2022-05-12 — End: 2022-05-13
  Administered 2022-05-12 – 2022-05-13 (×2): 81 mg via ORAL
  Filled 2022-05-12 (×2): qty 1

## 2022-05-12 MED ORDER — GADOBUTROL 1 MMOL/ML IV SOLN
10.0000 mL | Freq: Once | INTRAVENOUS | Status: AC | PRN
  Administered 2022-05-12: 10 mL via INTRAVENOUS

## 2022-05-12 MED ORDER — SPIRONOLACTONE 25 MG PO TABS
25.0000 mg | ORAL_TABLET | Freq: Every day | ORAL | Status: DC
  Administered 2022-05-13: 25 mg via ORAL
  Filled 2022-05-12: qty 1

## 2022-05-12 NOTE — Progress Notes (Addendum)
Advanced Heart Failure Rounding Note  PCP-Cardiologist: None   Subjective:    R/L cath yesterday. Minimal CAD   Ao = 83/65 (71) LV = 98/9 RA = 2 RV = 20/7 PA = 20/12 (15) PCW = 5 Fick cardiac output/index = 4.1/1.9 PVR = 2.4 WU SVR = 1,244 Ao sat = 95% PA sat = 60%, 61%  Mexiletine added yesterday. PVCs improving, 2-10/min. Denies palpitations.   Breathing improved. No exertional dyspnea. Able to lay flat w/o orthopnea.   BP well controlled.   Awaiting cMRI today.    Objective:   Weight Range: 111.2 kg Body mass index is 37.28 kg/m.   Vital Signs:   Temp:  [97.6 F (36.4 C)-98.2 F (36.8 C)] 97.7 F (36.5 C) (05/19 0804) Pulse Rate:  [59-89] 60 (05/19 0804) Resp:  [13-25] 13 (05/19 0804) BP: (104-137)/(70-105) 124/84 (05/19 0804) SpO2:  [86 %-97 %] 97 % (05/19 0804) Weight:  [111.2 kg] 111.2 kg (05/19 0438)    Weight change: Filed Weights   05/10/22 1451 05/11/22 0413 05/12/22 0438  Weight: 110.8 kg 110 kg 111.2 kg    Intake/Output:   Intake/Output Summary (Last 24 hours) at 05/12/2022 0929 Last data filed at 05/12/2022 0817 Gross per 24 hour  Intake 539.84 ml  Output 800 ml  Net -260.16 ml      Physical Exam    General:  Well appearing. No resp difficulty HEENT: Normal Neck: Supple. No JVP . Carotids 2+ bilat; no bruits. No lymphadenopathy or thyromegaly appreciated. Cor: PMI nondisplaced. Regular rate & rhythm. No rubs, gallops or murmurs. Lungs: Clear Abdomen: Soft, nontender, nondistended. No hepatosplenomegaly. No bruits or masses. Good bowel sounds. Extremities: No cyanosis, clubbing, rash, edema Neuro: Alert & orientedx3, cranial nerves grossly intact. moves all 4 extremities w/o difficulty. Affect pleasant   Telemetry   NSR 80s, less frequent PVCs 2-10/min. 2 brief runs of NSVT 6-7 beats   EKG    N/a   Labs    CBC Recent Labs    05/09/22 1154 05/11/22 0724 05/11/22 1521 05/11/22 1524 05/11/22 1525  WBC 9.4 10.5   --   --   --   NEUTROABS  --  7.4  --   --   --   HGB 13.0 15.5   < > 15.0 16.3  HCT 40.9 48.4   < > 44.0 48.0  MCV 83.6 82.6  --   --   --   PLT 158 212  --   --   --    < > = values in this interval not displayed.   Basic Metabolic Panel Recent Labs    17/40/81 0724 05/11/22 1521 05/11/22 1525 05/12/22 0045  NA 136   < > 138 137  K 3.9   < > 3.4* 3.6  CL 101  --   --  103  CO2 26  --   --  26  GLUCOSE 117*  --   --  108*  BUN 16  --   --  19  CREATININE 1.24  --   --  1.15  CALCIUM 9.1  --   --  8.8*  MG 2.0  --   --   --    < > = values in this interval not displayed.   Liver Function Tests No results for input(s): AST, ALT, ALKPHOS, BILITOT, PROT, ALBUMIN in the last 72 hours. No results for input(s): LIPASE, AMYLASE in the last 72 hours. Cardiac Enzymes No results for input(s): CKTOTAL,  CKMB, CKMBINDEX, TROPONINI in the last 72 hours.  BNP: BNP (last 3 results) Recent Labs    05/09/22 1210  BNP 772.9*    ProBNP (last 3 results) No results for input(s): PROBNP in the last 8760 hours.   D-Dimer No results for input(s): DDIMER in the last 72 hours. Hemoglobin A1C Recent Labs    05/12/22 0045  HGBA1C 5.4   Fasting Lipid Panel Recent Labs    05/10/22 0334  CHOL 230*  HDL 35*  LDLCALC 153*  TRIG 208*  CHOLHDL 6.6   Thyroid Function Tests Recent Labs    05/10/22 0334  TSH 2.610    Other results:   Imaging    CARDIAC CATHETERIZATION  Result Date: 05/11/2022   Mid Cx lesion is 30% stenosed.   Mid LAD lesion is 20% stenosed. Findings: Ao = 83/65 (71) LV = 98/9 RA = 2 RV = 20/7 PA = 20/12 (15) PCW = 5 Fick cardiac output/index = 4.1/1.9 PVR = 2.4 WU SVR = 1,244 Ao sat = 95% PA sat = 60%, 61% Assessment: 1. Very mild non-obstructive CAD 2. Severe NICM EF ~25% 3. 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 Hold diuretics. Hydrate gently. Start mexilitene. cMRI tomorrow. Arvilla Meres, MD 3:47 PM    Medications:     Scheduled Medications:  atorvastatin  40 mg Oral Daily   carvedilol  3.125 mg Oral BID WC   dapagliflozin propanediol  10 mg Oral Daily   enoxaparin (LOVENOX) injection  40 mg Subcutaneous Q24H   fluticasone  1 spray Each Nare QHS   furosemide  40 mg Oral Daily   mexiletine  200 mg Oral BID   sacubitril-valsartan  1 tablet Oral BID   sodium chloride flush  3 mL Intravenous Q12H   sodium chloride flush  3 mL Intravenous Q12H   sodium chloride flush  3 mL Intravenous Q12H   spironolactone  12.5 mg Oral Daily    Infusions:  sodium chloride     sodium chloride      PRN Medications: sodium chloride, sodium chloride, acetaminophen, ondansetron (ZOFRAN) IV, sodium chloride flush, sodium chloride flush    Patient Profile   56 year old with h/o OA, S/P R and L hip replacement 2022, HTN, tobacco abuse, and HLD. Has family hx CHF in father.   Admitted with acute systolic CHF.    Assessment/Plan   1. Acute HFrEF -Echo EF 25-30%, RV okay. Unclear etiology. R/LHC with mild nonobstructive CAD, low filling pressures with moderate to severely reduced CO. 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  - cMRI today -TSH ok. HIV NR. BNP >700.  -Diuresed with IV lasix and now appears euvolemmic. Filling pressures low on cath. Stop lasix -Continue Entresto 49-51 mg bid  -Increase spiro to 25 mg daily  -Continue Coreg 3.125 mg bid  -Continue farxiga 10 mg daily.  - Will need sleep study set up at his outpatient follow up.    2. HTN  -Controlled with addition of entresto and spiro.  -GDMT titration per above    3. HLD -Continue atorvastin.    4. Tobacco Abuse -Discussed cessation.    5. PVCs/NSVT  -Multifocal PVCs.  -Improving w/ mexiletine, continue 200 mg bid  -Will need sleep study.   6. Non-obstructive CAD - very mild. - add statin/ASA  Length of Stay: 2  Robbie Lis, PA-C  05/12/2022, 9:29 AM  Advanced Heart Failure Team Pager  [phone] (M-F; 7a - 5p)  Please contact  [facility] for night-coverage after hours (5p -7a ) and weekends on [email or web address]  Patient seen and examined with the above-signed Advanced Practice Provider and/or Housestaff. I personally reviewed laboratory data, imaging studies and relevant notes. I independently examined the patient and formulated the important aspects of the plan. I have edited the note to reflect any of my changes or salient points. I have personally discussed the plan with the patient and/or family.  Cath results reviewed. PVCS improved with mexilitene. BP back up after cath. No CP or SOB  General:  Well appearing. No resp difficulty HEENT: normal Neck: supple. no JVD. Carotids 2+ bilat; no bruits. No lymphadenopathy or thryomegaly appreciated. Cor: PMI nondisplaced. Regular rate & rhythm. No rubs, gallops or murmurs. Lungs: clear Abdomen: soft, nontender, nondistended. No hepatosplenomegaly. No bruits or masses. Good bowel sounds. Extremities: no cyanosis, clubbing, rash, edema Neuro: alert & orientedx3, cranial nerves grossly intact. moves all 4 extremities w/o difficulty. 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 cMRI today. Titrate GDMT.   Possible home in am if PVCs improved. Start statin/ASA for mild CAD.   Arvilla Meres, MD  9:51 AM

## 2022-05-12 NOTE — TOC Benefit Eligibility Note (Signed)
Patient Scientific laboratory technician completed.     The patient is currently admitted and upon discharge could be taking MEXILETINE 200 MG.   The current 30 day co-pay is, $40.37.   The patient is insured through [employer].

## 2022-05-12 NOTE — Plan of Care (Signed)

## 2022-05-12 NOTE — TOC CM/SW Note (Signed)
HF TOC CM requested meds up from [facility] pharmacy today for possible dc home tomorrow. Isidoro Donning RN3 CCM, Heart Failure TOC CM [phone]

## 2022-05-12 NOTE — TOC Initial Note (Addendum)
Transition of Care [facility]) - Initial/Assessment Note    Patient Details  Name: Alan Coffey MRN: [id number] Date of Birth: 04-14-1966  Transition of Care [facility]) CM/SW Contact:    Elliot Cousin, RN Phone Number: [phone] 05/12/2022, 5:27 PM  Clinical Narrative:                 HF TOC CM spoke to pt and states his dtr will provide transportation home. Pt reports having scale at home. States he has been working on losing weight and eating heart healthy diet. Contacted pt's [facility]   Farxiga-has in stock Mexiletine-has in stock Entresto-does not have in stock, pt was provided 2 week samples from Dr [employer] from office.   Updated attending to send all Rx to pt's pharmacy.    Provided pt with [place] and Entresto 30 day free trial cards.   Expected Discharge Plan: Home/Self Care Barriers to Discharge: Continued Medical Work up   Patient Goals and CMS Choice Patient states their goals for this hospitalization and ongoing recovery are:: Wants to get better to go home CMS Medicare.gov Compare Post Acute Care list provided to::  (n/a)    Expected Discharge Plan and Services Expected Discharge Plan: Home/Self Care In-house Referral: NA Discharge Planning Services: CM Consult Post Acute Care Choice: NA Living arrangements for the past 2 months: Single Family Home                 DME Arranged: N/A DME Agency: NA       HH Arranged: NA HH Agency: NA        Prior Living Arrangements/Services Living arrangements for the past 2 months: Single Family Home Lives with:: Self Patient language and need for interpreter reviewed:: Yes Do you feel safe going back to the place where you live?: Yes      Need for Family Participation in Patient Care: No (Comment) Care giver support system in place?: Yes (comment) Current home services:  (n/a) Criminal Activity/Legal Involvement Pertinent to Current Situation/Hospitalization: No - Comment as needed  Activities of  Daily Living Home Assistive Devices/Equipment: None ADL Screening (condition at time of admission) Patient's cognitive ability adequate to safely complete daily activities?: Yes Is the patient deaf or have difficulty hearing?: No Does the patient have difficulty seeing, even when wearing glasses/contacts?: No Does the patient have difficulty concentrating, remembering, or making decisions?: No Patient able to express need for assistance with ADLs?: Yes Does the patient have difficulty dressing or bathing?: No Independently performs ADLs?: Yes (appropriate for developmental age) Does the patient have difficulty walking or climbing stairs?: No Weakness of Legs: None Weakness of Arms/Hands: None  Permission Sought/Granted Permission sought to share information with : Family Supports, PCP, Case Manager Permission granted to share information with : Yes, Verbal Permission Granted  Share Information with NAME: Jayshaun Phillips     Permission granted to share info w Relationship: daughter  Permission granted to share info w Contact Information: [phone]  Emotional Assessment Appearance:: Appears stated age Attitude/Demeanor/Rapport: Engaged Affect (typically observed): Accepting Orientation: : Oriented to Self, Oriented to Place, Oriented to  Time, Oriented to Situation Alcohol / Substance Use: Not Applicable Psych Involvement: No (comment)  Admission diagnosis:  NSTEMI (non-ST elevated myocardial infarction) (HCC) [I21.4] Heart failure (HCC) [I50.9] New onset of congestive heart failure (HCC) [I50.9] Acute congestive heart failure, unspecified heart failure type [facility]) [I50.9] Patient Active Problem List   Diagnosis Date Noted   Acute clinical systolic heart failure (HCC) 05/10/2022  HTN (hypertension) 05/10/2022   HLD (hyperlipidemia) 05/10/2022   Class 2 obesity 05/10/2022   Status post total replacement of right hip 10/14/2021   Status post left hip replacement 07/01/2021    Unilateral primary osteoarthritis, left hip 05/19/2021   Unilateral primary osteoarthritis, right hip 05/19/2021   Mass of soft tissue of left upper extremity 03/30/2020   PCP:  [name], MD Pharmacy:   [id number] - [place], [place] - [address] AT [facility] [address] & [facility] [address] [place] [address] Phone: [phone] Fax: [phone]  [facility] #[id number] [place], [place] - [address] AT [facility] [address] & [facility] [address] [place] [address] Phone: [phone] Fax: [phone]     Social Determinants of Health (SDOH) Interventions    Readmission Risk Interventions     View : No data to display.

## 2022-05-12 NOTE — Progress Notes (Signed)
  Progress Note   Patient: Alan Coffey [id number] DOB: [birth year] DOA: 05/09/2022     2 DOS: the patient was seen and examined on 05/12/2022   Brief hospital course: Mr. Barrientes was admitted to the hospital with the working diagnosis of decompensated heart failure  56 yo male with past medical history of hypertension and dyslipidemia who presented with dyspnea. Worsening symptoms over last 24 hr before admission, associated with lower extremity edema and non productive cough. Positive orthopnea. On his initial physical examination his blood pressure was 156/86, HR 76, RR 24, 02 saturation 96%, lungs with decreased breath sounds, heart with S1 and S2 present and rhythmic, abdomen not distended and trace lower extremity edema.  Chest radiograph with cardiomegaly and bilateral hilar vascular congestion, bilateral interstitial infiltrates, with cephalization of the vasculature.   EKG 100 bpm, normal axis, normal intervals, sinus rhythm, with poor R wave progression, no significant ST segment changes, negative T wave v5 and v6 with positive LVH.   Patient was placed on furosemide for diuresis.  Echocardiogram with reduced LV systolic function.  Cardiac catheterization with no significant coronary artery disease.  Positive Non sustained VT on telemetry monitoring. Patient has been started on carvedilol and mexiletine with good toleration.   Cardiac MRI today and possible dc home tomorrow.  Assessment and Plan: * Acute clinical systolic heart failure (HCC) Echocardiogram with reduced LV systolic function to 25 to 30%, internal cavity with mild dilatation. Mild LVH. Small pericardial effusion. No significant valvular disease.   Urine output 800 ml   Blood pressure systolic [id number] to A999333 mmHg.   Improved ectopy on telemetry monitoring.   Cardiac catheterization with PA 20/12 with mean 5, PCW 5, cardiac output 4.1 and index 1.9 per Fick.  Mid Cx lesion 30% stenosed Mid LAD lesion 20%  stenosed.    Plan to continue medical therapy with empagliflozin, carvedilol and mexiletine.  Spironolactone and Entresto.   For mild coronary artery disease on aspirin and statin therapy.   HTN (hypertension) Blood pressure has been stable, continue with carvedilol and entresto. Spironolactone.   HLD (hyperlipidemia) Elevated LDL 163.  Continue statin therapy.   Class 2 obesity Calculated BMI is 37,1        Subjective: Patient is feeling better, no chest pain, no dyspnea or lower extremity edema   Physical Exam: Vitals:   05/11/22 2349 05/12/22 0438 05/12/22 0804 05/12/22 1225  BP: (!) 132/97 (!) 131/94 124/84 (!) 142/89  Pulse: 77 81 60 80  Resp: 15 17 13    Temp: 97.6 F (36.4 C) 97.7 F (36.5 C) 97.7 F (36.5 C) (!) 97.5 F (36.4 C)  TempSrc: Oral Oral Oral Oral  SpO2: 95% 94% 97% 94%  Weight:  111.2 kg    Height:       Neurology awake and alert ENT with no pallor Cardiovascular with S1 and S2 present and rhythmic with no gallops, rubs or murmurs No JVD No lower extremity edema Respiratory with no rales or rhonchi Abdomen soft and not distended  Data Reviewed:    Family Communication: no family at the bedside   Disposition: Status is: Inpatient Remains inpatient appropriate because: heart failure   Planned Discharge Destination: Home   Author: Tawni Millers, MD 05/12/2022 2:56 PM  For on call review [email or web address].

## 2022-05-13 DIAGNOSIS — E669 Obesity, unspecified: Secondary | ICD-10-CM | POA: Diagnosis not present

## 2022-05-13 DIAGNOSIS — E876 Hypokalemia: Secondary | ICD-10-CM

## 2022-05-13 DIAGNOSIS — I10 Essential (primary) hypertension: Secondary | ICD-10-CM | POA: Diagnosis not present

## 2022-05-13 DIAGNOSIS — I5021 Acute systolic (congestive) heart failure: Secondary | ICD-10-CM | POA: Diagnosis not present

## 2022-05-13 DIAGNOSIS — E785 Hyperlipidemia, unspecified: Secondary | ICD-10-CM | POA: Diagnosis not present

## 2022-05-13 LAB — BASIC METABOLIC PANEL
Anion gap: 9 (ref 5–15)
BUN: 18 mg/dL (ref 6–20)
CO2: 23 mmol/L (ref 22–32)
Calcium: 8.8 mg/dL — ABNORMAL LOW (ref 8.9–10.3)
Chloride: 105 mmol/L (ref 98–111)
Creatinine, Ser: 1.07 mg/dL (ref 0.61–1.24)
GFR, Estimated: >60 mL/min (ref >60)
Glucose, Bld: 103 mg/dL — ABNORMAL HIGH (ref 70–99)
Potassium: 3.9 mmol/L (ref 3.5–5.1)
Sodium: 137 mmol/L (ref 135–145)

## 2022-05-13 LAB — LIPOPROTEIN A (LPA): Lipoprotein (a): 15 nmol/L (ref <75.0)

## 2022-05-13 LAB — MAGNESIUM: Magnesium: 2 mg/dL (ref 1.7–2.4)

## 2022-05-13 MED ORDER — MEXILETINE HCL 150 MG PO CAPS: 300.0000 mg | ORAL_CAPSULE | Freq: Two times a day (BID) | ORAL | 0 refills | Status: DC

## 2022-05-13 MED ORDER — ASPIRIN 81 MG PO TBEC: 81.0000 mg | DELAYED_RELEASE_TABLET | Freq: Every day | ORAL | 0 refills | Status: DC

## 2022-05-13 MED ORDER — CARVEDILOL 3.125 MG PO TABS
3.1250 mg | ORAL_TABLET | Freq: Two times a day (BID) | ORAL | 0 refills | Status: DC
Start: 2022-05-13 — End: 2022-06-07

## 2022-05-13 MED ORDER — FUROSEMIDE 20 MG PO TABS: 20.0000 mg | ORAL_TABLET | Freq: Every day | ORAL | 0 refills | Status: DC | PRN

## 2022-05-13 MED ORDER — FUROSEMIDE 20 MG PO TABS: 20.0000 mg | ORAL_TABLET | Freq: Every day | ORAL | Status: DC | PRN

## 2022-05-13 MED ORDER — ATORVASTATIN CALCIUM 40 MG PO TABS: 40.0000 mg | ORAL_TABLET | Freq: Every day | ORAL | 0 refills | Status: DC

## 2022-05-13 MED ORDER — SPIRONOLACTONE 25 MG PO TABS
25.0000 mg | ORAL_TABLET | Freq: Every day | ORAL | 0 refills | Status: DC
Start: 2022-05-14 — End: 2022-06-07

## 2022-05-13 MED ORDER — DAPAGLIFLOZIN PROPANEDIOL 10 MG PO TABS: 10.0000 mg | ORAL_TABLET | Freq: Every day | ORAL | 0 refills | Status: DC

## 2022-05-13 MED ORDER — SACUBITRIL-VALSARTAN 49-51 MG PO TABS: 1.0000 | ORAL_TABLET | Freq: Two times a day (BID) | ORAL | 0 refills | Status: DC

## 2022-05-13 NOTE — Assessment & Plan Note (Signed)
Renal function remained stable and tolerated well diuresis. K was corrected with KCl  At the time of his discharge renal function with serum cr at 1.0 with K at 3,9 and serum bicarbonate at 23.   Follow up renal function as outpatient.

## 2022-05-13 NOTE — Progress Notes (Signed)
Patient ID: Alan Coffey, male   DOB: 1966-10-26, 56 y.o.   MRN: [id number]     Advanced Heart Failure Rounding Note  PCP-Cardiologist: None   Subjective:    R/L cath yesterday. Minimal CAD  Ao = 83/65 (71) LV = 98/9 RA = 2 RV = 20/7 PA = 20/12 (15) PCW = 5 Fick cardiac output/index = 4.1/1.9 PVR = 2.4 WU SVR = 1,244 Ao sat = 95% PA sat = 60%, 61%  Cardiac MRI: LVEF 21%, RVEF 35%, mid-myocardial LGE basal septum and mid inferior wall.   No dyspnea, SBP 90s-120s.  Denies lightheadedness.    Objective:   Weight Range: 110.6 kg Body mass index is 37.07 kg/m.   Vital Signs:   Temp:  [97.5 F (36.4 C)-98.4 F (36.9 C)] 97.9 F (36.6 C) (05/20 0807) Pulse Rate:  [60-88] 60 (05/20 0807) Resp:  [15-17] 15 (05/20 0807) BP: (96-142)/(44-89) 96/44 (05/20 0807) SpO2:  [94 %-100 %] 96 % (05/20 0807) Weight:  [110.6 kg] 110.6 kg (05/20 0500) Last BM Date : 05/12/22  Weight change: Filed Weights   05/11/22 0413 05/12/22 0438 05/13/22 0500  Weight: 110 kg 111.2 kg 110.6 kg    Intake/Output:   Intake/Output Summary (Last 24 hours) at 05/13/2022 1053 Last data filed at 05/13/2022 0811 Gross per 24 hour  Intake 920 ml  Output 400 ml  Net 520 ml      Physical Exam    General: NAD Neck: No JVD, no thyromegaly or thyroid nodule.  Lungs: Clear to auscultation bilaterally with normal respiratory effort. CV: Nonpalpable PMI.  Heart regular S1/S2, no S3/S4, no murmur.  No peripheral edema.   Abdomen: Soft, nontender, no hepatosplenomegaly, no distention.  Skin: Intact without lesions or rashes.  Neurologic: Alert and oriented x 3.  Psych: Normal affect. Extremities: No clubbing or cyanosis.  HEENT: Normal.   Telemetry   NSR 80s, rare PVCs, no NSVT (personally reviewed)  EKG    N/a   Labs    CBC Recent Labs    05/11/22 0724 05/11/22 1521 05/11/22 1524 05/11/22 1525  WBC 10.5  --   --   --   NEUTROABS 7.4  --   --   --   HGB 15.5   < > 15.0 16.3   HCT 48.4   < > 44.0 48.0  MCV 82.6  --   --   --   PLT 212  --   --   --    < > = values in this interval not displayed.   Basic Metabolic Panel Recent Labs    05/11/22 0724 05/11/22 1521 05/12/22 0045 05/13/22 0034  NA 136   < > 137 137  K 3.9   < > 3.6 3.9  CL 101  --  103 105  CO2 26  --  26 23  GLUCOSE 117*  --  108* 103*  BUN 16  --  19 18  CREATININE 1.24  --  1.15 1.07  CALCIUM 9.1  --  8.8* 8.8*  MG 2.0  --   --  2.0   < > = values in this interval not displayed.   Liver Function Tests No results for input(s): AST, ALT, ALKPHOS, BILITOT, PROT, ALBUMIN in the last 72 hours. No results for input(s): LIPASE, AMYLASE in the last 72 hours. Cardiac Enzymes No results for input(s): CKTOTAL, CKMB, CKMBINDEX, TROPONINI in the last 72 hours.  BNP: BNP (last 3 results) Recent Labs    05/09/22 1210  BNP 772.9*    ProBNP (last 3 results) No results for input(s): PROBNP in the last 8760 hours.   D-Dimer No results for input(s): DDIMER in the last 72 hours. Hemoglobin A1C Recent Labs    05/12/22 0045  HGBA1C 5.4   Fasting Lipid Panel No results for input(s): CHOL, HDL, LDLCALC, TRIG, CHOLHDL, LDLDIRECT in the last 72 hours.  Thyroid Function Tests No results for input(s): TSH, T4TOTAL, T3FREE, THYROIDAB in the last 72 hours.  Invalid input(s): FREET3   Other results:   Imaging    MR CARDIAC MORPHOLOGY W WO CONTRAST  Result Date: 05/12/2022 CLINICAL DATA:  Clinical question of Cardiomyopathy Study assumes HCT of 48 and BSA of 2.31. EXAM: CARDIAC MRI TECHNIQUE: The patient was scanned on a 1.5 Tesla GE magnet. A dedicated cardiac coil was used. Functional imaging was done using Fiesta sequences. 2,3, and 4 chamber views were done to assess for RWMA's. Modified Simpson's rule using a short axis stack was used to calculate an ejection fraction on a dedicated work Conservation officer, nature. The patient received 10 cc of Gadavist. After 10 minutes inversion  recovery sequences were used to assess for infiltration and scar tissue. VENC assessment of the aortic valve. CONTRAST:  10 cc  of Gadavist FINDINGS: 1. Moderately increased left ventricular size, with LVEDD 69 mm, but LVEDVi 124 mL/m2. Mild concentric hypertrophy, with intraventricular septal thickness of 8 mm, posterior wall thickness of 7 mm, but myocardial mass index of 89 g/m2. Severe left ventricular systolic dysfunction (LVEF =21%). Paradoxical septal motion and global hypokinesis. No LV thrombus. GLS -4.8% Left ventricular parametric mapping notable for normal T2. ECV is elevated in the mid inferoseptal (43%) and severely elevated in the basal inferoseptal (63%). There is late gadolinium enhancement in the left ventricular myocardium: There is mid myocardial and patch LGE in the mid inferior, and basal septal (anterior and inferior, predominantly mid myocardial but transmural in the basal anteroseptal). 2. Normal right ventricular size with RVEDVI 83 mL/m2. Normal right ventricular thickness. Moderately decrease right ventricular systolic function (RVEF [id number]). Septal hypokinesis. 3.  Normal left and right atrial size. 4. Normal size of the aortic root, ascending aorta and pulmonary artery. 5. Valve assessment: Aortic Valve: Tri-leaflet aortic valve. Mild aortic regurgitation, regurgitation fraction 3%. Mean gradient 2 mm Hg. Pulmonic Valve: Qualitatively, there is no significant regurgitation. Tricuspid Valve: Qualitatively, there is mild central regurgitation. Mitral Valve: Mild to moderate ventricular functional central mitral regurgitation. Regurgitant volume 12 mL, regurgitant fraction 21%. 6.  Normal pericardium.  No pericardial effusion. 7. Grossly, no extracardiac findings. Recommended dedicated study if concerned for non-cardiac pathology. 8. Patient motion artifacts noted. This decreased the sensitivity of ventricular volumes and valve assessment. IMPRESSION: Severely reduced LV function 21% Mild to  moderate mitral regurgitation. LGE is septal and occasional epicardial without RV free wall involvement and without being truly multi-focal. 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 Low threshold for PET evaluation. Rudean Haskell MD Electronically Signed   By: Rudean Haskell M.D.   On: 05/12/2022 14:32     Medications:     Scheduled Medications:  aspirin EC  81 mg Oral Daily   atorvastatin  40 mg Oral Daily   carvedilol  3.125 mg Oral BID WC   dapagliflozin propanediol  10 mg Oral Daily   enoxaparin (LOVENOX) injection  40 mg Subcutaneous Q24H   fluticasone  1 spray Each Nare QHS   mexiletine  300 mg Oral BID   sacubitril-valsartan  1 tablet Oral BID   sodium chloride flush  3 mL Intravenous Q12H   sodium chloride flush  3 mL Intravenous Q12H   sodium chloride flush  3 mL Intravenous Q12H   spironolactone  25 mg Oral Daily    Infusions:  sodium chloride     sodium chloride      PRN Medications: sodium chloride, sodium chloride, acetaminophen, ondansetron (ZOFRAN) IV, sodium chloride flush, sodium chloride flush    Patient Profile   56 year old with h/o OA, S/P R and L hip replacement 2022, HTN, tobacco abuse, and HLD. Has family hx CHF in father.   Admitted with acute systolic CHF.    Assessment/Plan   1. Acute HFrEF -Echo EF 25-30%, RV okay. Nonischemic cardiomyopathy. R/LHC with mild nonobstructive CAD, low filling pressures with moderate to severely reduced CO.  - Cardiac MRI: LVEF 21%, RVEF 35%, mid-myocardial LGE basal septum and mid inferior wall.  - Based on MRI, possible prior viral myocarditis, less likely cardiac sarcoidosis.  Would consider cPET/CT chest as outpatient.  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  -TSH ok. HIV NR. BNP >700.  -Diuresed with IV lasix and now appears euvolemic. Filling pressures low on cath. Can stay off Lasix for now.   -Continue Entresto 49-51 mg bid  -Continue spironolactone 25 mg daily  -Continue Coreg 3.125 mg bid  -Continue farxiga 10 mg daily.  - Will need sleep study set up at his outpatient follow up.    2. HTN  -BP controlled.     3. HLD -Continue atorvastin.    4. Tobacco Abuse -Discussed cessation.    5. PVCs/NSVT  -Multifocal PVCs.  -Improving w/ mexiletine, continue 200 mg bid  -Will need sleep study.   6. Non-obstructive CAD - very mild. - added statin/ASA  I think he can go home today, will need close followup in CHF clinic.  Cardiac meds for home: mexiletine 300 mg bid, Entresto 49/51 bid, dapagliflozin 10 mg daily, spironolactone 25 daily, Coreg 3.125 mg bid, ASA 81 daily, atorvastatin 40 daily.   Length of Stay: 3  Loralie Champagne, MD  05/13/2022, 10:53 AM  Advanced Heart Failure Team Pager [phone] (M-F; 7a - 5p)  Please contact [facility] for night-coverage after hours (5p -7a ) and weekends on [email or web address]

## 2022-05-13 NOTE — Discharge Summary (Signed)
Physician Discharge Summary   Patient: Alan Coffey MRN: [id number] DOB: 03/06/1966  Admit date:     05/09/2022  Discharge date: 05/13/22  Discharge Physician: Coralie Keens   PCP: [name], MD   Recommendations at discharge:    Patient  has been placed on guideline directed medical therapy for hear failure with carvedilol, entresto, spironolactone and dapagliflozin.  Holding no loop diuretic for now Plan to follow up with heart failure clinic as outpatient.  Furosemide as needed for signs of volume overload, dyspnea, edema or weight gain.   Discharge Diagnoses: Principal Problem:   Acute clinical systolic heart failure (HCC) Active Problems:   HTN (hypertension)   HLD (hyperlipidemia)   Hypokalemia   Class 2 obesity  Resolved Problems:   * No resolved hospital problems. [facility] Course: Mr. See was admitted to the hospital with the working diagnosis of decompensated heart failure  56 yo male with past medical history of hypertension and dyslipidemia who presented with dyspnea. Worsening symptoms over last 24 hr before admission, associated with lower extremity edema and non productive cough. Positive orthopnea. On his initial physical examination his blood pressure was 156/86, HR 76, RR 24, 02 saturation 96%, lungs with decreased breath sounds, heart with S1 and S2 present and rhythmic, abdomen not distended and trace lower extremity edema.  Chest radiograph with cardiomegaly and bilateral hilar vascular congestion, bilateral interstitial infiltrates, with cephalization of the vasculature.   EKG 100 bpm, normal axis, normal intervals, sinus rhythm, with poor R wave progression, no significant ST segment changes, negative T wave v5 and v6 with positive LVH.   Patient was placed on furosemide for diuresis.  Echocardiogram with reduced LV systolic function.  Cardiac catheterization with no significant coronary artery disease.  Positive Non sustained VT  on telemetry monitoring. Patient has been started on carvedilol and mexiletine with good toleration.   Cardiac MRI with LGE septal and occasional epicardial without RV free wall involvement and without multi focal. Low threshold for PET evaluation.   Assessment and Plan: * Acute clinical systolic heart failure (HCC) Patient was admitted to the cardiac unit and placed on aggressive diuresis with IV furosemide, negative fluid balanced was achieved, -4,012 ml, with significant improvement in his symptoms.    Echocardiogram with reduced LV systolic function to 25 to 30%, internal cavity with mild dilatation. Mild LVH. Small pericardial effusion. No significant valvular disease.   Cardiac catheterization with PA 20/12 with mean 5, PCW 5, cardiac output 4.1 and index 1.9 per Fick.  Mid Cx lesion 30% stenosed Mid LAD lesion 20% stenosed.   Cardiac MRI with reported with LGE septal and occasional epicardial without RV free wall involvement and without being truly multi-focal, low threshold for PET scan evaluation.    Medical therapy with empagliflozin, entresto, spironolactone and carvedilol  Diuresis with furosemide.   Positive PVC and non sustained ventricular tachycardia, started on mexiletine with good toleration.   For mild coronary artery disease on aspirin and statin therapy.     HTN (hypertension) Continue blood pressure control with carvedilol and entresto Diuresis with spironolactone and furosemide.   HLD (hyperlipidemia) Elevated LDL 163.  Continue statin therapy.   Hypokalemia Renal function remained stable and tolerated well diuresis. K was corrected with KCl  At the time of his discharge renal function with serum cr at 1.0 with K at 3,9 and serum bicarbonate at 23.   Follow up renal function as outpatient.   Class 2 obesity Calculated BMI is 37,1  Consultants: cardiology  Procedures performed: cardiac cathterization   Disposition: Home Diet  recommendation:  Discharge Diet Orders (From admission, onward)     Start     Ordered   05/13/22 0000  Diet - low sodium heart healthy        05/13/22 1140           Cardiac diet DISCHARGE MEDICATION: Allergies as of 05/13/2022   No Known Allergies      Medication List     STOP taking these medications    amLODipine 5 MG tablet Commonly known as: NORVASC       TAKE these medications    acetaminophen 325 MG tablet Commonly known as: TYLENOL Take 325 mg by mouth every 6 (six) hours as needed for moderate pain or headache.   ALZAIR ALLERGY NASAL SPRAY NA Place 1 spray into both nostrils at bedtime.   aspirin EC 81 MG tablet Take 1 tablet (81 mg total) by mouth daily. Swallow whole. Start taking on: May 14, 2022   atorvastatin 40 MG tablet Commonly known as: LIPITOR Take 1 tablet (40 mg total) by mouth daily. Start taking on: May 14, 2022   carvedilol 3.125 MG tablet Commonly known as: COREG Take 1 tablet (3.125 mg total) by mouth 2 (two) times daily with a meal.   dapagliflozin propanediol 10 MG Tabs tablet Commonly known as: FARXIGA Take 1 tablet (10 mg total) by mouth daily. Start taking on: May 14, 2022   diphenhydrAMINE 25 MG tablet Commonly known as: BENADRYL Take 25 mg by mouth every 6 (six) hours as needed for allergies.   furosemide 20 MG tablet Commonly known as: LASIX Take 1 tablet (20 mg total) by mouth daily as needed for edema or fluid (as needed for shortness of breath, leg swelling or weight gain 3 lbs in 24 hrs or 5 lbs in 7 days.).   mexiletine 150 MG capsule Commonly known as: MEXITIL Take 2 capsules (300 mg total) by mouth 2 (two) times daily.   sacubitril-valsartan 49-51 MG Commonly known as: ENTRESTO Take 1 tablet by mouth 2 (two) times daily.   spironolactone 25 MG tablet Commonly known as: ALDACTONE Take 1 tablet (25 mg total) by mouth daily. Start taking on: May 14, 2022        Follow-up Information     [facility] Follow up on 05/31/2022.   Specialty: Cardiology Why: [facility] 10:30 am Entrance C, Free Valet Parking Contact information: [address] [id number] mc [place] [address] [phone]               Discharge Exam: Filed Weights   05/11/22 0413 05/12/22 0438 05/13/22 0500  Weight: 110 kg 111.2 kg 110.6 kg   BP (!) 96/44 (BP Location: Left Arm)   Pulse 60   Temp 97.9 F (36.6 C) (Oral)   Resp 15   Ht 5\' 8"  (1.727 m)   Wt 110.6 kg   SpO2 96%   BMI 37.07 kg/m   Patient is feeling better, with no dyspnea or chest pain  Neurology awake and alert ENT with no pallor Cardiovascular with S1 and S2 present and rhythmic with on gallops, rubs or murmurs Respiratory with no rales or wheezing Abdomen soft and non tender No lower extremity edema   Condition at discharge: stable  The results of significant diagnostics from this hospitalization (including imaging, microbiology, ancillary and laboratory) are listed below for reference.   Imaging  Studies: DG Chest 2 View  Result Date: 05/09/2022 CLINICAL DATA:  SOB and right lower extremity swelling x3 days. Pt states he has HTN and has been prescribed meds, but he has not been able to take them in APPROX.2 months. Smoker. EXAM: CHEST - 2 VIEW COMPARISON:  09/07/2007 FINDINGS: Coarse perihilar and bibasilar interstitial opacities, new since previous. There is some patchy subsegmental atelectasis or scarring in the lung bases. Heart size and mediastinal contours are within normal limits. No effusion. Visualized bones unremarkable. IMPRESSION: Mild perihilar and bibasilar interstitial edema or infiltrates, new since previous. Electronically Signed   By: Corlis Leak M.D.   On: 05/09/2022 12:13   CARDIAC CATHETERIZATION  Result Date: 05/11/2022   Mid Cx lesion is 30% stenosed.   Mid LAD lesion is 20% stenosed. Findings: Ao = 83/65 (71) LV = 98/9 RA = 2 RV  = 20/7 PA = 20/12 (15) PCW = 5 Fick cardiac output/index = 4.1/1.9 PVR = 2.4 WU SVR = 1,244 Ao sat = 95% PA sat = 60%, 61% Assessment: 1. Very mild non-obstructive CAD 2. Severe NICM EF ~25% 3. 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 Hold diuretics. Hydrate gently. Start mexilitene. cMRI tomorrow. Arvilla Meres, MD 3:47 PM  MR CARDIAC MORPHOLOGY W WO CONTRAST  Result Date: 05/12/2022 CLINICAL DATA:  Clinical question of Cardiomyopathy Study assumes HCT of 48 and BSA of 2.31. EXAM: CARDIAC MRI TECHNIQUE: The patient was scanned on a 1.5 Tesla GE magnet. A dedicated cardiac coil was used. Functional imaging was done using Fiesta sequences. 2,3, and 4 chamber views were done to assess for RWMA's. Modified Simpson's rule using a short axis stack was used to calculate an ejection fraction on a dedicated work [employer]. The patient received 10 cc of Gadavist. After 10 minutes inversion recovery sequences were used to assess for infiltration and scar tissue. VENC assessment of the aortic valve. CONTRAST:  10 cc  of Gadavist FINDINGS: 1. Moderately increased left ventricular size, with LVEDD 69 mm, but LVEDVi 124 mL/m2. Mild concentric hypertrophy, with intraventricular septal thickness of 8 mm, posterior wall thickness of 7 mm, but myocardial mass index of 89 g/m2. Severe left ventricular systolic dysfunction (LVEF =21%). Paradoxical septal motion and global hypokinesis. No LV thrombus. GLS -4.8% Left ventricular parametric mapping notable for normal T2. ECV is elevated in the mid inferoseptal (43%) and severely elevated in the basal inferoseptal (63%). There is late gadolinium enhancement in the left ventricular myocardium: There is mid myocardial and patch LGE in the mid inferior, and basal septal (anterior and inferior, predominantly mid myocardial but transmural in the basal anteroseptal). 2. Normal right ventricular  size with RVEDVI 83 mL/m2. Normal right ventricular thickness. Moderately decrease right ventricular systolic function (RVEF =35%). Septal hypokinesis. 3.  Normal left and right atrial size. 4. Normal size of the aortic root, ascending aorta and pulmonary artery. 5. Valve assessment: Aortic Valve: Tri-leaflet aortic valve. Mild aortic regurgitation, regurgitation fraction 3%. Mean gradient 2 mm Hg. Pulmonic Valve: Qualitatively, there is no significant regurgitation. Tricuspid Valve: Qualitatively, there is mild central regurgitation. Mitral Valve: Mild to moderate ventricular functional central mitral regurgitation. Regurgitant volume 12 mL, regurgitant fraction 21%. 6.  Normal pericardium.  No pericardial effusion. 7. Grossly, no extracardiac findings. Recommended dedicated study if concerned for non-cardiac pathology. 8. Patient motion artifacts noted. This decreased the sensitivity of ventricular volumes and valve assessment. IMPRESSION: Severely reduced LV function 21% Mild to moderate mitral  regurgitation. LGE is septal and occasional epicardial without RV free wall involvement and without being truly multi-focal. 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 Low threshold for PET evaluation. Riley Lam MD Electronically Signed   By: Riley Lam M.D.   On: 05/12/2022 14:32   ECHOCARDIOGRAM COMPLETE  Result Date: 05/10/2022    ECHOCARDIOGRAM REPORT   Patient Name:   RASHAN ARNER Date of Exam: 05/10/2022 Medical Rec #:  [id number]           Height:       69.0 in Accession #:    [id number]          Weight:       259.4 lb Date of Birth:  [birth year]           BSA:          2.307 m Patient Age:    55 years            BP:           161/123 mmHg Patient Gender: M                   HR:           78 bpm. Exam Location:  Inpatient Procedure: 2D Echo, Cardiac Doppler, Color Doppler and Intracardiac            Opacification Agent Indications:    Congestive Heart Failure I50.9  History:         Patient has no prior history of Echocardiogram examinations.                 Risk Factors:Hypertension.  Sonographer:    Eulah Pont RDCS Referring Phys: [id number] [facility] A GRAY  Sonographer Comments: Image acquisition challenging due to respiratory motion. IMPRESSIONS  1. Severe global hypokinesis with near akinesis of the inferior wall. Left ventricular ejection fraction, by estimation, is 25 to 30%. The left ventricle has severely decreased function. The left ventricle demonstrates regional wall motion abnormalities  (see scoring diagram/findings for description). The left ventricular internal cavity size was mildly to moderately dilated. There is mild concentric left ventricular hypertrophy. Left ventricular diastolic parameters are consistent with Grade II diastolic dysfunction (pseudonormalization).  2. Right ventricular systolic function is normal. The right ventricular size is normal.  3. Left atrial size was moderately dilated.  4. A small pericardial effusion is present.  5. The mitral valve is normal in structure. Trivial mitral valve regurgitation. No evidence of mitral stenosis.  6. The aortic valve is tricuspid. Aortic valve regurgitation is not visualized. No aortic stenosis is present.  7. Aortic dilatation noted. There is mild dilatation of the ascending aorta, measuring 41 mm.  8. The inferior vena cava is normal in size with greater than 50% respiratory variability, suggesting right atrial pressure of 3 mmHg. Comparison(s): No prior Echocardiogram. Conclusion(s)/Recommendation(s): No left ventricular mural or apical thrombus/thrombi. Reduced LVEF of 25-30%. Findings communicated to primary team. FINDINGS  Left Ventricle: Severe global hypokinesis with near akinesis of the inferior wall. Left ventricular ejection fraction, by estimation, is 25 to 30%. The left ventricle has severely decreased function. The left ventricle demonstrates regional wall motion abnormalities. Definity contrast agent  was given IV to delineate the left ventricular endocardial borders. The left ventricular internal cavity size was mildly to moderately dilated. There is mild concentric left ventricular hypertrophy. Left ventricular  diastolic parameters are consistent with Grade II diastolic dysfunction (pseudonormalization). Right Ventricle: The right ventricular size is normal.  No increase in right ventricular wall thickness. Right ventricular systolic function is normal. Left Atrium: Left atrial size was moderately dilated. Right Atrium: Right atrial size was normal in size. Pericardium: A small pericardial effusion is present. Mitral Valve: The mitral valve is normal in structure. Trivial mitral valve regurgitation. No evidence of mitral valve stenosis. Tricuspid Valve: The tricuspid valve is normal in structure. Tricuspid valve regurgitation is trivial. No evidence of tricuspid stenosis. Aortic Valve: The aortic valve is tricuspid. Aortic valve regurgitation is not visualized. No aortic stenosis is present. Pulmonic Valve: The pulmonic valve was grossly normal. Pulmonic valve regurgitation is trivial. No evidence of pulmonic stenosis. Aorta: Aortic dilatation noted. There is mild dilatation of the ascending aorta, measuring 41 mm. Venous: The inferior vena cava is normal in size with greater than 50% respiratory variability, suggesting right atrial pressure of 3 mmHg. IAS/Shunts: The interatrial septum was not well visualized.  LEFT VENTRICLE PLAX 2D LVIDd:         5.70 cm      Diastology LVIDs:         5.10 cm      LV e' medial:    4.62 cm/s LV PW:         1.20 cm      LV E/e' medial:  15.3 LV IVS:        1.20 cm      LV e' lateral:   6.57 cm/s LVOT diam:     2.20 cm      LV E/e' lateral: 10.7 LV SV:         51 LV SV Index:   22 LVOT Area:     3.80 cm  LV Volumes (MOD) LV vol d, MOD A2C: 162.0 ml LV vol d, MOD A4C: 192.0 ml LV vol s, MOD A2C: 111.0 ml LV vol s, MOD A4C: 123.0 ml LV SV MOD A2C:     51.0 ml LV SV MOD A4C:      192.0 ml LV SV MOD BP:      65.8 ml RIGHT VENTRICLE RV S prime:     10.90 cm/s TAPSE (M-mode): 2.4 cm LEFT ATRIUM             Index        RIGHT ATRIUM           Index LA diam:        4.30 cm 1.86 cm/m   RA Area:     16.20 cm LA Vol (A2C):   90.7 ml 39.31 ml/m  RA Volume:   39.50 ml  17.12 ml/m LA Vol (A4C):   87.3 ml 37.83 ml/m LA Biplane Vol: 90.1 ml 39.05 ml/m  AORTIC VALVE LVOT Vmax:   79.40 cm/s LVOT Vmean:  51.800 cm/s LVOT VTI:    0.134 m  AORTA Ao Root diam: 3.80 cm Ao Asc diam:  4.10 cm MITRAL VALVE MV Area (PHT): 4.49 cm    SHUNTS MV Decel Time: 169 msec    Systemic VTI:  0.13 m MV E velocity: 70.60 cm/s  Systemic Diam: 2.20 cm MV A velocity: 51.40 cm/s MV E/A ratio:  1.37 Jodelle Red MD Electronically signed by Jodelle Red MD Signature Date/Time: 05/10/2022/5:35:31 PM    Final     Microbiology: Results for orders placed or performed during the hospital encounter of 05/09/22  Resp Panel by RT-PCR (Flu A&B, Covid) Nasopharyngeal Swab     Status: None   Collection Time: 05/09/22  6:09 PM   Specimen:  Nasopharyngeal Swab; Nasopharyngeal(NP) swabs in vial transport medium  Result Value Ref Range Status   SARS Coronavirus 2 by RT PCR NEGATIVE NEGATIVE Final    Comment: (NOTE) SARS-CoV-2 target nucleic acids are NOT DETECTED.  The SARS-CoV-2 RNA is generally detectable in upper respiratory specimens during the acute phase of infection. The lowest concentration of SARS-CoV-2 viral copies this assay can detect is 138 copies/mL. A negative result does not preclude SARS-Cov-2 infection and should not be used as the sole basis for treatment or other patient management decisions. A negative result may occur with  improper specimen collection/handling, submission of specimen other than nasopharyngeal swab, presence of viral mutation(s) within the areas targeted by this assay, and inadequate number of viral copies(<138 copies/mL). A negative result must be combined  with clinical observations, patient history, and epidemiological information. The expected result is Negative.  Fact Sheet for Patients:  BloggerCourse.com  Fact Sheet for Healthcare Providers:  SeriousBroker.it  This test is no t yet approved or cleared by the [place] FDA and  has been authorized for detection and/or diagnosis of SARS-CoV-2 by FDA under an Emergency Use Authorization (EUA). This EUA will remain  in effect (meaning this test can be used) for the duration of the COVID-19 declaration under Section 564(b)(1) of the Act, 21 U.S.C.section 360bbb-3(b)(1), unless the authorization is terminated  or revoked sooner.       Influenza A by PCR NEGATIVE NEGATIVE Final   Influenza B by PCR NEGATIVE NEGATIVE Final    Comment: (NOTE) The Xpert Xpress SARS-CoV-2/FLU/RSV plus assay is intended as an aid in the diagnosis of influenza from Nasopharyngeal swab specimens and should not be used as a sole basis for treatment. Nasal washings and aspirates are unacceptable for Xpert Xpress SARS-CoV-2/FLU/RSV testing.  Fact Sheet for Patients: BloggerCourse.com  Fact Sheet for Healthcare Providers: SeriousBroker.it  This test is not yet approved or cleared by the [place] FDA and has been authorized for detection and/or diagnosis of SARS-CoV-2 by FDA under an Emergency Use Authorization (EUA). This EUA will remain in effect (meaning this test can be used) for the duration of the COVID-19 declaration under Section 564(b)(1) of the Act, 21 U.S.C. section 360bbb-3(b)(1), unless the authorization is terminated or revoked.  Performed at [facility], [address], [place], [place] [address]   MRSA Next Gen by PCR, Nasal     Status: None   Collection Time: 05/10/22  2:46 PM   Specimen: Nasal Mucosa; Nasal Swab  Result Value Ref Range Status   MRSA by PCR Next Gen NOT  DETECTED NOT DETECTED Final    Comment: (NOTE) The GeneXpert MRSA Assay (FDA approved for NASAL specimens only), is one component of a comprehensive MRSA colonization surveillance program. It is not intended to diagnose MRSA infection nor to guide or monitor treatment for MRSA infections. Test performance is not FDA approved in patients less than 65 years old. Performed at [facility], [address] [place], [place] [address]     Labs: CBC: Recent Labs  Lab 05/09/22 1154 05/11/22 0724 05/11/22 1521 05/11/22 1524 05/11/22 1525  WBC 9.4 10.5  --   --   --   NEUTROABS  --  7.4  --   --   --   HGB 13.0 15.5 16.0 15.0 16.3  HCT 40.9 48.4 47.0 44.0 48.0  MCV 83.6 82.6  --   --   --   PLT 158 212  --   --   --  Basic Metabolic Panel: Recent Labs  Lab 05/09/22 1154 05/10/22 1747 05/11/22 0724 05/11/22 1521 05/11/22 1524 05/11/22 1525 05/12/22 0045 05/13/22 0034  NA 140 133* 136 139 141 138 137 137  K 3.6 3.7 3.9 3.2* 3.0* 3.4* 3.6 3.9  CL 107 101 101  --   --   --  103 105  CO2 26 25 26   --   --   --  26 23  GLUCOSE 122* 90 117*  --   --   --  108* 103*  BUN 10 14 16   --   --   --  19 18  CREATININE 0.98 1.20 1.24  --   --   --  1.15 1.07  CALCIUM 8.8* 8.9 9.1  --   --   --  8.8* 8.8*  MG  --   --  2.0  --   --   --   --  2.0   Liver Function Tests: No results for input(s): AST, ALT, ALKPHOS, BILITOT, PROT, ALBUMIN in the last 168 hours. CBG: No results for input(s): GLUCAP in the last 168 hours.  Discharge time spent: greater than 30 minutes.  Signed: , MD [facility] 05/13/2022

## 2022-05-25 ENCOUNTER — Ambulatory Visit (INDEPENDENT_AMBULATORY_CARE_PROVIDER_SITE_OTHER): Payer: 59

## 2022-05-25 ENCOUNTER — Ambulatory Visit: Payer: 59 | Admitting: Orthopaedic Surgery

## 2022-05-25 ENCOUNTER — Encounter: Payer: Self-pay | Admitting: Orthopaedic Surgery

## 2022-05-25 DIAGNOSIS — Z96643 Presence of artificial hip joint, bilateral: Secondary | ICD-10-CM | POA: Diagnosis not present

## 2022-05-25 NOTE — Progress Notes (Signed)
The patient is someone who we replaced both of his hips last year about 3 months apart.  The left hip was replaced in July and the right hip replaced in October.  These were both done through an anterior approach.  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  He now states that he is doing excellent overall and he has no pain.  He reports good strength and he has good posture and no back issues either.  He denies any pain with either hip.  His leg lengths are equal.  Both hips move smoothly and fluidly with no difficulty at all.  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  At this point follow-up can be as needed for his hips.  He understands if there is any issues at all he should not hesitate to let us know.  All questions and concerns were answered and addressed.

## 2022-05-31 ENCOUNTER — Other Ambulatory Visit (HOSPITAL_COMMUNITY): Payer: Self-pay

## 2022-05-31 ENCOUNTER — Encounter (HOSPITAL_COMMUNITY): Payer: Self-pay

## 2022-05-31 ENCOUNTER — Ambulatory Visit (HOSPITAL_COMMUNITY)
Admit: 2022-05-31 | Discharge: 2022-05-31 | Disposition: A | Discharge status: Home / Self Care | Payer: 59 | Attending: Family Medicine | Admitting: Family Medicine

## 2022-05-31 VITALS — BP 124/70 | HR 85 | Wt 252.0 lb

## 2022-05-31 DIAGNOSIS — Z87891 Personal history of nicotine dependence: Secondary | ICD-10-CM | POA: Insufficient documentation

## 2022-05-31 DIAGNOSIS — Z720 Tobacco use: Secondary | ICD-10-CM | POA: Diagnosis not present

## 2022-05-31 DIAGNOSIS — I472 Ventricular tachycardia, unspecified: Secondary | ICD-10-CM | POA: Insufficient documentation

## 2022-05-31 DIAGNOSIS — I110 Hypertensive heart disease with heart failure: Secondary | ICD-10-CM | POA: Diagnosis present

## 2022-05-31 DIAGNOSIS — E785 Hyperlipidemia, unspecified: Secondary | ICD-10-CM | POA: Diagnosis not present

## 2022-05-31 DIAGNOSIS — Z7984 Long term (current) use of oral hypoglycemic drugs: Secondary | ICD-10-CM | POA: Diagnosis not present

## 2022-05-31 DIAGNOSIS — I428 Other cardiomyopathies: Secondary | ICD-10-CM | POA: Diagnosis not present

## 2022-05-31 DIAGNOSIS — Z7982 Long term (current) use of aspirin: Secondary | ICD-10-CM | POA: Diagnosis not present

## 2022-05-31 DIAGNOSIS — I10 Essential (primary) hypertension: Secondary | ICD-10-CM

## 2022-05-31 DIAGNOSIS — I2510 Atherosclerotic heart disease of native coronary artery without angina pectoris: Secondary | ICD-10-CM | POA: Diagnosis not present

## 2022-05-31 DIAGNOSIS — Z79899 Other long term (current) drug therapy: Secondary | ICD-10-CM | POA: Insufficient documentation

## 2022-05-31 DIAGNOSIS — Z96641 Presence of right artificial hip joint: Secondary | ICD-10-CM | POA: Diagnosis not present

## 2022-05-31 DIAGNOSIS — I5022 Chronic systolic (congestive) heart failure: Secondary | ICD-10-CM | POA: Diagnosis not present

## 2022-05-31 DIAGNOSIS — I493 Ventricular premature depolarization: Secondary | ICD-10-CM | POA: Diagnosis not present

## 2022-05-31 DIAGNOSIS — Z96642 Presence of left artificial hip joint: Secondary | ICD-10-CM | POA: Diagnosis not present

## 2022-05-31 LAB — BASIC METABOLIC PANEL
Anion gap: 7 (ref 5–15)
BUN: 19 mg/dL (ref 6–20)
CO2: 26 mmol/L (ref 22–32)
Calcium: 9.4 mg/dL (ref 8.9–10.3)
Chloride: 105 mmol/L (ref 98–111)
Creatinine, Ser: 1.48 mg/dL — ABNORMAL HIGH (ref 0.61–1.24)
GFR, Estimated: 56 mL/min — ABNORMAL LOW (ref >60)
Glucose, Bld: 118 mg/dL — ABNORMAL HIGH (ref 70–99)
Potassium: 4.9 mmol/L (ref 3.5–5.1)
Sodium: 138 mmol/L (ref 135–145)

## 2022-05-31 LAB — CBC
HCT: 49.9 % (ref 39.0–52.0)
Hemoglobin: 15.8 g/dL (ref 13.0–17.0)
MCH: 26.8 pg (ref 26.0–34.0)
MCHC: 31.7 g/dL (ref 30.0–36.0)
MCV: 84.7 fL (ref 80.0–100.0)
Platelets: 182 10*3/uL (ref 150–400)
RBC: 5.89 MIL/uL — ABNORMAL HIGH (ref 4.22–5.81)
RDW: 14.8 % (ref 11.5–15.5)
WBC: 10.1 10*3/uL (ref 4.0–10.5)
nRBC: 0 % (ref 0.0–0.2)

## 2022-05-31 LAB — MAGNESIUM: Magnesium: 2.3 mg/dL (ref 1.7–2.4)

## 2022-05-31 MED ORDER — ENTRESTO 97-103 MG PO TABS: 1.0000 | ORAL_TABLET | Freq: Two times a day (BID) | ORAL | 11 refills | Status: DC

## 2022-05-31 NOTE — Progress Notes (Signed)
ADVANCED HF CLINIC CONSULT NOTE   Primary Care: [name], MD HF Cardiologist: Dr. Gala Romney  HPI: Mr Estabrook is a 56 y.o. with h/o OA, S/P R and L hip replacement 2022, HTN, HLD, and new diagnosis of systolic heart failure.  Admitted 5/23 with new acute CHF. Echo LVEF 25-30%, RV normal, Grade II DD. Diuresed with IV lasix with brisk diuresis.  R/LHC showed with mild nonobstructive CAD, low filling pressures with moderate to severely reduced CO. He underwent Cardiac MRI showing LVEF 21%, RVEF 35%, mid-myocardial LGE basal septum and mid inferior wall. Felt CM possible prior viral myocarditis, less likely cardiac sarcoidosis.GDMT started, mexiletine added for PVC suppression. He was discharged home, weight 244.6 lbs.  Today he returns for post hospital HF follow up. Overall feeling fine. Walking 1/2 mile daily without SOB. Main issue is poor sleep and daytime fatigue, he snores. Denies palpitations CP, dizziness, edema, or PND/Orthopnea. Appetite ok. No fever or chills. Weight at home 247 pounds. Taking all medications. No longer smoking since discharge. Rare ETOH use.  Cardiac Studies: - cMRI (5/23):  LVEF 21%, RVEF 35%, mid-myocardial LGE basal septum and mid inferior wall.   - R/LHC (5/23): minimal CAD.    Ao = 83/65 (71) LV = 98/9 RA = 2 RV = 20/7 PA = 20/12 (15) PCW = 5 Fick cardiac output/index = 4.1/1.9 PVR = 2.4 WU SVR = 1,244 Ao sat = 95% PA sat = 60%, 61%  - Echo (5/23): EF 25-30%, RV okay.  Review of Systems: [y] = yes, [ ]  = no   General: Weight gain [ ] ; Weight loss [ ] ; Anorexia [ ] ; Fatigue [ ] ; Fever [ ] ; Chills [ ] ; Weakness [ ]   Cardiac: Chest pain/pressure [ ] ; Resting SOB [ ] ; Exertional SOB [ ] ; Orthopnea [ ] ; Pedal Edema [ ] ; Palpitations [ ] ; Syncope [ ] ; Presyncope [ ] ; Paroxysmal nocturnal dyspnea[ ]   Pulmonary: Cough [ ] ; Wheezing[ ] ; Hemoptysis[ ] ; Sputum [ ] ; Snoring [place] ]  GI: Vomiting[ ] ; Dysphagia[ ] ; Melena[ ] ; Hematochezia [ ] ; Heartburn[ ] ;  Abdominal pain [ ] ; Constipation [ ] ; Diarrhea [ ] ; BRBPR [ ]   GU: Hematuria[ ] ; Dysuria [ ] ; Nocturia[ ]   Vascular: Pain in legs with walking [ ] ; Pain in feet with lying flat [ ] ; Non-healing sores [ ] ; Stroke [ ] ; TIA [ ] ; Slurred speech [ ] ;  Neuro: Headaches[ ] ; Vertigo[ ] ; Seizures[ ] ; Paresthesias[ ] ;Blurred vision [ ] ; Diplopia [ ] ; Vision changes [ ]   Ortho/Skin: Arthritis [ ] ; Joint pain [ ] ; Muscle pain [ ] ; Joint swelling [ ] ; Back Pain [ ] ; Rash [ ]   Psych: Depression[ ] ; Anxiety[ ]   Heme: Bleeding problems [ ] ; Clotting disorders [ ] ; Anemia [ ]   Endocrine: Diabetes [ ] ; Thyroid dysfunction[ ]   Past Medical History:  Diagnosis Date   Arthritis    Dry skin    on chest using goldbond lotion on   Hypertension    OA (osteoarthritis)    Sciatic nerve pain right comes and goes   Current Outpatient Medications  Medication Sig Dispense Refill   acetaminophen (TYLENOL) 325 MG tablet Take 325 mg by mouth every 6 (six) hours as needed for moderate pain or headache.     aspirin EC 81 MG tablet Take 1 tablet (81 mg total) by mouth daily. Swallow whole. 30 tablet 0   atorvastatin (LIPITOR) 40 MG tablet Take 1 tablet (40 mg total) by mouth daily. 30 tablet 0  carvedilol (COREG) 3.125 MG tablet Take 1 tablet (3.125 mg total) by mouth 2 (two) times daily with a meal. 60 tablet 0   dapagliflozin propanediol (FARXIGA) 10 MG TABS tablet Take 1 tablet (10 mg total) by mouth daily. 30 tablet 0   diphenhydrAMINE (BENADRYL) 25 MG tablet Take 25 mg by mouth every 6 (six) hours as needed for allergies.     furosemide (LASIX) 20 MG tablet Take 1 tablet (20 mg total) by mouth daily as needed for edema or fluid (as needed for shortness of breath, leg swelling or weight gain 3 lbs in 24 hrs or 5 lbs in 7 days.). 20 tablet 0   Hypromellose (ALZAIR ALLERGY NASAL SPRAY NA) Place 1 spray into both nostrils at bedtime.     mexiletine (MEXITIL) 150 MG capsule Take 2 capsules (300 mg total) by mouth 2 (two)  times daily. 120 capsule 0   sacubitril-valsartan (ENTRESTO) 49-51 MG Take 1 tablet by mouth 2 (two) times daily. 60 tablet 0   spironolactone (ALDACTONE) 25 MG tablet Take 1 tablet (25 mg total) by mouth daily. 30 tablet 0   No current facility-administered medications for this encounter.   No Known Allergies  Social History   Socioeconomic History   Marital status: Divorced    Spouse name: Not on file   Number of children: Not on file   Years of education: Not on file   Highest education level: Not on file  Occupational History   Not on file  Tobacco Use   Smoking status: Every Day    Packs/day: 0.50    Years: 37.00    Pack years: 18.50    Types: Cigarettes   Smokeless tobacco: Never  Vaping Use   Vaping Use: Never used  Substance and Sexual Activity   Alcohol use: Never   Drug use: Never   Sexual activity: Not on file  Other Topics Concern   Not on file  Social History Narrative   Not on file   Social Determinants of Health   Financial Resource Strain: Not on file  Food Insecurity: Not on file  Transportation Needs: Not on file  Physical Activity: Not on file  Stress: Not on file  Social Connections: Not on file  Intimate Partner Violence: Not on file   Family History  Problem Relation Age of Onset   Heart failure Father    Heart attack Brother    BP 124/70   Pulse 85   Wt 114.3 kg   SpO2 97%   BMI 38.32 kg/m   Wt Readings from Last 3 Encounters:  05/31/22 114.3 kg  05/13/22 110.6 kg  10/14/21 117.7 kg   PHYSICAL EXAM: General:  NAD. No resp difficulty HEENT: Normal Neck: Supple. No JVD. Carotids 2+ bilat; no bruits. No lymphadenopathy or thryomegaly appreciated. Cor: PMI nondisplaced. Regular rate & rhythm. No rubs, gallops or murmurs. Lungs: Clear Abdomen: Obese, soft, nontender, nondistended. No hepatosplenomegaly. No bruits or masses. Good bowel sounds. Extremities: No cyanosis, clubbing, rash, edema Neuro: Alert & oriented x 3, cranial  nerves grossly intact. Moves all 4 extremities w/o difficulty. Affect pleasant.  ECG (personally reviewed): NSR 83 bpm  ASSESSMENT & PLAN: Chronic Systolic Heart Failure: - Echo (5/23): EF 25-30%, RV okay. Nonischemic cardiomyopathy.  - R/LHC (5/23): with mild nonobstructive CAD, low filling pressures with moderate to severely reduced CO.  - Cardiac MRI (5/23): LVEF 21%, RVEF 35%, mid-myocardial LGE basal septum and mid inferior wall.  - Based on MRI, possible prior viral  myocarditis, less likely cardiac sarcoidosis.  Consider cPET/CT chest as outpatient.  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  - NYHA II, volume looks good today. - Increase Entresto to 97/103 mg bid. He has Co-pay card - Continue spironolactone 25 mg daily.  - Continue Coreg 3.125 mg bid.  - Continue Farxiga 10 mg daily. He has co-pay card - Labs today, repeat BMET in 7-10 days. - Repeat echo in 3 months after GDMT optimized.   2. HTN  - BP controlled.   - GDMT as above.   3. HLD - Continue atorvastin.    4. Tobacco Abuse - Remains quit since hospitalization. Congratulated. - Talked about plan if he restarts.    5. PVCs/NSVT  - Multifocal PVCs.  - None on ECG today - Continue 300 mg bid  - Arrange sleep study.  - Check Mag.   6. Non-obstructive CAD - Very mild. No chest pain - Continue statin/ASA  RTW: he is self-employed, drives commercially. Previously on disability for bilateral hip replacements. Would like him to remain out of work until at least APP follow up as we are adjusting medications. HFSW helping with resources.   Follow up in 3 weeks with PharmD (increase beta blocker), 6 weeks with APP and 12 weeks with Dr. Haroldine Laws + echo.  Allena Katz, FNP-BC 05/31/22

## 2022-05-31 NOTE — Progress Notes (Signed)
Patient Name: Alan Coffey        DOB: 10/09/1966      Height: 5"8"    Weight: 252lb  Office Name: [facility]         Referring Provider: Prince Rome, NP/ DanielBensimhon, MD  Today's Date:05/31/2022   STOP BANG RISK ASSESSMENT S (snore) Have you been told that you snore?     YES  T (tired) Are you often tired, fatigued, or sleepy during the day?   YES  O (obstruction) Do you stop breathing, choke, or gasp during sleep? YES   P (pressure) Do you have or are you being treated for high blood pressure? YES   B (BMI) Is your body index greater than 35 kg/m? YES   A (age) Are you 11 years old or older? YES   N (neck) Do you have a neck circumference greater than 16 inches?   YES   G (gender) Are you a male? YES   TOTAL STOP/BANG "YES" ANSWERS 8                                                                       For Office Use Only              Procedure Order Form    YES to 3+ Stop Bang questions OR two clinical symptoms - patient qualifies for WatchPAT (CPT 95800)             Clinical Notes: Will consult Sleep Specialist and refer for management of therapy due to patient increased risk of Sleep Apnea. Ordering a sleep study due to the following two clinical symptoms:  Loud snoring R06.83 Unrefreshed by sleep G47.8 History of high blood pressure R03.0 /   I understand that I am proceeding with a home sleep apnea test as ordered by my treating physician. I understand that untreated sleep apnea is a serious cardiovascular risk factor and it is my responsibility to perform the test and seek management for sleep apnea. I will be contacted with the results and be managed for sleep apnea by a local sleep physician. I will be receiving equipment and further instructions from [facility]. I shall promptly ship back the equipment via the included mailing label. I understand my insurance will be billed for the test and as the patient I am responsible for any  insurance related out-of-pocket costs incurred. I have been provided with written instructions and can call for additional video or telephonic instruction, with 24-hour availability of qualified personnel to answer any questions: Patient Help Desk [phone].  Patient Signature ______________________________________________________   Date______________________ Patient Telemedicine Verbal Consent  ITAMAR home sleep study given to patient, all instructions explained, and CLOUDPAT registration complete.

## 2022-05-31 NOTE — Addendum Note (Signed)
Encounter addended by: Burna Sis, LCSW on: 05/31/2022 3:06 PM  Actions taken: Flowsheet accepted, Clinical Note Signed

## 2022-05-31 NOTE — Progress Notes (Signed)
CSW consulted to speak with pt about disability case.  Pt was self employed but has been unable to work since last summer when he had issues with his hips.  Applied for [employer] disability last year- reports this case is still open and he just recently updated them regarding the new concerns with his heart.  CSW provided my number to him so he can provide to his case worker so they can request documents directly from me to help expedite this process.  Will continue to follow and assist as needed  Burna Sis, LCSW Clinical Social Worker [facility] Desk#: [phone] Cell#: [phone]

## 2022-05-31 NOTE — Addendum Note (Signed)
Encounter addended by: Kerry Dory, [name] on: 05/31/2022 11:02 AM  Actions taken: Clinical Note Signed

## 2022-05-31 NOTE — Patient Instructions (Signed)
INCREASE Entresto to 97/103 mg one tab twice a day  Labs today We will only contact you if something comes back abnormal or we need to make some changes. Otherwise no news is good news!  Labs needed in 10-14 days  Your physician recommends that you schedule a follow-up appointment in: 3 weeks with the pharmacy team, in 6 weeks  in the [facility] Practition[facility] (PA/NP) Clinic, and in 12 weeks with Dr Shirlee Latch and echo  Your physician has requested that you have an echocardiogram. Echocardiography is a painless test that uses sound waves to create images of your heart. It provides your doctor with information about the size and shape of your heart and how well your heart's chambers and valves are working. This procedure takes approximately one hour. There are no restrictions for this procedure.  Your provider has recommended that you have a home sleep study.  This has to be approved by your insurance company. We will schedule you an appointment to pick up the equipment to give [place] time to complete this authorization. Once you have the equipment you will download the app on your phone and follow the instructions. YOUR PIN NUMBER IS: [id number]. Once you have completed the test the information is sent to the company through [employer] and you can dispose of the equipment. If your test is positive you will receive a call from Dr Norris Cross office [facility]) to set up your CPAP equipment.  Do the following things EVERYDAY: Weigh yourself in the morning before breakfast. Write it down and keep it in a log. Take your medicines as prescribed Eat low salt foods--Limit salt (sodium) to 2000 mg per day.  Stay as active as you can everyday Limit all fluids for the day to less than 2 liters  At the [facility], you and your health needs are our priority. As part of our continuing mission to provide you with exceptional heart care, we have created designated Provider Care Teams. These Care  Teams include your primary Cardiologist (physician) and Advanced Practice Providers (APPs- Physician Assistants and Nurse Practitioners) who all work together to provide you with the care you need, when you need it.   You may see any of the following providers on your designated Care Team at your next follow up: Dr Arvilla Meres Dr Carron Curie, NP Robbie Lis, [place] [facility] [place], [place] Karle Plumber, PharmD   Please be sure to bring in all your medications bottles to every appointment.   If you have any questions or concerns before your next appointment please send [place] a message through [place] or call our office at [phone].    TO LEAVE A MESSAGE FOR THE NURSE SELECT OPTION 2, PLEASE LEAVE A MESSAGE INCLUDING: YOUR NAME DATE OF BIRTH CALL BACK NUMBER REASON FOR CALL**this is important as we prioritize the call backs  YOU WILL RECEIVE A CALL BACK THE SAME DAY AS LONG AS YOU CALL BEFORE 4:00 PM

## 2022-06-01 ENCOUNTER — Telehealth (HOSPITAL_COMMUNITY): Payer: Self-pay

## 2022-06-01 NOTE — Telephone Encounter (Addendum)
Pt aware, agreeable, and verbalized understanding   ----- Message from Jacklynn Ganong, FNP sent at 05/31/2022  4:35 PM EDT ----- SCr elevated. Do not increase Entresto to 97/103 as discussed today, keep at 49/51 bid.  Repeat BMET in 2 weeks

## 2022-06-02 ENCOUNTER — Encounter (INDEPENDENT_AMBULATORY_CARE_PROVIDER_SITE_OTHER): Payer: 59 | Admitting: Cardiology

## 2022-06-02 ENCOUNTER — Telehealth (HOSPITAL_COMMUNITY): Payer: Self-pay | Admitting: Surgery

## 2022-06-02 DIAGNOSIS — G4733 Obstructive sleep apnea (adult) (pediatric): Secondary | ICD-10-CM

## 2022-06-02 NOTE — Telephone Encounter (Signed)
Patient contacted regarding performing home sleep study.  I let him know that insurance prior authorization was not required and he can proceed with the study. 

## 2022-06-04 ENCOUNTER — Ambulatory Visit: Payer: 59

## 2022-06-04 DIAGNOSIS — I5022 Chronic systolic (congestive) heart failure: Secondary | ICD-10-CM

## 2022-06-04 NOTE — Procedures (Signed)
   SLEEP STUDY REPORT Patient Information Study Date: 06/02/22 Patient Name: Alan Coffey Patient ID: [id number] Birth Date: November 24, 2066 Age: 56 Gender: Male Referring Physician:Dalton Aundra Dubin, MD  TEST DESCRIPTION: Home sleep apnea testing was completed using the WatchPat, a Type 1 device, utilizing peripheral arterial tonometry (PAT), chest movement, actigraphy, pulse oximetry, pulse rate, body position and snore. AHI was calculated with apnea and hypopnea using valid sleep time as the denominator. RDI includes apneas, hypopneas, and RERAs. The data acquired and the scoring of sleep and all associated events were performed in accordance with the recommended standards and specifications as outlined in the AASM Manual for the Scoring of Sleep and Associated Events 2.2.0 (2015).  FINDINGS: 1. Severe Obstructive Sleep Apnea with AHI 44.1/hr. 2. No significant Central Sleep Apnea with pAHIc 5.2/hr. 3. Oxygen desaturations as low as 77%. 4. Severe snoring was present. O2 sats were < 88% for 26.4 min. 5. Total sleep time was 4 hrs and 24 min. 6. 30.2% of total sleep time was spent in REM sleep. 7. Shortened sleep onset latency at 7 min. 8. Normal REM sleep onset latency at 75 min. 9. Total awakenings were 16.  DIAGNOSIS: Severe Obstructive Sleep Apnea (G47.33) Nocturnal Hypoxemia  RECOMMENDATIONS: 1. Clinical correlation of these findings is necessary. The decision to treat obstructive sleep apnea (OSA) is usually based on the presence of apnea symptoms or the presence of associated medical conditions such as Hypertension, Congestive Heart Failure, Atrial Fibrillation or Obesity. The most common symptoms of OSA are snoring, gasping for breath while sleeping, daytime sleepiness and fatigue.  2. Initiating apnea therapy is recommended given the presence of symptoms and/or associated conditions. Recommend proceeding with one of the following:   a. Auto-CPAP therapy with a pressure  range of 5-20cm H2O.   b. An oral appliance (OA) that can be obtained from certain dentists with expertise in sleep medicine. These are primarily of use in non-obese patients with mild and moderate disease.   c. An ENT consultation which may be useful to look for specific causes of obstruction and possible treatment options.  d. If patient is intolerant to PAP therapy, consider referral to ENT for evaluation for hypoglossal nerve stimulator.  3. Close follow-up is necessary to ensure success with CPAP or oral appliance therapy for maximum benefit .  4. A follow-up oximetry study on CPAP is recommended to assess the adequacy of therapy and determine the need for supplemental oxygen or the potential need for Bi-level therapy. An arterial blood gas to determine the adequacy of baseline ventilation and oxygenation should also be considered.  5. Healthy sleep recommendations include: adequate nightly sleep (normal 7-9 hrs/night), avoidance of caffeine after noon and alcohol near bedtime, and maintaining a sleep environment that is cool, dark and quiet.  6. Weight loss for overweight patients is recommended. Even modest amounts of weight loss can significantly improve the severity of sleep apnea.  7. Snoring recommendations include: weight loss where appropriate, side sleeping, and avoidance of alcohol before bed.  8. Operation of motor vehicle should be avoided when sleepy.  Signature: Electronically Signed: 06/04/22 Fransico Him, MD; [facility]; [place], American Board of Sleep Medicine

## 2022-06-07 ENCOUNTER — Ambulatory Visit (HOSPITAL_COMMUNITY)
Admission: RE | Admit: 2022-06-07 | Discharge: 2022-06-07 | Disposition: A | Discharge status: Home / Self Care | Payer: 59 | Source: Ambulatory Visit | Attending: Internal Medicine | Admitting: Internal Medicine

## 2022-06-07 ENCOUNTER — Other Ambulatory Visit (HOSPITAL_COMMUNITY): Payer: Self-pay | Admitting: *Deleted

## 2022-06-07 ENCOUNTER — Encounter (HOSPITAL_COMMUNITY): Payer: Self-pay | Admitting: Cardiology

## 2022-06-07 DIAGNOSIS — I5022 Chronic systolic (congestive) heart failure: Secondary | ICD-10-CM

## 2022-06-07 MED ORDER — ASPIRIN 81 MG PO TBEC: 81.0000 mg | DELAYED_RELEASE_TABLET | Freq: Every day | ORAL | 0 refills | Status: DC

## 2022-06-07 MED ORDER — MEXILETINE HCL 150 MG PO CAPS: 300.0000 mg | ORAL_CAPSULE | Freq: Two times a day (BID) | ORAL | 0 refills | Status: DC

## 2022-06-07 MED ORDER — CARVEDILOL 3.125 MG PO TABS: 3.1250 mg | ORAL_TABLET | Freq: Two times a day (BID) | ORAL | 0 refills | Status: DC

## 2022-06-07 MED ORDER — SPIRONOLACTONE 25 MG PO TABS: 25.0000 mg | ORAL_TABLET | Freq: Every day | ORAL | 0 refills | Status: DC

## 2022-06-07 MED ORDER — ATORVASTATIN CALCIUM 40 MG PO TABS: 40.0000 mg | ORAL_TABLET | Freq: Every day | ORAL | 0 refills | Status: DC

## 2022-06-07 NOTE — Progress Notes (Signed)
[facility] Note   Primary Care: [name], MD HF Cardiologist: Dr. Shirlee Latch  HPI:  Alan Coffey is a 56 y.o. with h/o OA, S/P R and L hip replacement 2022, HTN, HLD, and new diagnosis of systolic heart failure.   Admitted 04/2022 with new acute CHF. Echo LVEF 25-30%, RV normal, Grade II DD. Diuresed with IV Lasix with brisk diuresis.  R/LHC showed with mild nonobstructive CAD, low filling pressures with moderate to severely reduced CO. He underwent Cardiac MRI showing LVEF 21%, RVEF 35%, mid-myocardial LGE basal septum and mid inferior wall. Felt CM possible prior viral myocarditis, less likely cardiac sarcoidosis. GDMT started, mexiletine added for PVC suppression. He was discharged home, weight 244.6 lbs.   [facility]ed to [facility] for post hospital HF follow up. Overall was feeling fine.  Reported he was able to walk 1/2 mile daily without SOB. Main issue was poor sleep and daytime fatigue, he snores. Denied palpitations CP, dizziness, edema, or PND/Orthopnea. Appetite was ok. No fever or chills. Weight at home was 247 pounds. Reported taking all medications. No longer smoking since discharge. Rare ETOH use.  Today he returns to HF clinic for pharmacist medication titration. At last visit with APP, Entresto was increased to 97/103 mg BID. However, Scr was elevated on labs that returned after patient visit. He was instructed to keep the Entresto at 49/51 mg BID.  Overall he is feeling well today. No dizziness, lightheadedness, CP or palpitations. No SOB/DOE. He walks 5 miles every day (2.5 miles in the morning and 2.5 miles in the evening). Weight has been stable at 246-248 lbs at home. Has not needed any PRN Lasix. No LEE, PND or orthopnea. Taking all medications as prescribed and tolerating all medications.    HF Medications: Carvedilol 3.125 mg BID Entresto 49/51 mg BID Farxiga 10 mg daily Spironolactone 25 mg daily Lasix 20 mg PRN   Has the patient been experiencing  any side effects to the medications prescribed?  no  Does the patient have any problems obtaining medications due to transportation or finances?   No - [employer]  Understanding of regimen: good Understanding of indications: good Potential of compliance: good Patient understands to avoid NSAIDs. Patient understands to avoid decongestants.    Pertinent Lab Values: 05/31/22: Serum creatinine 1.48, BUN 19, Potassium 4.9, Sodium 138 BMET today pending  Vital Signs: Weight: 249.4 lbs (last clinic weight: 252 lbs) Blood pressure: 148/94  Heart rate: 70   Assessment/Plan: Chronic Systolic Heart Failure: - Echo (04/2022): EF 25-30%, RV okay. Nonischemic cardiomyopathy.  - R/LHC (04/2022): with mild nonobstructive CAD, low filling pressures with moderate to severely reduced CO.  - Cardiac MRI (04/2022): LVEF 21%, RVEF 35%, mid-myocardial LGE basal septum and mid inferior wall.  - Based on MRI, possible prior viral myocarditis, less likely cardiac sarcoidosis.  Consider cPET/CT chest as outpatient.  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  - NYHA II, volume looks good today. - BMET today pending - Continue Lasix 20 mg PRN only - Increase carvedilol to 6.25 mg BID.  - Continue Entresto 49/51 mg BID. He has Co-pay card - Continue spironolactone 25 mg daily.  - Continue Farxiga 10 mg daily. He has co-pay card - Repeat echo 08/2022 after GDMT optimized.   2. HTN  - BP elevated today, increase carvedilol as above.    3. HLD - Continue atorvastatin.  4. Tobacco Abuse - Remains quit since hospitalization. Congratulated.    5. PVCs/NSVT  - Multifocal PVCs.  - None on ECG 05/31/22 - Continue mexiletine 300 mg BID  - Previously referred for sleep study.    6. Non-obstructive CAD - Very mild. No chest pain - Continue statin/ASA  Follow up 3 weeks with APP Clinic   Karle Plumber, PharmD, BCPS, BCCP, CPP Heart Failure Clinic Pharmacist [phone]

## 2022-06-08 ENCOUNTER — Other Ambulatory Visit (HOSPITAL_COMMUNITY): Payer: Self-pay | Admitting: *Deleted

## 2022-06-08 MED ORDER — DAPAGLIFLOZIN PROPANEDIOL 10 MG PO TABS: 10.0000 mg | ORAL_TABLET | Freq: Every day | ORAL | 6 refills | Status: DC

## 2022-06-13 ENCOUNTER — Telehealth: Payer: Self-pay | Admitting: *Deleted

## 2022-06-13 DIAGNOSIS — I10 Essential (primary) hypertension: Secondary | ICD-10-CM

## 2022-06-13 DIAGNOSIS — G4733 Obstructive sleep apnea (adult) (pediatric): Secondary | ICD-10-CM

## 2022-06-13 DIAGNOSIS — I2510 Atherosclerotic heart disease of native coronary artery without angina pectoris: Secondary | ICD-10-CM

## 2022-06-13 NOTE — Telephone Encounter (Signed)
-----   Message from Gaynelle Cage, [name] sent at 06/05/2022 10:05 AM EDT -----  ----- Message ----- From: Quintella Reichert, MD Sent: 06/04/2022   6:46 PM EDT To: Cv Div Sleep Studies  Please let patient know that they have sleep apnea.  Recommend therapeutic CPAP titration for treatment of patient's sleep disordered breathing.  If unable to perform an in lab titration then initiate ResMed auto CPAP from 4 to 15cm H2O with heated humidity and mask of choice and overnight pulse ox on CPAP.

## 2022-06-14 ENCOUNTER — Telehealth (HOSPITAL_COMMUNITY): Payer: Self-pay | Admitting: Licensed Clinical Social Worker

## 2022-06-14 NOTE — Telephone Encounter (Signed)
H&V Care Navigation CSW Progress Note  Clinical Social Worker contacted patient by phone to discuss coming to clinic to apply for SNAP with [employer].   SDOH Screenings   Alcohol Screen: Not on file  Depression (OAC1-6): Not on file  Financial Resource Strain: Medium Risk (05/31/2022)   Overall Financial Resource Strain (CARDIA)    Difficulty of Paying Living Expenses: Somewhat hard  Food Insecurity: No Food Insecurity (05/31/2022)   Hunger Vital Sign    Worried About Running Out of Food in the Last Year: Never true    Ran Out of Food in the Last Year: Never true  Housing: Not on file  Physical Activity: Not on file  Social Connections: Not on file  Stress: Not on file  Tobacco Use: High Risk (05/31/2022)   Patient History    Smoking Tobacco Use: Every Day    Smokeless Tobacco Use: Never    Passive Exposure: Not on file  Transportation Needs: Not on file    CSW called pt to discuss making appt with clinic to sign up for SNAP benefits with [employer]- pt agreeable and will plan to come to clinic at 10am on 6/28 for appt  Burna Sis, LCSW Clinical Social Worker [facility] Desk#: [phone] Cell#: [phone]

## 2022-06-21 ENCOUNTER — Telehealth (HOSPITAL_COMMUNITY): Payer: Self-pay | Admitting: Licensed Clinical Social Worker

## 2022-06-21 NOTE — Telephone Encounter (Addendum)
Pt completed food stamp appt with [employer] to complete SNAP   CSW provided pt with food bag to assist with food insecurity  Burna Sis, LCSW Clinical Social Worker [facility] Desk#: [phone] Cell#: [phone]

## 2022-06-22 ENCOUNTER — Other Ambulatory Visit (HOSPITAL_COMMUNITY): Payer: Self-pay | Admitting: Internal Medicine

## 2022-06-22 ENCOUNTER — Ambulatory Visit (HOSPITAL_COMMUNITY)
Admission: RE | Admit: 2022-06-22 | Discharge: 2022-06-22 | Disposition: A | Discharge status: Home / Self Care | Payer: 59 | Source: Ambulatory Visit | Attending: Internal Medicine | Admitting: Internal Medicine

## 2022-06-22 VITALS — BP 148/94 | HR 70 | Wt 249.4 lb

## 2022-06-22 DIAGNOSIS — Z7982 Long term (current) use of aspirin: Secondary | ICD-10-CM | POA: Diagnosis not present

## 2022-06-22 DIAGNOSIS — I5022 Chronic systolic (congestive) heart failure: Secondary | ICD-10-CM | POA: Insufficient documentation

## 2022-06-22 DIAGNOSIS — Z7902 Long term (current) use of antithrombotics/antiplatelets: Secondary | ICD-10-CM | POA: Diagnosis not present

## 2022-06-22 DIAGNOSIS — I428 Other cardiomyopathies: Secondary | ICD-10-CM | POA: Insufficient documentation

## 2022-06-22 DIAGNOSIS — Z96642 Presence of left artificial hip joint: Secondary | ICD-10-CM | POA: Diagnosis not present

## 2022-06-22 DIAGNOSIS — I493 Ventricular premature depolarization: Secondary | ICD-10-CM | POA: Diagnosis not present

## 2022-06-22 DIAGNOSIS — I2510 Atherosclerotic heart disease of native coronary artery without angina pectoris: Secondary | ICD-10-CM | POA: Diagnosis not present

## 2022-06-22 DIAGNOSIS — E785 Hyperlipidemia, unspecified: Secondary | ICD-10-CM | POA: Insufficient documentation

## 2022-06-22 DIAGNOSIS — Z79899 Other long term (current) drug therapy: Secondary | ICD-10-CM | POA: Diagnosis not present

## 2022-06-22 DIAGNOSIS — I110 Hypertensive heart disease with heart failure: Secondary | ICD-10-CM | POA: Insufficient documentation

## 2022-06-22 DIAGNOSIS — Z87891 Personal history of nicotine dependence: Secondary | ICD-10-CM | POA: Diagnosis not present

## 2022-06-22 LAB — BASIC METABOLIC PANEL
Anion gap: 7 (ref 5–15)
BUN: 11 mg/dL (ref 6–20)
CO2: 25 mmol/L (ref 22–32)
Calcium: 8.7 mg/dL — ABNORMAL LOW (ref 8.9–10.3)
Chloride: 108 mmol/L (ref 98–111)
Creatinine, Ser: 1.18 mg/dL (ref 0.61–1.24)
GFR, Estimated: >60 mL/min (ref >60)
Glucose, Bld: 101 mg/dL — ABNORMAL HIGH (ref 70–99)
Potassium: 3.7 mmol/L (ref 3.5–5.1)
Sodium: 140 mmol/L (ref 135–145)

## 2022-06-22 MED ORDER — SACUBITRIL-VALSARTAN 49-51 MG PO TABS: 1.0000 | ORAL_TABLET | Freq: Two times a day (BID) | ORAL | 3 refills | Status: DC

## 2022-06-22 MED ORDER — MEXILETINE HCL 150 MG PO CAPS: 300.0000 mg | ORAL_CAPSULE | Freq: Two times a day (BID) | ORAL | 3 refills | Status: DC

## 2022-06-22 MED ORDER — SPIRONOLACTONE 25 MG PO TABS: 25.0000 mg | ORAL_TABLET | Freq: Every day | ORAL | 3 refills | Status: DC

## 2022-06-22 MED ORDER — ASPIRIN 81 MG PO TBEC: 81.0000 mg | DELAYED_RELEASE_TABLET | Freq: Every day | ORAL | 0 refills | Status: AC

## 2022-06-22 MED ORDER — DAPAGLIFLOZIN PROPANEDIOL 10 MG PO TABS: 10.0000 mg | ORAL_TABLET | Freq: Every day | ORAL | 3 refills | Status: DC

## 2022-06-22 MED ORDER — FUROSEMIDE 20 MG PO TABS: 20.0000 mg | ORAL_TABLET | Freq: Every day | ORAL | 1 refills | Status: DC | PRN

## 2022-06-22 MED ORDER — ATORVASTATIN CALCIUM 40 MG PO TABS: 40.0000 mg | ORAL_TABLET | Freq: Every day | ORAL | 3 refills | Status: DC

## 2022-06-22 MED ORDER — CARVEDILOL 6.25 MG PO TABS: 6.2500 mg | ORAL_TABLET | Freq: Two times a day (BID) | ORAL | 3 refills | Status: DC

## 2022-06-22 NOTE — Patient Instructions (Addendum)
It was a pleasure seeing you today!  MEDICATIONS: -We are changing your medications today -Increase carvedilol to 6.25 mg (1 tablet) twice daily -Call Dr. Norris Cross office at [phone] to discuss sleep study results.  -Call if you have questions about your medications.  LABS: -We will call you if your labs need attention.  NEXT APPOINTMENT: Return to clinic in 3 weeks with [facility].  In general, to take care of your heart failure: -Limit your fluid intake to 2 Liters (half-gallon) per day.   -Limit your salt intake to ideally 2-3 grams (2000-3000 mg) per day. -Weigh yourself daily and record, and bring that "weight diary" to your next appointment.  (Weight gain of 2-3 pounds in 1 day typically means fluid weight.) -The medications for your heart are to help your heart and help you live longer.   -Please contact us before stopping any of your heart medications.  Call the clinic at [phone] with questions or to reschedule future appointments.

## 2022-07-03 NOTE — Telephone Encounter (Signed)
The patient has been notified of the result and verbalized understanding.  All questions (if any) were answered. Latrelle Dodrill, [name] 07/03/2022 4:35 PM    Precert titration

## 2022-07-03 NOTE — Addendum Note (Signed)
Addended by: Reesa Chew on: 07/03/2022 05:04 PM   Modules accepted: Orders

## 2022-07-11 NOTE — Progress Notes (Signed)
ADVANCED HF CLINIC NOTE   Primary Care: [name], MD HF Cardiologist: Dr. Gala Romney  HPI: [name] is a 56 y.o. with h/o OA, S/P R and L hip replacement 2022, HTN, HLD, and new diagnosis of systolic heart failure.  Admitted 5/23 with new acute CHF. Echo EF 25-30%, RV normal, Grade II DD. Diuresed with IV lasix with brisk diuresis.  R/LHC showed with mild nonobstructive CAD, low filling pressures with moderate to severely reduced CO. He underwent Cardiac MRI showing LVEF 21%, RVEF 35%, mid-myocardial LGE basal septum and mid inferior wall. Felt CM possible prior viral myocarditis, less likely cardiac sarcoidosis.GDMT started, mexiletine added for PVC suppression. He was discharged home, weight 244.6 lbs.  Post hospital follow up 6/23, NYHA II and volume OK. Entresto increased but labs showed mild bump in SCr so he remained at 49/51 dose.  Today he returns for HF follow up. Overall feeling fine. He walks 2.5 miles every morning and has no SOB with this.  Denies palpitations, CP, dizziness, edema, or PND/Orthopnea. Appetite ok. No fever or chills. Weight at home 246 pounds. Taking all medications. Has not needed Lasix. Has daytime fatigue and naps. Remains tobacco free since discharge.  Cardiac Studies: - cMRI (5/23):  LVEF 21%, RVEF 35%, mid-myocardial LGE basal septum and mid inferior wall.   - R/LHC (5/23): minimal CAD.    Ao = 83/65 (71) LV = 98/9 RA = 2 RV = 20/7 PA = 20/12 (15) PCW = 5 Fick cardiac output/index = 4.1/1.9 PVR = 2.4 WU SVR = 1,244 Ao sat = 95% PA sat = 60%, 61%  - Echo (5/23): EF 25-30%, RV okay.  ROS: All systems reviewed and negative except as per HPI.   Past Medical History:  Diagnosis Date   Arthritis    Dry skin    on chest using goldbond lotion on   Hypertension    OA (osteoarthritis)    Sciatic nerve pain right comes and goes   Current Outpatient Medications  Medication Sig Dispense Refill   acetaminophen (TYLENOL) 325 MG tablet Take  325 mg by mouth every 6 (six) hours as needed for moderate pain or headache.     aspirin EC 81 MG tablet Take 1 tablet (81 mg total) by mouth daily. Swallow whole. 30 tablet 0   atorvastatin (LIPITOR) 40 MG tablet Take 1 tablet (40 mg total) by mouth daily. 90 tablet 3   carvedilol (COREG) 6.25 MG tablet Take 1 tablet (6.25 mg total) by mouth 2 (two) times daily with a meal. 180 tablet 3   dapagliflozin propanediol (FARXIGA) 10 MG TABS tablet Take 1 tablet (10 mg total) by mouth daily. 90 tablet 3   diphenhydrAMINE (BENADRYL) 25 MG tablet Take 25 mg by mouth every 6 (six) hours as needed for allergies.     furosemide (LASIX) 20 MG tablet Take 1 tablet (20 mg total) by mouth daily as needed for edema or fluid (as needed for shortness of breath, leg swelling or weight gain 3 lbs in 24 hrs or 5 lbs in 7 days.). 90 tablet 1   Hypromellose (ALZAIR ALLERGY NASAL SPRAY NA) Place 1 spray into both nostrils at bedtime.     mexiletine (MEXITIL) 150 MG capsule Take 2 capsules (300 mg total) by mouth 2 (two) times daily. 360 capsule 3   sacubitril-valsartan (ENTRESTO) 49-51 MG Take 1 tablet by mouth 2 (two) times daily. 180 tablet 3   spironolactone (ALDACTONE) 25 MG tablet Take 1 tablet (25 mg total)  by mouth daily. 90 tablet 3   No current facility-administered medications for this encounter.   No Known Allergies  Social History   Socioeconomic History   Marital status: Divorced    Spouse name: Not on file   Number of children: Not on file   Years of education: Not on file   Highest education level: Not on file  Occupational History   Not on file  Tobacco Use   Smoking status: Every Day    Packs/day: 0.50    Years: 37.00    Total pack years: 18.50    Types: Cigarettes   Smokeless tobacco: Never  Vaping Use   Vaping Use: Never used  Substance and Sexual Activity   Alcohol use: Never   Drug use: Never   Sexual activity: Not on file  Other Topics Concern   Not on file  Social History  Narrative   Not on file   Social Determinants of Health   Financial Resource Strain: Medium Risk (05/31/2022)   Overall Financial Resource Strain (CARDIA)    Difficulty of Paying Living Expenses: Somewhat hard  Food Insecurity: No Food Insecurity (05/31/2022)   Hunger Vital Sign    Worried About Running Out of Food in the Last Year: Never true    Ran Out of Food in the Last Year: Never true  Transportation Needs: Not on file  Physical Activity: Not on file  Stress: Not on file  Social Connections: Not on file  Intimate Partner Violence: Not on file   Family History  Problem Relation Age of Onset   Heart failure Father    Heart attack Brother    BP (!) 148/98 (BP Location: Right Arm, Patient Position: Sitting)   Pulse 73   Wt 112 kg (247 lb)   SpO2 96%   BMI 37.56 kg/m   Wt Readings from Last 3 Encounters:  07/12/22 112 kg (247 lb)  06/22/22 113.1 kg (249 lb 6.4 oz)  05/31/22 114.3 kg (252 lb)   PHYSICAL EXAM: General:  NAD. No resp difficulty HEENT: Normal Neck: Supple. No JVD. Carotids 2+ bilat; no bruits. No lymphadenopathy or thryomegaly appreciated. Cor: PMI nondisplaced. Regular rate & rhythm. No rubs, gallops or murmurs. Lungs: Clear Abdomen: Obese, soft, nontender, nondistended. No hepatosplenomegaly. No bruits or masses. Good bowel sounds. Extremities: No cyanosis, clubbing, rash, edema Neuro: Alert & oriented x 3, cranial nerves grossly intact. Moves all 4 extremities w/o difficulty. Affect pleasant.  ECG (personally reviewed): none ordered today.  ASSESSMENT & PLAN: Chronic Systolic Heart Failure: - Echo (5/23): EF 25-30%, RV okay. Nonischemic cardiomyopathy.  - R/LHC (5/23): with mild nonobstructive CAD, low filling pressures with moderate to severely reduced CO.  - Cardiac MRI (5/23): LVEF 21%, RVEF 35%, mid-myocardial LGE basal septum and mid inferior wall.  - Based on MRI, possible prior viral myocarditis, less likely cardiac sarcoidosis.  Consider  cPET/CT chest. 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  - NYHA II, volume looks good today. - Increase Entresto to 97/103 mg bid.  - Continue Lasix 20 mg daily PRN. - Continue spironolactone 25 mg daily.  - Continue Coreg 6.25 mg bid.  - Continue Farxiga 10 mg daily.  - Recent labs ok, K 3.7, creatinine 1.18. Repeat BMET in 10-14 days. - Repeat echo in 2-3 months. - BMET in 10-14 days.   2. HTN  - Elevated today. - Increase Entresto as above. - May need addition of  BiDil.   3. HLD - Continue atorvastatin.    4. Tobacco Abuse - Remains quit since hospitalization. Congratulated. - Talked about plan if he restarts.    5. PVCs/NSVT  - Multifocal PVCs.  - Continue mexiletine 300 mg bid  - Awaiting CPAP titration.   6. Non-obstructive CAD - Very mild.  - No chest pain. - Continue statin/ASA  7. OSA - Severe, AHI 44/hr (6/23). - Has in-lab CPAP titration soon   Follow up in 2-3 months with Dr. Gala Romney + echo, as scheduled.  Prince Rome, FNP-BC 07/12/22

## 2022-07-12 ENCOUNTER — Encounter (HOSPITAL_COMMUNITY): Payer: Self-pay

## 2022-07-12 ENCOUNTER — Ambulatory Visit (HOSPITAL_COMMUNITY)
Admission: RE | Admit: 2022-07-12 | Discharge: 2022-07-12 | Disposition: A | Discharge status: Home / Self Care | Payer: 59 | Source: Ambulatory Visit | Attending: Family Medicine | Admitting: Family Medicine

## 2022-07-12 VITALS — BP 148/98 | HR 73 | Wt 247.0 lb

## 2022-07-12 DIAGNOSIS — I428 Other cardiomyopathies: Secondary | ICD-10-CM | POA: Diagnosis not present

## 2022-07-12 DIAGNOSIS — M1990 Unspecified osteoarthritis, unspecified site: Secondary | ICD-10-CM | POA: Diagnosis not present

## 2022-07-12 DIAGNOSIS — I493 Ventricular premature depolarization: Secondary | ICD-10-CM | POA: Insufficient documentation

## 2022-07-12 DIAGNOSIS — G4733 Obstructive sleep apnea (adult) (pediatric): Secondary | ICD-10-CM | POA: Insufficient documentation

## 2022-07-12 DIAGNOSIS — Z87891 Personal history of nicotine dependence: Secondary | ICD-10-CM | POA: Diagnosis not present

## 2022-07-12 DIAGNOSIS — I472 Ventricular tachycardia, unspecified: Secondary | ICD-10-CM | POA: Diagnosis not present

## 2022-07-12 DIAGNOSIS — I2510 Atherosclerotic heart disease of native coronary artery without angina pectoris: Secondary | ICD-10-CM | POA: Diagnosis not present

## 2022-07-12 DIAGNOSIS — I10 Essential (primary) hypertension: Secondary | ICD-10-CM | POA: Diagnosis not present

## 2022-07-12 DIAGNOSIS — Z79899 Other long term (current) drug therapy: Secondary | ICD-10-CM | POA: Insufficient documentation

## 2022-07-12 DIAGNOSIS — E785 Hyperlipidemia, unspecified: Secondary | ICD-10-CM | POA: Insufficient documentation

## 2022-07-12 DIAGNOSIS — I5022 Chronic systolic (congestive) heart failure: Secondary | ICD-10-CM | POA: Diagnosis present

## 2022-07-12 DIAGNOSIS — I110 Hypertensive heart disease with heart failure: Secondary | ICD-10-CM | POA: Diagnosis present

## 2022-07-12 MED ORDER — ENTRESTO 97-103 MG PO TABS: 1.0000 | ORAL_TABLET | Freq: Two times a day (BID) | ORAL | 6 refills | Status: DC

## 2022-07-12 NOTE — Patient Instructions (Signed)
Thank you for coming in today  No labs today   Your physician recommends that you schedule a follow-up appointment in:  2 weeks for BMET   INCREASE Entresto to 97/103 1 tablet twice daily  Your physician recommends that you schedule a follow-up appointment in:  09/15/2022 at 2:40pm with Dr. Gala Romney  At the [facility], you and your health needs are our priority. As part of our continuing mission to provide you with exceptional heart care, we have created designated Provider Care Teams. These Care Teams include your primary Cardiologist (physician) and Advanced Practice Providers (APPs- Physician Assistants and Nurse Practitioners) who all work together to provide you with the care you need, when you need it.   You may see any of the following providers on your designated Care Team at your next follow up: Dr Arvilla Meres Dr Carron Curie, NP Robbie Lis, [facility], [place] Karle Plumber, PharmD   Please be sure to bring in all your medications bottles to every appointment.   If you have any questions or concerns before your next appointment please send [place] a message through [place] or call our office at [phone].    TO LEAVE A MESSAGE FOR THE NURSE SELECT OPTION 2, PLEASE LEAVE A MESSAGE INCLUDING: YOUR NAME DATE OF BIRTH CALL BACK NUMBER REASON FOR CALL**this is important as we prioritize the call backs  YOU WILL RECEIVE A CALL BACK THE SAME DAY AS LONG AS YOU CALL BEFORE 4:00 PM

## 2022-07-26 ENCOUNTER — Ambulatory Visit (HOSPITAL_COMMUNITY)
Admission: RE | Admit: 2022-07-26 | Discharge: 2022-07-26 | Disposition: A | Discharge status: Home / Self Care | Payer: 59 | Source: Ambulatory Visit | Attending: Cardiology | Admitting: Cardiology

## 2022-07-26 DIAGNOSIS — I5022 Chronic systolic (congestive) heart failure: Secondary | ICD-10-CM | POA: Insufficient documentation

## 2022-07-26 LAB — BASIC METABOLIC PANEL
Anion gap: 6 (ref 5–15)
BUN: 14 mg/dL (ref 6–20)
CO2: 25 mmol/L (ref 22–32)
Calcium: 9.2 mg/dL (ref 8.9–10.3)
Chloride: 105 mmol/L (ref 98–111)
Creatinine, Ser: 1.28 mg/dL — ABNORMAL HIGH (ref 0.61–1.24)
GFR, Estimated: >60 mL/min (ref >60)
Glucose, Bld: 111 mg/dL — ABNORMAL HIGH (ref 70–99)
Potassium: 4.9 mmol/L (ref 3.5–5.1)
Sodium: 136 mmol/L (ref 135–145)

## 2022-08-06 ENCOUNTER — Ambulatory Visit (HOSPITAL_BASED_OUTPATIENT_CLINIC_OR_DEPARTMENT_OTHER): Payer: 59 | Attending: Cardiology | Admitting: Cardiology

## 2022-08-06 VITALS — Ht 68.0 in | Wt 240.0 lb

## 2022-08-06 DIAGNOSIS — I10 Essential (primary) hypertension: Secondary | ICD-10-CM | POA: Insufficient documentation

## 2022-08-06 DIAGNOSIS — I2510 Atherosclerotic heart disease of native coronary artery without angina pectoris: Secondary | ICD-10-CM | POA: Diagnosis not present

## 2022-08-06 DIAGNOSIS — G4733 Obstructive sleep apnea (adult) (pediatric): Secondary | ICD-10-CM | POA: Insufficient documentation

## 2022-08-07 ENCOUNTER — Other Ambulatory Visit (HOSPITAL_COMMUNITY): Payer: Self-pay | Admitting: *Deleted

## 2022-08-07 ENCOUNTER — Other Ambulatory Visit (HOSPITAL_COMMUNITY): Payer: Self-pay

## 2022-08-07 MED ORDER — MEXILETINE HCL 150 MG PO CAPS
300.0000 mg | ORAL_CAPSULE | Freq: Two times a day (BID) | ORAL | 5 refills | Status: DC
  Filled 2022-08-07: qty 360, 90d supply, fill #0

## 2022-08-07 MED ORDER — DAPAGLIFLOZIN PROPANEDIOL 10 MG PO TABS
10.0000 mg | ORAL_TABLET | Freq: Every day | ORAL | 3 refills | Status: DC
  Filled 2022-08-07: qty 90, 90d supply, fill #0
  Filled 2022-12-21: qty 30, 30d supply, fill #0

## 2022-08-07 NOTE — Procedures (Signed)
   Patient Name: Alan, Coffey n Study Date: 08/06/2022 Gender: Male D.O.B: 1966-11-20 Age (years): 36 Referring Provider: Armanda Magic MD, ABSM Height (inches): 68 Interpreting Physician: Armanda Magic MD, ABSM Weight (lbs): 240 RPSGT: [place], [place] BMI: 36 MRN: [id number] Neck Size: 15.75  CLINICAL INFORMATION The patient is referred for a CPAP titration to treat sleep apnea.  SLEEP STUDY TECHNIQUE As per the AASM Manual for the Scoring of Sleep and Associated Events v2.3 (April 2016) with a hypopnea requiring 4% desaturations.  The channels recorded and monitored were frontal, central and occipital EEG, electrooculogram (EOG), submentalis EMG (chin), nasal and oral airflow, thoracic and abdominal wall motion, anterior tibialis EMG, snore microphone, electrocardiogram, and pulse oximetry. Continuous positive airway pressure (CPAP) was initiated at the beginning of the study and titrated to treat sleep-disordered breathing.  MEDICATIONS Medications self-administered by patient taken the night of the study : N/A  TECHNICIAN COMMENTS Comments added by technician: Patient was restless all through the night. Comments added by scorer: N/A  RESPIRATORY PARAMETERS Optimal PAP Pressure (cm): 10  AHI at Optimal Pressure (/hr):0 Overall Minimal O2 (%):87.0  Supine % at Optimal Pressure (%):56 Minimal O2 at Optimal Pressure (%): 88.0   SLEEP ARCHITECTURE The study was initiated at 10:39:16 PM and ended at 4:52:29 AM.  Sleep onset time was 4.2 minutes and the sleep efficiency was 86.8%. The total sleep time was 324 minutes.  The patient spent 12.3% of the night in stage N1 sleep, 69.6% in stage N2 sleep, 1.1% in stage N3 and 17% in REM.Stage REM latency was 65.0 minutes  Wake after sleep onset was 45.0. Alpha intrusion was absent. Supine sleep was 43.59%.  CARDIAC DATA The 2 lead EKG demonstrated sinus rhythm. The mean heart rate was 60.1 beats per minute. Other EKG findings  include: PVCs and PACs  LEG MOVEMENT DATA The total Periodic Limb Movements of Sleep (PLMS) were 0. The PLMS index was 0.0. A PLMS index of <15 is considered normal in adults.  IMPRESSIONS - The optimal PAP pressure was 10 cm of water. - Central sleep apnea was not noted during this titration (CAI = 1.1/h). - Mild oxygen desaturations were observed during this titration (min O2 = 87.0%). - No snoring was audible during this study. - 2-lead EKG demonstrated: PVCs and PACS - Clinically significant periodic limb movements were not noted during this study. Arousals associated with PLMs were rare.  DIAGNOSIS - Obstructive Sleep Apnea (G47.33)  RECOMMENDATIONS - Trial of CPAP therapy on 10 cm H2O with a Small-Medium size Fisher&Paykel Full Face Evora Full mask and heated humidification. - Avoid alcohol, sedatives and other CNS depressants that may worsen sleep apnea and disrupt normal sleep architecture. - Sleep hygiene should be reviewed to assess factors that may improve sleep quality. - Weight management and regular exercise should be initiated or continued. - Return to [facility] for re-evaluation after 6 weeks of therapy  [Electronically signed] 08/07/2022 08:10 AM  Armanda Magic MD, ABSM Diplomate, American Board of Sleep Medicine

## 2022-08-08 ENCOUNTER — Telehealth (HOSPITAL_COMMUNITY): Payer: Self-pay | Admitting: Pharmacy Technician

## 2022-08-08 ENCOUNTER — Other Ambulatory Visit (HOSPITAL_COMMUNITY): Payer: Self-pay

## 2022-08-08 NOTE — Telephone Encounter (Signed)
Patient Advocate Encounter   Received notification from [employer] that prior authorization for Mexiletine is required.   PA submitted on CoverMyMeds Key [id number] Status is pending   Will continue to follow.

## 2022-08-08 NOTE — Telephone Encounter (Signed)
Patient Advocate Encounter   Received notification from [employer] that prior authorization for Alan Coffey is required.   PA submitted on CoverMyMeds Key [id number] Status is pending   Will continue to follow.

## 2022-08-09 ENCOUNTER — Other Ambulatory Visit (HOSPITAL_COMMUNITY): Payer: Self-pay

## 2022-08-09 NOTE — Telephone Encounter (Signed)
Advanced [facility] Failure Patient Advocate Encounter  Prior Authorization for Mexiletine has been approved.    PA# [id number] Effective dates: 08/09/22 through 08/10/23  Patients co-pay is $0 (insurance allows 30 day fills)  Archer Asa, CPhT

## 2022-08-09 NOTE — Telephone Encounter (Signed)
Advanced Heart Failure Patient Advocate Encounter  Prior Authorization for Marcelline Deist has been approved.    PA# [id number] Effective dates: 08/08/22 through 08/09/23  Patients co-pay is $15 (30 days)  Archer Asa, CPhT

## 2022-08-10 ENCOUNTER — Other Ambulatory Visit (HOSPITAL_COMMUNITY): Payer: Self-pay

## 2022-08-23 ENCOUNTER — Telehealth: Payer: Self-pay | Admitting: *Deleted

## 2022-08-23 NOTE — Telephone Encounter (Signed)
The patient has been notified of the result and verbalized understanding.  All questions (if any) were answered. Latrelle Dodrill, [name] 08/23/2022 1:04 PM    Upon patient request DME selection is [facility]. Patient understands he will be contacted by [facility] to set up his cpap. Patient understands to call if [facility] does not contact him with new setup in a timely manner. Patient understands they will be called once confirmation has been received from [facility]/ that they have received their new machine to schedule 10 week follow up appointment.   [facility] notified of new cpap order  Please add to airview Patient was grateful for the call and thanked me.

## 2022-08-23 NOTE — Telephone Encounter (Signed)
-----   Message from Gaynelle Cage, [name] sent at 08/07/2022  4:58 PM EDT -----  ----- Message ----- From: Quintella Reichert, MD Sent: 08/07/2022   8:13 AM EDT To: Cv Div Sleep Studies  Please let patient know that they had a successful PAP titration and let DME know that orders are in EPIC.  Please set up 6 week OV with me.

## 2022-09-08 ENCOUNTER — Encounter (HOSPITAL_COMMUNITY): Payer: 59 | Admitting: Cardiology

## 2022-09-08 ENCOUNTER — Ambulatory Visit (HOSPITAL_COMMUNITY)
Admission: RE | Admit: 2022-09-08 | Discharge: 2022-09-08 | Disposition: A | Discharge status: Home / Self Care | Payer: 59 | Source: Ambulatory Visit | Attending: Internal Medicine | Admitting: Internal Medicine

## 2022-09-08 DIAGNOSIS — I5022 Chronic systolic (congestive) heart failure: Secondary | ICD-10-CM | POA: Diagnosis not present

## 2022-09-08 LAB — ECHOCARDIOGRAM COMPLETE
Area-P 1/2: 3.17 cm2
S' Lateral: 4.2 cm

## 2022-09-09 NOTE — Telephone Encounter (Signed)
New, Adah Salvage, [name]; Gwenith Daily,     Upon review, patient has [employer] insurance. We are out of net work with this insurance. Patient will need to find another provider that will take his insurance.

## 2022-09-11 ENCOUNTER — Telehealth (HOSPITAL_COMMUNITY): Payer: Self-pay

## 2022-09-11 NOTE — Telephone Encounter (Signed)
Pt aware, agreeable, and verbalized understanding 

## 2022-09-15 ENCOUNTER — Encounter: Payer: 59 | Admitting: *Deleted

## 2022-09-15 ENCOUNTER — Ambulatory Visit (HOSPITAL_COMMUNITY)
Admission: RE | Admit: 2022-09-15 | Discharge: 2022-09-15 | Disposition: A | Discharge status: Home / Self Care | Payer: 59 | Source: Ambulatory Visit | Attending: Internal Medicine | Admitting: Internal Medicine

## 2022-09-15 ENCOUNTER — Encounter (HOSPITAL_COMMUNITY): Payer: Self-pay | Admitting: Internal Medicine

## 2022-09-15 VITALS — BP 150/90 | HR 72 | Wt 228.4 lb

## 2022-09-15 DIAGNOSIS — G4733 Obstructive sleep apnea (adult) (pediatric): Secondary | ICD-10-CM

## 2022-09-15 DIAGNOSIS — F17210 Nicotine dependence, cigarettes, uncomplicated: Secondary | ICD-10-CM | POA: Insufficient documentation

## 2022-09-15 DIAGNOSIS — I2510 Atherosclerotic heart disease of native coronary artery without angina pectoris: Secondary | ICD-10-CM | POA: Insufficient documentation

## 2022-09-15 DIAGNOSIS — I5022 Chronic systolic (congestive) heart failure: Secondary | ICD-10-CM | POA: Diagnosis not present

## 2022-09-15 DIAGNOSIS — I428 Other cardiomyopathies: Secondary | ICD-10-CM | POA: Diagnosis not present

## 2022-09-15 DIAGNOSIS — E785 Hyperlipidemia, unspecified: Secondary | ICD-10-CM | POA: Insufficient documentation

## 2022-09-15 DIAGNOSIS — I493 Ventricular premature depolarization: Secondary | ICD-10-CM | POA: Insufficient documentation

## 2022-09-15 DIAGNOSIS — I110 Hypertensive heart disease with heart failure: Secondary | ICD-10-CM | POA: Diagnosis not present

## 2022-09-15 DIAGNOSIS — Z006 Encounter for examination for normal comparison and control in clinical research program: Secondary | ICD-10-CM

## 2022-09-15 DIAGNOSIS — I10 Essential (primary) hypertension: Secondary | ICD-10-CM | POA: Diagnosis not present

## 2022-09-15 DIAGNOSIS — I472 Ventricular tachycardia, unspecified: Secondary | ICD-10-CM | POA: Diagnosis not present

## 2022-09-15 DIAGNOSIS — Z7984 Long term (current) use of oral hypoglycemic drugs: Secondary | ICD-10-CM | POA: Diagnosis not present

## 2022-09-15 DIAGNOSIS — Z79899 Other long term (current) drug therapy: Secondary | ICD-10-CM | POA: Diagnosis not present

## 2022-09-15 LAB — BASIC METABOLIC PANEL
Anion gap: 8 (ref 5–15)
BUN: 10 mg/dL (ref 6–20)
CO2: 23 mmol/L (ref 22–32)
Calcium: 8.8 mg/dL — ABNORMAL LOW (ref 8.9–10.3)
Chloride: 108 mmol/L (ref 98–111)
Creatinine, Ser: 1.06 mg/dL (ref 0.61–1.24)
GFR, Estimated: >60 mL/min (ref >60)
Glucose, Bld: 99 mg/dL (ref 70–99)
Potassium: 3.6 mmol/L (ref 3.5–5.1)
Sodium: 139 mmol/L (ref 135–145)

## 2022-09-15 LAB — CBC
HCT: 43.9 % (ref 39.0–52.0)
Hemoglobin: 14.3 g/dL (ref 13.0–17.0)
MCH: 27.2 pg (ref 26.0–34.0)
MCHC: 32.6 g/dL (ref 30.0–36.0)
MCV: 83.6 fL (ref 80.0–100.0)
Platelets: 208 10*3/uL (ref 150–400)
RBC: 5.25 MIL/uL (ref 4.22–5.81)
RDW: 15.1 % (ref 11.5–15.5)
WBC: 7.7 10*3/uL (ref 4.0–10.5)
nRBC: 0 % (ref 0.0–0.2)

## 2022-09-15 LAB — BRAIN NATRIURETIC PEPTIDE: B Natriuretic Peptide: 310.9 pg/mL — ABNORMAL HIGH (ref 0.0–100.0)

## 2022-09-15 MED ORDER — CARVEDILOL 12.5 MG PO TABS: 12.5000 mg | ORAL_TABLET | Freq: Two times a day (BID) | ORAL | 3 refills | Status: DC

## 2022-09-15 MED ORDER — ASPIRIN 81 MG PO TBEC: 81.0000 mg | DELAYED_RELEASE_TABLET | Freq: Every day | ORAL | 3 refills | Status: AC

## 2022-09-15 NOTE — Progress Notes (Cosign Needed)
ADVANCED HF CLINIC NOTE   Primary Care: [name], MD HF Cardiologist: Dr. Gala Romney  HPI: Mr Wamser is a 56 y.o. with h/o OA, S/P R and L hip replacement 2022, HTN, HLD, and systolic heart failure due to NICM.  Admitted 5/23 with new acute CHF. Echo EF 25-30%, RV normal, Grade II DD. Diuresed with IV lasix with brisk diuresis.  R/LHC showed with mild nonobstructive CAD, low filling pressures with moderate to severely reduced CO. Cardiac MRI: LVEF 21%, RVEF 35%, mid-myocardial LGE basal septum and mid inferior wall. Felt CM possible prior viral myocarditis vs cardiac sarcoidosis.GDMT started, mexiletine added for PVC suppression. He was discharged home, weight 244.6 lbs.  He is here today for HF follow-up. Has been doing well. 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 He walks 2.5 miles in am and pm without limitation. No dyspnea, orthopnea, PND or lower extremity edema. Has not needed PRN lasix recently. Has lost 19 lb recently with lifestyle changes. Compliant with all medications.    Cardiac Studies: - cMRI (5/23):  LVEF 21%, RVEF 35%, mid-myocardial LGE basal septum and mid inferior wall.   - R/LHC (5/23): minimal CAD.    Ao = 83/65 (71) LV = 98/9 RA = 2 RV = 20/7 PA = 20/12 (15) PCW = 5 Fick cardiac output/index = 4.1/1.9 PVR = 2.4 WU SVR = 1,244 Ao sat = 95% PA sat = 60%, 61%  - Echo (5/23): EF 25-30%, RV okay.  ROS: All systems reviewed and negative except as per HPI.   Past Medical History:  Diagnosis Date   Arthritis    Dry skin    on chest using goldbond lotion on   Hypertension    OA (osteoarthritis)    Sciatic nerve pain right comes and goes   Current Outpatient Medications  Medication Sig Dispense Refill   acetaminophen (TYLENOL) 325 MG tablet Take 325 mg by mouth every 6 (six) hours as needed for moderate pain or headache.     atorvastatin (LIPITOR) 40 MG tablet Take 1 tablet (40 mg total) by mouth daily. 90 tablet 3    carvedilol (COREG) 6.25 MG tablet Take 1 tablet (6.25 mg total) by mouth 2 (two) times daily with a meal. 180 tablet 3   dapagliflozin propanediol (FARXIGA) 10 MG TABS tablet Take 1 tablet (10 mg total) by mouth daily. 90 tablet 3   diphenhydrAMINE (BENADRYL) 25 MG tablet Take 25 mg by mouth every 6 (six) hours as needed for allergies.     furosemide (LASIX) 20 MG tablet Take 1 tablet (20 mg total) by mouth daily as needed for edema or fluid (as needed for shortness of breath, leg swelling or weight gain 3 lbs in 24 hrs or 5 lbs in 7 days.). 90 tablet 1   Hypromellose (ALZAIR ALLERGY NASAL SPRAY NA) Place 1 spray into both nostrils at bedtime.     mexiletine (MEXITIL) 150 MG capsule Take 2 capsules (300 mg total) by mouth 2 (two) times daily. 360 capsule 5   sacubitril-valsartan (ENTRESTO) 97-103 MG Take 1 tablet by mouth 2 (two) times daily. 60 tablet 6   spironolactone (ALDACTONE) 25 MG tablet Take 1 tablet (25 mg total) by mouth daily. 90 tablet 3   No current facility-administered medications for this encounter.   No Known Allergies  Social History   Socioeconomic History   Marital status: Divorced    Spouse name: Not on file   Number of children: Not on file  Years of education: Not on file   Highest education level: Not on file  Occupational History   Not on file  Tobacco Use   Smoking status: Every Day    Packs/day: 0.50    Years: 37.00    Total pack years: 18.50    Types: Cigarettes   Smokeless tobacco: Never  Vaping Use   Vaping Use: Never used  Substance and Sexual Activity   Alcohol use: Never   Drug use: Never   Sexual activity: Not on file  Other Topics Concern   Not on file  Social History Narrative   Not on file   Social Determinants of Health   Financial Resource Strain: Medium Risk (05/31/2022)   Overall Financial Resource Strain (CARDIA)    Difficulty of Paying Living Expenses: Somewhat hard  Food Insecurity: No Food Insecurity (05/31/2022)   Hunger Vital  Sign    Worried About Running Out of Food in the Last Year: Never true    Ran Out of Food in the Last Year: Never true  Transportation Needs: Not on file  Physical Activity: Not on file  Stress: Not on file  Social Connections: Not on file  Intimate Partner Violence: Not on file   Family History  Problem Relation Age of Onset   Heart failure Father    Heart attack Brother    BP (!) 150/90   Pulse 72   Wt 103.6 kg (228 lb 6.4 oz)   SpO2 97%   BMI 34.73 kg/m   Wt Readings from Last 3 Encounters:  09/15/22 103.6 kg (228 lb 6.4 oz)  08/06/22 108.9 kg (240 lb)  07/12/22 112 kg (247 lb)   PHYSICAL EXAM: General:  Well appearing.  HEENT: normal Neck: supple. no JVD. Carotids 2+ bilat; no bruits.  Cor: PMI nondisplaced. Regular rate & rhythm. No rubs, gallops or murmurs. Lungs: clear Abdomen: soft, nontender, nondistended.  Extremities: no cyanosis, clubbing, rash, edema Neuro: alert & orientedx3, cranial nerves grossly intact. moves all 4 extremities w/o difficulty. Affect pleasant   ECG (personally reviewed): SR 75 bpm  ASSESSMENT & PLAN: Chronic Systolic Heart Failure: - Echo (5/23): EF 25-30%, RV okay. Nonischemic cardiomyopathy.  - R/LHC (5/23): with mild nonobstructive CAD, low filling pressures with moderate to severely reduced CO.  - Cardiac MRI (5/23): LVEF 21%, RVEF 35%, mid-myocardial LGE basal septum and mid inferior wall, ECV elevated mid inferoseptal (43%) and severely elevated basal inferoseptal (63%) - Echo 09/08/22: EF 40-45%, RV okay - Dr. Haroldine Laws reviewed cMRI from May with our partner, Dr. Gardiner Rhyme. 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 Will arrange for cardiac PET at [facility] - NYHA II, volume looks good today. - Continue Entresto to 97/103 mg bid.  - Continue Lasix 20 mg daily PRN. - Continue spironolactone 25 mg daily.  - Continue Farxiga 10 mg daily.  - Increase Coreg to 12.5 mg BID - Labs today   2. HTN  - BP elevated today -  Increased Coreg as above   3. HLD - Continue atorvastatin.    4. Tobacco Abuse - Quit after recent hospitalization. Congratulated.   5. PVCs/NSVT  - Multifocal PVCs.  - Continue mexiletine 300 mg bid  - No ectopy on ECG today - Treat OSA   6. Non-obstructive CAD - Very mild.  - No chest pain. - Continue statin/ASA  7. OSA - Severe, AHI 44/hr (6/23). - Recently completed in-lab CPAP titration   Follow up 2-3 months after cardiac PET completed  Marlyce Huge, PA-C  09/15/22  Patient seen and examined with the above-signed Advanced Practice Provider and/or Housestaff. I personally reviewed laboratory data, imaging studies and relevant notes. I independently examined the patient and formulated the important aspects of the plan. I have edited the note to reflect any of my changes or salient points. I have personally discussed the plan with the patient and/or family.  Feeling much better. NYHA II. No edema, orthopnea or PND. Echo last week with improving EF. No palpitations, syncope or presyncope. Quit smoking.Getting treated for OSA.   General:  Well appearing. No resp difficulty HEENT: normal Neck: supple. no JVD. Carotids 2+ bilat; no bruits. No lymphadenopathy or thryomegaly appreciated. Cor: PMI nondisplaced. Regular rate & rhythm. No rubs, gallops or murmurs. Lungs: clear Abdomen: soft, nontender, nondistended. No hepatosplenomegaly. No bruits or masses. Good bowel sounds. Extremities: no cyanosis, clubbing, rash, edema Neuro: alert & orientedx3, cranial nerves grossly intact. moves all 4 extremities w/o difficulty. Affect pleasant  His EF is improving with medical therapy. I reviewed his cMRI personally with Dr. Bjorn Pippin and concern remains for sarcoid. Fortunately rhythm has been stable and EF improving with GDMT. CXR is mildly abnormal as well. Will proceed with cardiac PET to further evaluate. Increase b-blocker. Check labs today.   Arvilla Meres, MD  6:37 PM

## 2022-09-15 NOTE — Research (Addendum)
ANALOG Informed Consent   Subject Name: Alan Coffey  Subject met inclusion and exclusion criteria.  The informed consent form, study requirements and expectations were reviewed with the subject and questions and concerns were addressed prior to the signing of the consent form.  The subject verbalized understanding of the trial requirements.  The subject agreed to participate in the ANALOG trial and signed the informed consent at 15::52 on 09-22-203.  The informed consent was obtained prior to performance of any protocol-specific procedures for the subject.  A copy of the signed informed consent was given to the subject and a copy was placed in the subject's medical record.   [place] Kyna Blahnik, RESEARCH COORDINATOR 09/15/2022

## 2022-09-15 NOTE — Patient Instructions (Signed)
INCREASE  Carvedilol to 12.5 mg Twice daily  Labs done today, your results will be available in MyChart, we will contact you for abnormal readings.  Your physician has requested that you have a cardiac MRI. Cardiac MRI uses a computer to create images of your heart as its beating, producing both still and moving pictures of your heart and major blood vessels. For further information please visit [email or web address] Please follow the instruction sheet given to you today for more information. ONCE APPROVED BY YOUR INSURANCE YOU WILL BE CALLED TO ARRANGE THE TEST.   Your physician recommends that you schedule a follow-up appointment in: 4 months ( January 2024)  **please call the office in November to arrange your follow up appointment **  If you have any questions or concerns before your next appointment please send [place] a message through [place] or call our office at [phone].    TO LEAVE A MESSAGE FOR THE NURSE SELECT OPTION 2, PLEASE LEAVE A MESSAGE INCLUDING: YOUR NAME DATE OF BIRTH CALL BACK NUMBER REASON FOR CALL**this is important as we prioritize the call backs  YOU WILL RECEIVE A CALL BACK THE SAME DAY AS LONG AS YOU CALL BEFORE 4:00 PM  At the [facility], you and your health needs are our priority. As part of our continuing mission to provide you with exceptional heart care, we have created designated Provider Care Teams. These Care Teams include your primary Cardiologist (physician) and Advanced Practice Providers (APPs- Physician Assistants and Nurse Practitioners) who all work together to provide you with the care you need, when you need it.   You may see any of the following providers on your designated Care Team at your next follow up: Dr Glori Bickers Dr Loralie Champagne Dr. Roxana Hires, NP Lyda Jester, [place] [facility] [place], [place] Forestine Na, NP Audry Riles, PharmD   Please be sure to bring in all your medications  bottles to every appointment.

## 2022-10-04 ENCOUNTER — Telehealth (HOSPITAL_COMMUNITY): Payer: Self-pay | Admitting: *Deleted

## 2022-10-04 NOTE — Telephone Encounter (Signed)
Cardiac pet auth request faxed to [employer]

## 2022-10-09 ENCOUNTER — Telehealth (HOSPITAL_COMMUNITY): Payer: Self-pay

## 2022-10-09 ENCOUNTER — Telehealth (HOSPITAL_COMMUNITY): Payer: Self-pay | Admitting: *Deleted

## 2022-10-09 NOTE — Telephone Encounter (Signed)
PET SCAN Forms faxed to [facility]

## 2022-10-09 NOTE — Telephone Encounter (Signed)
Pet Scan ([facility]) approved auth # [id number] exp. 04/03/23

## 2022-11-10 DIAGNOSIS — E1169 Type 2 diabetes mellitus with other specified complication: Secondary | ICD-10-CM | POA: Diagnosis not present

## 2022-11-10 DIAGNOSIS — I509 Heart failure, unspecified: Secondary | ICD-10-CM | POA: Diagnosis not present

## 2022-11-10 DIAGNOSIS — N62 Hypertrophy of breast: Secondary | ICD-10-CM | POA: Diagnosis not present

## 2022-11-10 DIAGNOSIS — Z0001 Encounter for general adult medical examination with abnormal findings: Secondary | ICD-10-CM | POA: Diagnosis not present

## 2022-11-10 DIAGNOSIS — N6320 Unspecified lump in the left breast, unspecified quadrant: Secondary | ICD-10-CM | POA: Diagnosis not present

## 2022-11-10 DIAGNOSIS — I10 Essential (primary) hypertension: Secondary | ICD-10-CM | POA: Diagnosis not present

## 2022-11-10 DIAGNOSIS — E782 Mixed hyperlipidemia: Secondary | ICD-10-CM | POA: Diagnosis not present

## 2022-11-10 DIAGNOSIS — N6310 Unspecified lump in the right breast, unspecified quadrant: Secondary | ICD-10-CM | POA: Diagnosis not present

## 2022-11-17 DIAGNOSIS — E782 Mixed hyperlipidemia: Secondary | ICD-10-CM | POA: Diagnosis not present

## 2022-11-17 DIAGNOSIS — E559 Vitamin D deficiency, unspecified: Secondary | ICD-10-CM | POA: Diagnosis not present

## 2022-11-17 DIAGNOSIS — Z79899 Other long term (current) drug therapy: Secondary | ICD-10-CM | POA: Diagnosis not present

## 2022-11-17 DIAGNOSIS — E1159 Type 2 diabetes mellitus with other circulatory complications: Secondary | ICD-10-CM | POA: Diagnosis not present

## 2022-11-23 ENCOUNTER — Other Ambulatory Visit (HOSPITAL_COMMUNITY): Payer: Self-pay | Admitting: *Deleted

## 2022-11-30 ENCOUNTER — Other Ambulatory Visit: Payer: Self-pay | Admitting: Family Medicine

## 2022-11-30 ENCOUNTER — Telehealth (HOSPITAL_COMMUNITY): Payer: Self-pay | Admitting: *Deleted

## 2022-11-30 DIAGNOSIS — N6320 Unspecified lump in the left breast, unspecified quadrant: Secondary | ICD-10-CM

## 2022-11-30 NOTE — Telephone Encounter (Signed)
Cmri auth request faxed to [employer]

## 2022-12-13 ENCOUNTER — Telehealth (HOSPITAL_COMMUNITY): Payer: Self-pay | Admitting: *Deleted

## 2022-12-13 NOTE — Telephone Encounter (Signed)
Reaching out to patient to offer assistance regarding upcoming cardiac imaging study; pt verbalizes understanding of appt date/time, parking situation and where to check in, , and verified current allergies; name and call back number provided for further questions should they arise  Alan Brick RN Navigator Cardiac Imaging [facility] [phone] office [phone] cell  Patient has had a cardiac MRI without incident.

## 2022-12-14 ENCOUNTER — Ambulatory Visit: Admission: RE | Admit: 2022-12-14 | Payer: 59 | Source: Ambulatory Visit

## 2022-12-14 ENCOUNTER — Ambulatory Visit
Admission: RE | Admit: 2022-12-14 | Discharge: 2022-12-14 | Disposition: A | Discharge status: Home / Self Care | Payer: 59 | Source: Ambulatory Visit | Attending: Family Medicine | Admitting: Family Medicine

## 2022-12-14 ENCOUNTER — Ambulatory Visit (HOSPITAL_COMMUNITY)
Admission: RE | Admit: 2022-12-14 | Discharge: 2022-12-14 | Disposition: A | Discharge status: Home / Self Care | Payer: 59 | Source: Ambulatory Visit | Attending: Internal Medicine | Admitting: Internal Medicine

## 2022-12-14 ENCOUNTER — Encounter (HOSPITAL_COMMUNITY): Payer: Self-pay

## 2022-12-14 DIAGNOSIS — N6320 Unspecified lump in the left breast, unspecified quadrant: Secondary | ICD-10-CM

## 2022-12-14 DIAGNOSIS — I5022 Chronic systolic (congestive) heart failure: Secondary | ICD-10-CM

## 2022-12-14 DIAGNOSIS — N62 Hypertrophy of breast: Secondary | ICD-10-CM | POA: Diagnosis not present

## 2022-12-21 ENCOUNTER — Other Ambulatory Visit: Payer: Self-pay

## 2023-01-04 ENCOUNTER — Other Ambulatory Visit (HOSPITAL_COMMUNITY): Payer: Self-pay | Admitting: Cardiology

## 2023-01-31 ENCOUNTER — Telehealth (HOSPITAL_COMMUNITY): Payer: Self-pay | Admitting: *Deleted

## 2023-01-31 NOTE — Telephone Encounter (Signed)
Pt left vm requesting results from PET scan at [facility]. I do not see any results in his chart.   Routed to [place]

## 2023-02-06 NOTE — Telephone Encounter (Signed)
Cardiac PET results:  FINAL COMMENTS [facility].  Cardiac PET/CT metabolic study with [id number] FDG and scanned on PET [facility]: 1. There is no abnormal metabolism to indicate the presence of an active inflammatory process in the left ventricular      myocardium. 2. There is no abnormal metabolism in the right ventricle. 3. There is no evidence to suggest active cardiac sarcoidosis.   Pt aware

## 2023-02-12 ENCOUNTER — Encounter: Payer: 59 | Admitting: *Deleted

## 2023-02-12 DIAGNOSIS — Z006 Encounter for examination for normal comparison and control in clinical research program: Secondary | ICD-10-CM

## 2023-02-12 NOTE — Research (Signed)
Patient came in today to pick up new ANALOG device per the sponsor.     [place] Seif Teichert, Research Coordinator 02/12/2023  10:27 am

## 2023-03-04 DIAGNOSIS — I2510 Atherosclerotic heart disease of native coronary artery without angina pectoris: Secondary | ICD-10-CM | POA: Diagnosis not present

## 2023-03-04 DIAGNOSIS — I110 Hypertensive heart disease with heart failure: Secondary | ICD-10-CM | POA: Diagnosis not present

## 2023-03-04 DIAGNOSIS — Z8249 Family history of ischemic heart disease and other diseases of the circulatory system: Secondary | ICD-10-CM | POA: Diagnosis not present

## 2023-03-04 DIAGNOSIS — Z7982 Long term (current) use of aspirin: Secondary | ICD-10-CM | POA: Diagnosis not present

## 2023-03-04 DIAGNOSIS — E1142 Type 2 diabetes mellitus with diabetic polyneuropathy: Secondary | ICD-10-CM | POA: Diagnosis not present

## 2023-03-04 DIAGNOSIS — Z7984 Long term (current) use of oral hypoglycemic drugs: Secondary | ICD-10-CM | POA: Diagnosis not present

## 2023-03-04 DIAGNOSIS — G4733 Obstructive sleep apnea (adult) (pediatric): Secondary | ICD-10-CM | POA: Diagnosis not present

## 2023-03-04 DIAGNOSIS — Z87891 Personal history of nicotine dependence: Secondary | ICD-10-CM | POA: Diagnosis not present

## 2023-03-04 DIAGNOSIS — I509 Heart failure, unspecified: Secondary | ICD-10-CM | POA: Diagnosis not present

## 2023-03-04 DIAGNOSIS — Z833 Family history of diabetes mellitus: Secondary | ICD-10-CM | POA: Diagnosis not present

## 2023-03-04 DIAGNOSIS — E785 Hyperlipidemia, unspecified: Secondary | ICD-10-CM | POA: Diagnosis not present

## 2023-03-04 DIAGNOSIS — N529 Male erectile dysfunction, unspecified: Secondary | ICD-10-CM | POA: Diagnosis not present

## 2023-03-09 ENCOUNTER — Encounter: Payer: 59 | Admitting: *Deleted

## 2023-03-09 ENCOUNTER — Other Ambulatory Visit (HOSPITAL_COMMUNITY): Payer: Self-pay | Admitting: Internal Medicine

## 2023-03-09 ENCOUNTER — Ambulatory Visit (HOSPITAL_COMMUNITY)
Admission: RE | Admit: 2023-03-09 | Discharge: 2023-03-09 | Disposition: A | Discharge status: Home / Self Care | Payer: 59 | Source: Ambulatory Visit | Attending: Internal Medicine | Admitting: Internal Medicine

## 2023-03-09 VITALS — BP 148/85 | HR 59 | Wt 237.0 lb

## 2023-03-09 DIAGNOSIS — I110 Hypertensive heart disease with heart failure: Secondary | ICD-10-CM | POA: Insufficient documentation

## 2023-03-09 DIAGNOSIS — N62 Hypertrophy of breast: Secondary | ICD-10-CM | POA: Diagnosis not present

## 2023-03-09 DIAGNOSIS — I428 Other cardiomyopathies: Secondary | ICD-10-CM | POA: Diagnosis not present

## 2023-03-09 DIAGNOSIS — I5022 Chronic systolic (congestive) heart failure: Secondary | ICD-10-CM | POA: Insufficient documentation

## 2023-03-09 DIAGNOSIS — I472 Ventricular tachycardia, unspecified: Secondary | ICD-10-CM | POA: Insufficient documentation

## 2023-03-09 DIAGNOSIS — Z87891 Personal history of nicotine dependence: Secondary | ICD-10-CM | POA: Diagnosis not present

## 2023-03-09 DIAGNOSIS — I493 Ventricular premature depolarization: Secondary | ICD-10-CM

## 2023-03-09 DIAGNOSIS — Z7982 Long term (current) use of aspirin: Secondary | ICD-10-CM | POA: Insufficient documentation

## 2023-03-09 DIAGNOSIS — Z7984 Long term (current) use of oral hypoglycemic drugs: Secondary | ICD-10-CM | POA: Insufficient documentation

## 2023-03-09 DIAGNOSIS — M1990 Unspecified osteoarthritis, unspecified site: Secondary | ICD-10-CM | POA: Insufficient documentation

## 2023-03-09 DIAGNOSIS — I2510 Atherosclerotic heart disease of native coronary artery without angina pectoris: Secondary | ICD-10-CM | POA: Insufficient documentation

## 2023-03-09 DIAGNOSIS — G4733 Obstructive sleep apnea (adult) (pediatric): Secondary | ICD-10-CM | POA: Insufficient documentation

## 2023-03-09 DIAGNOSIS — E785 Hyperlipidemia, unspecified: Secondary | ICD-10-CM | POA: Diagnosis not present

## 2023-03-09 DIAGNOSIS — Z79899 Other long term (current) drug therapy: Secondary | ICD-10-CM | POA: Insufficient documentation

## 2023-03-09 DIAGNOSIS — Z006 Encounter for examination for normal comparison and control in clinical research program: Secondary | ICD-10-CM

## 2023-03-09 LAB — BASIC METABOLIC PANEL
Anion gap: 9 (ref 5–15)
BUN: 17 mg/dL (ref 6–20)
CO2: 23 mmol/L (ref 22–32)
Calcium: 9 mg/dL (ref 8.9–10.3)
Chloride: 103 mmol/L (ref 98–111)
Creatinine, Ser: 1.1 mg/dL (ref 0.61–1.24)
GFR, Estimated: >60 mL/min (ref >60)
Glucose, Bld: 96 mg/dL (ref 70–99)
Potassium: 4 mmol/L (ref 3.5–5.1)
Sodium: 135 mmol/L (ref 135–145)

## 2023-03-09 LAB — BRAIN NATRIURETIC PEPTIDE: B Natriuretic Peptide: 117.5 pg/mL — ABNORMAL HIGH (ref 0.0–100.0)

## 2023-03-09 MED ORDER — EPLERENONE 25 MG PO TABS: 25.0000 mg | ORAL_TABLET | Freq: Every day | ORAL | 3 refills | Status: DC

## 2023-03-09 MED ORDER — AMLODIPINE BESYLATE 5 MG PO TABS: 5.0000 mg | ORAL_TABLET | Freq: Every day | ORAL | 3 refills | Status: DC

## 2023-03-09 NOTE — Progress Notes (Signed)
ADVANCED HF CLINIC NOTE   Primary Care: Osa Craver, NP HF Cardiologist: Dr. Haroldine Laws  HPI: Alan Coffey is a 57 y.o. with h/o OA, S/P R and L hip replacement 2022, HTN, HLD, and systolic heart failure due to NICM.  Admitted 5/23 with new acute CHF. Echo EF 25-30%, RV normal, Grade II DD. Diuresed with IV lasix with brisk diuresis.  R/LHC showed with mild nonobstructive CAD, low filling pressures with moderate to severely reduced CO. Cardiac MRI: LVEF 21%, RVEF 35%, mid-myocardial LGE basal septum and mid inferior wall. Felt CM possible prior viral myocarditis vs cardiac sarcoidosis.GDMT started, mexiletine added for PVC suppression. He was discharged home, weight 244.6 lbs.  PET scan 1/24 at [facility] EF 42% no sarcoid   He is here today for HF follow-up. Doing well. Walks 4.5 miles per day. No CP or SOB. Having painful gynecomastia. No edema, orthopnea or PND.   Cardiac Studies: - cMRI (5/23):  LVEF 21%, RVEF 35%, mid-myocardial LGE basal septum and mid inferior wall.   - R/LHC (5/23): minimal CAD.    Ao = 83/65 (71) LV = 98/9 RA = 2 RV = 20/7 PA = 20/12 (15) PCW = 5 Fick cardiac output/index = 4.1/1.9 PVR = 2.4 WU SVR = 1,244 Ao sat = 95% PA sat = 60%, 61%  - Echo (5/23): EF 25-30%, RV okay.  ROS: All systems reviewed and negative except as per HPI.   Past Medical History:  Diagnosis Date   Arthritis    Dry skin    on chest using goldbond lotion on   Hypertension    OA (osteoarthritis)    Sciatic nerve pain right comes and goes   Current Outpatient Medications  Medication Sig Dispense Refill   acetaminophen (TYLENOL) 325 MG tablet Take 325 mg by mouth every 6 (six) hours as needed for moderate pain or headache.     aspirin EC 81 MG tablet Take 1 tablet (81 mg total) by mouth daily. Swallow whole. 90 tablet 3   atorvastatin (LIPITOR) 40 MG tablet Take 1 tablet (40 mg total) by mouth daily. 90 tablet 3   carvedilol (COREG) 12.5 MG tablet Take 1 tablet (12.5 mg  total) by mouth 2 (two) times daily with a meal. 180 tablet 3   dapagliflozin propanediol (FARXIGA) 10 MG TABS tablet Take 1 tablet (10 mg total) by mouth daily. 90 tablet 3   diphenhydrAMINE (BENADRYL) 25 MG tablet Take 25 mg by mouth every 6 (six) hours as needed for allergies.     furosemide (LASIX) 20 MG tablet TAKE 1 TABLET EVERY DAY AS NEEDED FOR EDEMA OR FLUID 30 tablet 2   Hypromellose (ALZAIR ALLERGY NASAL SPRAY NA) Place 1 spray into both nostrils at bedtime.     mexiletine (MEXITIL) 150 MG capsule Take 2 capsules (300 mg total) by mouth 2 (two) times daily. 360 capsule 5   sacubitril-valsartan (ENTRESTO) 97-103 MG Take 1 tablet by mouth 2 (two) times daily. 60 tablet 6   spironolactone (ALDACTONE) 25 MG tablet Take 1 tablet (25 mg total) by mouth daily. 90 tablet 3   No current facility-administered medications for this encounter.   No Known Allergies  Social History   Socioeconomic History   Marital status: Divorced    Spouse name: Not on file   Number of children: Not on file   Years of education: Not on file   Highest education level: Not on file  Occupational History   Not on file  Tobacco Use  Smoking status: Every Day    Packs/day: 0.50    Years: 37.00    Additional pack years: 0.00    Total pack years: 18.50    Types: Cigarettes   Smokeless tobacco: Never  Vaping Use   Vaping Use: Never used  Substance and Sexual Activity   Alcohol use: Never   Drug use: Never   Sexual activity: Not on file  Other Topics Concern   Not on file  Social History Narrative   Not on file   Social Determinants of Health   Financial Resource Strain: Medium Risk (05/31/2022)   Overall Financial Resource Strain (CARDIA)    Difficulty of Paying Living Expenses: Somewhat hard  Food Insecurity: No Food Insecurity (05/31/2022)   Hunger Vital Sign    Worried About Running Out of Food in the Last Year: Never true    Ran Out of Food in the Last Year: Never true  Transportation Needs:  Not on file  Physical Activity: Not on file  Stress: Not on file  Social Connections: Not on file  Intimate Partner Violence: Not on file   Family History  Problem Relation Age of Onset   Heart failure Father    Heart attack Brother    BP (!) 148/85   Pulse (!) 59   Wt 107.5 kg (237 lb)   SpO2 96%   BMI 36.04 kg/m   Wt Readings from Last 3 Encounters:  03/09/23 107.5 kg (237 lb)  09/15/22 103.6 kg (228 lb 6.4 oz)  08/06/22 108.9 kg (240 lb)   PHYSICAL EXAM: General:  Well appearing. No resp difficulty HEENT: normal Neck: supple. no JVD. Carotids 2+ bilat; no bruits. No lymphadenopathy or thryomegaly appreciated. Cor: PMI nondisplaced. Regular rate & rhythm. No rubs, gallops or murmurs. Lungs: clear Abdomen: soft, nontender, nondistended. No hepatosplenomegaly. No bruits or masses. Good bowel sounds. Extremities: no cyanosis, clubbing, rash, edema Neuro: alert & orientedx3, cranial nerves grossly intact. moves all 4 extremities w/o difficulty. Affect pleasant   ASSESSMENT & PLAN:  Chronic Systolic Heart Failure: - Echo (5/23): EF 25-30%, RV okay. Nonischemic cardiomyopathy.  - R/LHC (5/23): with mild nonobstructive CAD, low filling pressures with moderate to severely reduced CO.  - Cardiac MRI (5/23): LVEF 21%, RVEF 35%, mid-myocardial LGE basal septum and mid inferior wall, ECV elevated mid inferoseptal (43%) and severely elevated basal inferoseptal (63%) possible sarcoidosis - Echo 09/08/22: EF 40-45%, RV okay - PET scan 1/24 at [facility] EF 42% no sarcoid. /. If MRI related to myocarditis  - NYHA I volume ok  - Continue Entresto to 97/103 mg bid.  - Continue Lasix 20 mg daily PRN. - Having painful gynecomastia switch spironolactone 25 mg daily to eplerenone 25 daily - Continue Farxiga 10 mg daily.  - Continue carvedilol 12.5 mg BID - Labs today   2. HTN  -Blood pressure up. Add amlodipine 5 daily   3. HLD - Continue atorvastatin.  - PCP following lipids   4.  Tobacco Abuse - Remains quit   5. PVCs/NSVT  - Multifocal PVCs.  - Continue mexiletine 300 mg bid  - No ectopy on ECG today - Treat OSA   6. Non-obstructive CAD - Very mild.  - No s/s angina - Continue statin/ASA  7. OSA - Severe, AHI 44/hr (6/23). - Has not started CPAP due to cost - Encouraged him to get CPAP, as soon as he can.   Glori Bickers, MD  3:40 PM

## 2023-03-09 NOTE — Patient Instructions (Addendum)
STOP Spironolactone   START Elperonone 25 mg daily  START Amlodipine 5 mg daily  Labs done today, your results will be available in MyChart, we will contact you for abnormal readings.  Your provider has recommended that  you wear a Zio Patch for 14 days.  This monitor will record your heart rhythm for our review.  IF you have any symptoms while wearing the monitor please press the button.  If you have any issues with the patch or you notice a red or orange light on it please call the company at [phone].  Once you remove the patch please mail it back to the company as soon as possible so we can get the results.  Your physician recommends that you schedule a follow-up appointment in: 4 months (July ) ** please call the office in [place] to arrange your follow up appointment. **  If you have any questions or concerns before your next appointment please send [place] a message through [place] or call our office at [phone].    TO LEAVE A MESSAGE FOR THE NURSE SELECT OPTION 2, PLEASE LEAVE A MESSAGE INCLUDING: YOUR NAME DATE OF BIRTH CALL BACK NUMBER REASON FOR CALL**this is important as we prioritize the call backs  YOU WILL RECEIVE A CALL BACK THE SAME DAY AS LONG AS YOU CALL BEFORE 4:00 PM  At the [facility], you and your health needs are our priority. As part of our continuing mission to provide you with exceptional heart care, we have created designated Provider Care Teams. These Care Teams include your primary Cardiologist (physician) and Advanced Practice Providers (APPs- Physician Assistants and Nurse Practitioners) who all work together to provide you with the care you need, when you need it.   You may see any of the following providers on your designated Care Team at your next follow up: Dr Glori Bickers Dr Loralie Champagne Dr. Roxana Hires, NP Lyda Jester, [place] [facility] [place], [place] Forestine Na, NP Audry Riles,  PharmD   Please be sure to bring in all your medications bottles to every appointment.    Thank you for choosing [facility]

## 2023-03-09 NOTE — Research (Signed)
Patient came in today for his final visit for the ANALOG study. Finished questionnaire and turned in device.    [place] Kalenna Millett, Research Coordinator  03-09-2023 16:19

## 2023-03-29 DIAGNOSIS — I493 Ventricular premature depolarization: Secondary | ICD-10-CM | POA: Diagnosis not present

## 2023-03-30 NOTE — Addendum Note (Signed)
Encounter addended by: Crissie Figures, RN on: 03/30/2023 9:28 AM  Actions taken: Imaging Exam ended

## 2023-03-31 ENCOUNTER — Other Ambulatory Visit (HOSPITAL_COMMUNITY): Payer: Self-pay | Admitting: Cardiology

## 2023-04-02 ENCOUNTER — Telehealth (HOSPITAL_COMMUNITY): Payer: Self-pay

## 2023-04-02 DIAGNOSIS — I493 Ventricular premature depolarization: Secondary | ICD-10-CM

## 2023-04-02 NOTE — Telephone Encounter (Signed)
-----   Message from Dolores Patty, MD sent at 04/01/2023  6:10 PM EDT ----- Very frequent PVCs (21%) despite mexilitene. Please make sure patient has followed up on CPAP use.   Please refer to Dr. Lalla Brothers in EP to discuss amio versus ablation.

## 2023-04-02 NOTE — Telephone Encounter (Signed)
Patient advised and verbalized understanding. Referral placed.   Orders Placed This Encounter  Procedures   Ambulatory referral to Cardiac Electrophysiology    Referral Priority:   Routine    Referral Type:   Consultation    Referral Reason:   Specialty Services Required    Referred to Provider:   Lanier Prude, MD    Requested Specialty:   Cardiology    Number of Visits Requested:   1

## 2023-05-01 ENCOUNTER — Other Ambulatory Visit (HOSPITAL_COMMUNITY): Payer: Self-pay | Admitting: Cardiology

## 2023-06-14 NOTE — Progress Notes (Signed)
Electrophysiology Office Note:    Date:  06/15/2023   ID:  Alan Coffey, DOB 1966/01/10, MRN [id number]  [facility] Cardiologist:  None  [facility] Electrophysiologist:  Lanier Prude, MD   Referring MD: Dolores Patty, MD   Chief Complaint: Frequent PVCs  History of Present Illness:    Alan Coffey is a 57 y.o. male who I am seeing today for an evaluation of frequent PVCs at the request of Dr. Gala Romney.  The patient last saw Dr. Gala Romney on March 09, 2023.  The patient has a history of osteoarthritis post bilateral hip replacements in 2022, hypertension, hyperlipidemia and nonischemic cardiomyopathy.  His heart failure was diagnosed in 2023 when he was admitted with an ejection fraction of 25%.  Heart cath was performed and showed nonobstructive coronary artery disease.  Cardiac MRI showed an ejection fraction of 21% with mid myocardial late gadolinium enhancement in the basal septum and mid inferior wall.  Mexiletine was added for PVC suppression.  He ultimately had a cardiac PET scan which was not consistent with a diagnosis of sarcoid.  At the appointment with Dr. Gala Romney in March, he was doing well and was staying active.  At that appointment, EKG did not show evidence of PVCs.  He wore a heart monitor after that appointment that resulted April 01, 2023 and showed 21% PVC burden.  He was referred to discuss management options for his PVCs.  Today he is doing well.  He walks 4 to 5 miles per day.  He feels better since changing to eplerenone with less chest tenderness.  He is tolerating his medical therapy.  No syncope or presyncope.  No swelling.       Their past medical, social and family history was reveiwed.   ROS:   Please see the history of present illness.    All other systems reviewed and are negative.  EKGs/Labs/Other Studies Reviewed:    The following studies were reviewed today:  March 09, 2023 EKG shows sinus rhythm with frequent  monomorphic PVCs.  PVCs have a steeply inferior axis and a precordial transition in V2.  EKG:  The ekg ordered today demonstrates sinus rhythm.  No PVCs.     Physical Exam:    VS:  BP (!) 128/94   Pulse 67   Ht 5\' 8"  (1.727 m)   Wt 245 lb (111.1 kg)   SpO2 94%   BMI 37.25 kg/m     Wt Readings from Last 3 Encounters:  06/15/23 245 lb (111.1 kg)  03/09/23 237 lb (107.5 kg)  09/15/22 228 lb 6.4 oz (103.6 kg)     GEN:  Well nourished, well developed in no acute distress CARDIAC: RRR, no murmurs, rubs, gallops.  No ectopy on prolonged auscultation. RESPIRATORY:  Clear to auscultation without rales, wheezing or rhonchi       ASSESSMENT AND PLAN:    1. PVC (premature ventricular contraction)   2. Chronic systolic heart failure (HCC)   3. Primary hypertension     #Frequent PVCs #Chronic systolic heart failure The patient has a significantly reduced left ventricular function and very frequent PVCs (greater than 20%) despite treatment with mexiletine.  His PVCs appear to be originating from the left ventricular outflow tract near the left coronary sinus of the aortic valve.  I discussed treatment options with the patient including antiarrhythmic drug therapy (amiodarone) versus catheter ablation.  Given his relatively young age, it is very reasonable to proceed with catheter ablation as a frontline  approach in an effort to avoid long-term exposure to amiodarone and its associated risks.    Interestingly, the patient's PVCs are not present today.  I walked the patient around the clinic and got his heart rate up to about 100 bpm and auscultated at peak heart rate and during recovery without any ectopy noted.  His EKG shows no ectopy today.  It is possible that his medical therapy has suppress the PVCs.  I went through the the options above in detail with the patient but for now I am going to repeat a ZIO monitor for 3 days to see with the burden of PVCs are during his usual activities.   I will plan to touch base with him in 6 months or sooner if the ZIO monitor shows heavy burden of PVCs.      #Hypertension At goal today.  Recommend checking blood pressures 1-2 times per week at home and recording the values.  Recommend bringing these recordings to the primary care physician.    Follow-up in 6 months or sooner based on ZIO monitor results.     Signed, Rossie Muskrat. Lalla Brothers, MD, [facility], [facility] 06/15/2023 9:29 AM    Electrophysiology [facility]

## 2023-06-15 ENCOUNTER — Ambulatory Visit: Payer: 59 | Attending: Cardiology | Admitting: Cardiology

## 2023-06-15 ENCOUNTER — Ambulatory Visit (INDEPENDENT_AMBULATORY_CARE_PROVIDER_SITE_OTHER): Payer: 59

## 2023-06-15 ENCOUNTER — Encounter: Payer: Self-pay | Admitting: Cardiology

## 2023-06-15 VITALS — BP 128/94 | HR 67 | Ht 68.0 in | Wt 245.0 lb

## 2023-06-15 DIAGNOSIS — I10 Essential (primary) hypertension: Secondary | ICD-10-CM

## 2023-06-15 DIAGNOSIS — I493 Ventricular premature depolarization: Secondary | ICD-10-CM | POA: Diagnosis not present

## 2023-06-15 DIAGNOSIS — I5022 Chronic systolic (congestive) heart failure: Secondary | ICD-10-CM

## 2023-06-15 MED ORDER — ENTRESTO 97-103 MG PO TABS: 1.0000 | ORAL_TABLET | Freq: Two times a day (BID) | ORAL | 3 refills | Status: DC

## 2023-06-15 NOTE — Patient Instructions (Addendum)
Medication Instructions:  Your physician recommends that you continue on your current medications as directed. Please refer to the Current Medication list given to you today.  *If you need a refill on your cardiac medications before your next appointment, please call your pharmacy*  Testing/Procedures: .Your physician has recommended that you wear an event monitor. Event monitors are medical devices that record the heart's electrical activity. Doctors most often [place] these monitors to diagnose arrhythmias. Arrhythmias are problems with the speed or rhythm of the heartbeat. The monitor is a small, portable device. You can wear one while you do your normal daily activities. This is usually used to diagnose what is causing palpitations/syncope (passing out).  Follow-Up: At [facility], you and your health needs are our priority.  As part of our continuing mission to provide you with exceptional heart care, we have created designated Provider Care Teams.  These Care Teams include your primary Cardiologist (physician) and Advanced Practice Providers (APPs -  Physician Assistants and Nurse Practitioners) who all work together to provide you with the care you need, when you need it.  Your next appointment:   6 month(s)  Provider:   You may see Lanier Prude, MD or one of the following Advanced Practice Providers on your designated Care Team:   Francis Dowse, [place] Casimiro Needle "Mardelle Matte" [place], [place] Sherie Don, NP     Christena Deem- Long Term Monitor Instructions  Your physician has requested you wear a ZIO patch monitor for 14 days.  This is a single patch monitor. [employer] supplies one patch monitor per enrollment. Additional stickers are not available. Please do not apply patch if you will be having a Nuclear Stress Test,  Echocardiogram, Cardiac CT, MRI, or Chest Xray during the period you would be wearing the  monitor. The patch cannot be worn during these tests. You cannot remove and re-apply  the  ZIO XT patch monitor.  Your ZIO patch monitor will be mailed 3 day USPS to your address on file. It may take 3-5 days  to receive your monitor after you have been enrolled.  Once you have received your monitor, please review the enclosed instructions. Your monitor  has already been registered assigning a specific monitor serial # to you.  Billing and Patient Assistance Program Information  We have supplied [employer] with any of your insurance information on file for billing purposes. [employer] offers a sliding scale Patient Assistance Program for patients that do not have  insurance, or whose insurance does not completely cover the cost of the ZIO monitor.  You must apply for the Patient Assistance Program to qualify for this discounted rate.  To apply, please call [employer] at [phone], select option 4, select option 2, ask to apply for  Patient Assistance Program. Meredeth Ide will ask your household income, and how many people  are in your household. They will quote your out-of-pocket cost based on that information.  [employer] will also be able to set up a 73-month, interest-free payment plan if needed.  Applying the monitor   Shave hair from upper left chest.  Hold abrader disc by orange tab. Rub abrader in 40 strokes over the upper left chest as  indicated in your monitor instructions.  Clean area with 4 enclosed alcohol pads. Let dry.  Apply patch as indicated in monitor instructions. Patch will be placed under collarbone on left  side of chest with arrow pointing upward.  Rub patch adhesive wings for 2 minutes. Remove white label marked "1". Remove  the white  label marked "2". Rub patch adhesive wings for 2 additional minutes.  While looking in a mirror, press and release button in center of patch. A small green light will  flash 3-4 times. This will be your only indicator that the monitor has been turned on.  Do not shower for the first 24 hours. You may shower after the first 24  hours.  Press the button if you feel a symptom. You will hear a small click. Record Date, Time and  Symptom in the Patient Logbook.  When you are ready to remove the patch, follow instructions on the last 2 pages of Patient  Logbook. Stick patch monitor onto the last page of Patient Logbook.  Place Patient Logbook in the blue and white box. Use locking tab on box and tape box closed  securely. The blue and white box has prepaid postage on it. Please place it in the mailbox as  soon as possible. Your physician should have your test results approximately 7 days after the  monitor has been mailed back to [facility].  Call [facility] Customer Care at [phone] if you have questions regarding  your ZIO XT patch monitor. Call them immediately if you see an orange light blinking on your  monitor.  If your monitor falls off in less than 4 days, contact our Monitor department at [phone].  If your monitor becomes loose or falls off after 4 days call [employer] at [phone] for  suggestions on securing your monitor

## 2023-06-15 NOTE — Progress Notes (Unsigned)
Enrolled for [employer] to mail a ZIO XT long term holter monitor to the patients address on file.  

## 2023-06-19 DIAGNOSIS — I10 Essential (primary) hypertension: Secondary | ICD-10-CM

## 2023-06-19 DIAGNOSIS — I493 Ventricular premature depolarization: Secondary | ICD-10-CM

## 2023-06-19 DIAGNOSIS — I5022 Chronic systolic (congestive) heart failure: Secondary | ICD-10-CM | POA: Diagnosis not present

## 2023-06-26 DIAGNOSIS — I493 Ventricular premature depolarization: Secondary | ICD-10-CM | POA: Diagnosis not present

## 2023-06-29 ENCOUNTER — Other Ambulatory Visit (HOSPITAL_COMMUNITY): Payer: Self-pay | Admitting: Internal Medicine

## 2023-07-01 ENCOUNTER — Other Ambulatory Visit (HOSPITAL_COMMUNITY): Payer: Self-pay | Admitting: Cardiology

## 2023-07-25 ENCOUNTER — Ambulatory Visit: Payer: 59 | Admitting: Orthopaedic Surgery

## 2023-07-29 NOTE — Progress Notes (Signed)
  Electrophysiology Office Follow up Visit Note:    Date:  07/30/2023   ID:  Alan Coffey, DOB [birth year], MRN [id number]  PCP:  Alan Like, NP  [facility] Cardiologist:  None  [facility] Electrophysiologist:  Lanier Prude, MD    Interval History:    Alan Coffey is a 57 y.o. male who presents for a follow up visit.   Last seen 06/15/2023 re: PVC's.  He wore a monitor after our appointment that showed a 39.6% burden of PVC's. We previously discussed PVC suppression and EP study/ablation.  He has been doing well since I last saw him.  No syncope or presyncope.    Past medical, surgical, social and family history were reviewed.  ROS:   Please see the history of present illness.    All other systems reviewed and are negative.  EKGs/Labs/Other Studies Reviewed:    The following studies were reviewed today:  06/27/2023 Zio personally reviewed HR 52 - 160, average 73 bpm. 22 nonsustained VT, longest 7 beats. Rare supraventricular ectopy. Frequent ventricular ectopy, 39.6% No sustained arrhythmias. No atrial fibrillation.       Physical Exam:    VS:  BP 130/86   Pulse 67   Ht 5\' 8"  (1.727 m)   Wt 243 lb (110.2 kg)   SpO2 96%   BMI 36.95 kg/m     Wt Readings from Last 3 Encounters:  07/30/23 243 lb (110.2 kg)  06/15/23 245 lb (111.1 kg)  03/09/23 237 lb (107.5 kg)     GEN:  Well nourished, well developed in no acute distress CARDIAC: RRR, no murmurs, rubs, gallops RESPIRATORY:  Clear to auscultation without rales, wheezing or rhonchi       ASSESSMENT:    1. PVC (premature ventricular contraction)   2. Chronic systolic heart failure (HCC)   3. Primary hypertension    PLAN:    In order of problems listed above:  #PVC's Likely contributing to his systolic heart failure. PVC suppression/ablation indicated. I discussed the options for PVC suppression with the patient including antiarrhythmic drugs and catheter ablation.  He has  failed mexiletine and I have asked him to stop that medication today.  I discussed alternative antiarrhythmic drugs with the patient.  I also discussed the catheter ablation procedure in detail with the patient.  After our discussion, the patient would Coffey to start with an alternative antiarrhythmic drug, amiodarone.  He understands the risks associated with amiodarone and the importance of monitoring for any off target effects.  I will get a CMP, TSH and free T4 today.  He will start 200 mg of amiodarone twice daily for 14 days and then continue on 200 mg by mouth once daily.  I will plan to see him back in 4 to 5 months.  Plan to repeat a 2-week ZIO monitor at that time to assess for response to therapy.  If he fails amiodarone would need to pursue catheter ablation.   #Chronic systolic heart failure NYHA II. EF reduced. In setting of frequent PVC's, PVC suppression/ablation indicated. Cont coreg, farxiga, eplerenone, entresto   #Hypertension At goal today.  Recommend checking blood pressures 1-2 times per week at home and recording the values.  Recommend bringing these recordings to the primary care physician.   Follow-up 4 to 5 months with me.   Signed, Steffanie Dunn, MD, [facility], [place] 07/30/2023 9:05 AM    Electrophysiology [facility]

## 2023-07-30 ENCOUNTER — Ambulatory Visit: Payer: 59 | Admitting: Cardiology

## 2023-07-30 ENCOUNTER — Ambulatory Visit: Payer: 59 | Attending: Cardiology | Admitting: Cardiology

## 2023-07-30 ENCOUNTER — Encounter: Payer: Self-pay | Admitting: Cardiology

## 2023-07-30 VITALS — BP 130/86 | HR 67 | Ht 68.0 in | Wt 243.0 lb

## 2023-07-30 DIAGNOSIS — I5022 Chronic systolic (congestive) heart failure: Secondary | ICD-10-CM | POA: Diagnosis not present

## 2023-07-30 DIAGNOSIS — I493 Ventricular premature depolarization: Secondary | ICD-10-CM | POA: Diagnosis not present

## 2023-07-30 DIAGNOSIS — I10 Essential (primary) hypertension: Secondary | ICD-10-CM | POA: Diagnosis not present

## 2023-07-30 MED ORDER — AMIODARONE HCL 200 MG PO TABS: ORAL_TABLET | ORAL | 3 refills | Status: DC

## 2023-07-30 NOTE — Patient Instructions (Addendum)
Medication Instructions:  Your physician has recommended you make the following change in your medication:  1) STOP taking mexiletine  2) START taking amiodarone 200 mg twice daily for two weeks, then 200 mg once daily thereafter *If you need a refill on your cardiac medications before your next appointment, please call your pharmacy*   Lab Work: TODAY: CMET, TSH, T4 IN 8 WEEKS: CMET , TSH, T4  If you have labs (blood work) drawn today and your tests are completely normal, you will receive your results only by: MyChart Message (if you have MyChart) OR A paper copy in the mail If you have any lab test that is abnormal or we need to change your treatment, we will call you to review the results.  Follow-Up: At [facility], you and your health needs are our priority.  As part of our continuing mission to provide you with exceptional heart care, we have created designated Provider Care Teams.  These Care Teams include your primary Cardiologist (physician) and Advanced Practice Providers (APPs -  Physician Assistants and Nurse Practitioners) who all work together to provide you with the care you need, when you need it  Your next appointment:   4 month(s)  Provider:   Steffanie Dunn, MD

## 2023-08-08 ENCOUNTER — Other Ambulatory Visit (HOSPITAL_COMMUNITY): Payer: Self-pay | Admitting: Cardiology

## 2023-08-14 NOTE — Progress Notes (Signed)
ADVANCED HF CLINIC NOTE   Primary Care: Alan Like, NP HF Cardiologist: Dr. Gala Coffey  HPI: [name] is a 57 y.o. with HTN, HL, and systolic heart failure due to NICM.  Admitted 5/23 with new acute CHF. Echo EF 25-30%, RV normal, Grade II DD. R/LHC showed with mild nonobstructive CAD, low filling pressures with moderate to severely reduced CO. cMRI: LVEF 21%, RVEF 35%, mid-myocardial LGE basal septum and mid inferior wall. Felt CM possible prior viral myocarditis vs cardiac sarcoidosis.GDMT started, mexiletine added for PVC suppression. He was discharged home, weight 244.6 lbs.  PET scan 1/24 at [facility] EF 42% no sarcoid   Has been on mexilitene for PVC suppression Zio 7/24 -> 39.6% PVcs. Was seen by Dr. Lalla Coffey 07/30/23 switched to Alan Coffey   He is here today for HF follow-up. Took amio 200 bid for 14 days. Now taking 200 daily. Says it made him feel funny - less energy. But getting better.  Walking 5-6 miles per day.No CP or SOB.  Cardiac Studies: - cMRI (5/23):  LVEF 21%, RVEF 35%, mid-myocardial LGE basal septum and mid inferior wall.   - R/LHC (5/23): minimal CAD.    Ao = 83/65 (71) LV = 98/9 RA = 2 RV = 20/7 PA = 20/12 (15) PCW = 5 Fick cardiac output/index = 4.1/1.9 PVR = 2.4 WU SVR = 1,244 Ao sat = 95% PA sat = 60%, 61%  - Echo (5/23): EF 25-30%, RV okay.  ROS: All systems reviewed and negative except as per HPI.   Past Medical History:  Diagnosis Date   Arthritis    Dry skin    on chest using goldbond lotion on   Hypertension    OA (osteoarthritis)    Sciatic nerve pain right comes and goes   Current Outpatient Medications  Medication Sig Dispense Refill   acetaminophen (TYLENOL) 325 MG tablet Take 325 mg by mouth every 6 (six) hours as needed for moderate pain or headache.     amiodarone (PACERONE) 200 MG tablet Take 200 mg by mouth daily.     amLODipine (NORVASC) 5 MG tablet Take 1 tablet (5 mg total) by mouth daily. 90 tablet 3   aspirin EC 81 MG  tablet Take 1 tablet (81 mg total) by mouth daily. Swallow whole. 90 tablet 3   atorvastatin (LIPITOR) 40 MG tablet TAKE 1 TABLET EVERY DAY 90 tablet 1   carvedilol (COREG) 12.5 MG tablet Take 1 tablet (12.5 mg total) by mouth 2 (two) times daily with a meal. 180 tablet 3   dapagliflozin propanediol (FARXIGA) 10 MG TABS tablet TAKE 1 TABLET BY MOUTH EVERY DAY 90 tablet 0   diphenhydrAMINE (BENADRYL) 25 MG tablet Take 25 mg by mouth every 6 (six) hours as needed for allergies.     eplerenone (INSPRA) 25 MG tablet Take 1 tablet (25 mg total) by mouth daily. 90 tablet 3   furosemide (LASIX) 20 MG tablet TAKE 1 TABLET EVERY DAY AS NEEDED FOR EDEMA OR FLUID 30 tablet 2   Hypromellose (ALZAIR ALLERGY NASAL SPRAY NA) Place 1 spray into both nostrils at bedtime.     sacubitril-valsartan (ENTRESTO) 97-103 MG Take 1 tablet by mouth 2 (two) times daily. 180 tablet 3   No current facility-administered medications for this encounter.   Allergies  Allergen Reactions   Spironolactone Other (See Comments)    gynecomastia    Social History   Socioeconomic History   Marital status: Divorced    Spouse name: Not on  file   Number of children: Not on file   Years of education: Not on file   Highest education level: Not on file  Occupational History   Not on file  Tobacco Use   Smoking status: Every Day    Current packs/day: 0.50    Average packs/day: 0.5 packs/day for 37.0 years (18.5 ttl pk-yrs)    Types: Cigarettes   Smokeless tobacco: Never  Vaping Use   Vaping status: Never Used  Substance and Sexual Activity   Alcohol use: Never   Drug use: Never   Sexual activity: Not on file  Other Topics Concern   Not on file  Social History Narrative   Not on file   Social Determinants of Health   Financial Resource Strain: Medium Risk (05/31/2022)   Overall Financial Resource Strain (CARDIA)    Difficulty of Paying Living Expenses: Somewhat hard  Food Insecurity: No Food Insecurity (05/31/2022)    Hunger Vital Sign    Worried About Running Out of Food in the Last Year: Never true    Ran Out of Food in the Last Year: Never true  Transportation Needs: Not on file  Physical Activity: Not on file  Stress: Not on file  Social Connections: Not on file  Intimate Partner Violence: Not on file   Family History  Problem Relation Age of Onset   Heart failure Father    Heart attack Brother    BP 118/80   Pulse 66   Wt 114 kg (251 lb 6.4 oz)   SpO2 96%   BMI 38.23 kg/m   Wt Readings from Last 3 Encounters:  08/15/23 114 kg (251 lb 6.4 oz)  07/30/23 110.2 kg (243 lb)  06/15/23 111.1 kg (245 lb)   PHYSICAL EXAM: General:  Well appearing. No resp difficulty HEENT: normal Neck: supple. no JVD. Carotids 2+ bilat; no bruits. No lymphadenopathy or thryomegaly appreciated. Cor: PMI nondisplaced. Irregular rate & rhythm. No rubs, gallops or murmurs. Lungs: clear Abdomen: soft, nontender, nondistended. No hepatosplenomegaly. No bruits or masses. Good bowel sounds. Extremities: no cyanosis, clubbing, rash, edema Neuro: alert & orientedx3, cranial nerves grossly intact. moves all 4 extremities w/o difficulty. Affect pleasant  NSR 66 + frequent PVCs. Personally reviewed   ASSESSMENT & PLAN:  Chronic Systolic Heart Failure: - Echo (5/23): EF 25-30%, RV okay. Nonischemic cardiomyopathy.  - R/LHC (5/23): with mild nonobstructive CAD, low filling pressures with moderate to severely reduced CO.  - Cardiac MRI (5/23): LVEF 21%, RVEF 35%, mid-myocardial LGE basal septum and mid inferior wall, ECV elevated mid inferoseptal (43%) and severely elevated basal inferoseptal (63%) possible sarcoidosis - Echo 09/08/22: EF 40-45%, RV okay - PET scan 1/24 at [facility] EF 42% no sarcoid. /. If MRI related to myocarditis  - Stable NYHA I. Volume OK - Continue Entresto to 97/103 mg bid.  - Continue Lasix 20 mg daily PRN. - Continue eplerenone 25 daily (had gynecomastia with spiro)  - Continue Farxiga 10 mg  daily.  - Continue carvedilol 12.5 mg BID - Labs today   2. HTN  -Blood pressure well controlled. Continue current regimen.   3. HL - Continue atorvastatin.  - PCP following lipids   4. Tobacco Abuse - Remains quit   5. PVCs/NSVT  - Multifocal PVCs.  - Has been on mexilitene for PVC suppression Zio 7/24 -> 39.6% PVcs. Was seen by Dr. Lalla Coffey 07/30/23 switched to amio  - Still with frequent PVCs despite amio - Discussed possible PVC ablation. He will discuss with  Dr. Lalla Coffey  - Treat OSA   6. Non-obstructive CAD - Very mild.  - No s/s angina - Continue statin/ASA  7. OSA - Severe, AHI 44/hr (6/23). - Has not started CPAP due to cost - Just got a CPAP. Encouraged him to use   Arvilla Meres, MD  9:35 AM

## 2023-08-15 ENCOUNTER — Ambulatory Visit (HOSPITAL_COMMUNITY)
Admission: RE | Admit: 2023-08-15 | Discharge: 2023-08-15 | Disposition: A | Discharge status: Home / Self Care | Payer: 59 | Source: Ambulatory Visit | Attending: Internal Medicine | Admitting: Internal Medicine

## 2023-08-15 ENCOUNTER — Encounter (HOSPITAL_COMMUNITY): Payer: Self-pay | Admitting: Internal Medicine

## 2023-08-15 VITALS — BP 118/80 | HR 66 | Wt 251.4 lb

## 2023-08-15 DIAGNOSIS — I10 Essential (primary) hypertension: Secondary | ICD-10-CM

## 2023-08-15 DIAGNOSIS — Z7984 Long term (current) use of oral hypoglycemic drugs: Secondary | ICD-10-CM | POA: Diagnosis not present

## 2023-08-15 DIAGNOSIS — I5022 Chronic systolic (congestive) heart failure: Secondary | ICD-10-CM | POA: Diagnosis not present

## 2023-08-15 DIAGNOSIS — I2510 Atherosclerotic heart disease of native coronary artery without angina pectoris: Secondary | ICD-10-CM | POA: Diagnosis not present

## 2023-08-15 DIAGNOSIS — Z87891 Personal history of nicotine dependence: Secondary | ICD-10-CM | POA: Insufficient documentation

## 2023-08-15 DIAGNOSIS — G4733 Obstructive sleep apnea (adult) (pediatric): Secondary | ICD-10-CM

## 2023-08-15 DIAGNOSIS — I493 Ventricular premature depolarization: Secondary | ICD-10-CM

## 2023-08-15 DIAGNOSIS — I472 Ventricular tachycardia, unspecified: Secondary | ICD-10-CM | POA: Insufficient documentation

## 2023-08-15 DIAGNOSIS — I110 Hypertensive heart disease with heart failure: Secondary | ICD-10-CM | POA: Diagnosis not present

## 2023-08-15 DIAGNOSIS — I428 Other cardiomyopathies: Secondary | ICD-10-CM | POA: Diagnosis not present

## 2023-08-15 DIAGNOSIS — Z79899 Other long term (current) drug therapy: Secondary | ICD-10-CM | POA: Insufficient documentation

## 2023-08-15 MED ORDER — SILDENAFIL CITRATE 100 MG PO TABS: 100.0000 mg | ORAL_TABLET | Freq: Every day | ORAL | 3 refills | Status: DC | PRN

## 2023-08-15 NOTE — Patient Instructions (Signed)
Medication Changes:  START: VIAGRA (SILDENAFIL) 100MG  TABLETS 1/2-1 WHOLE TABLET AS NEEDED   Follow-Up in: 6 MONTHS PLEASE CALL OUR OFFICE AROUND NOVEMBER TO GET SCHEDULED FOR YOUR APPOINTMENT. PHONE NUMBER IS [phone] OPTION 2   At the [facility], you and your health needs are our priority. We have a designated team specialized in the treatment of Heart Failure. This Care Team includes your primary Heart Failure Specialized Cardiologist (physician), Advanced Practice Providers (APPs- Physician Assistants and Nurse Practitioners), and Pharmacist who all work together to provide you with the care you need, when you need it.   You may see any of the following providers on your designated Care Team at your next follow up:  Dr. Arvilla Meres Dr. Marca Ancona Dr. Marcos Eke, NP Robbie Lis, [place] [facility] [place], [place] Brynda Peon, NP Karle Plumber, PharmD   Please be sure to bring in all your medications bottles to every appointment.   Need to Contact us:  If you have any questions or concerns before your next appointment please send [place] a message through [place] or call our office at [phone].    TO LEAVE A MESSAGE FOR THE NURSE SELECT OPTION 2, PLEASE LEAVE A MESSAGE INCLUDING: YOUR NAME DATE OF BIRTH CALL BACK NUMBER REASON FOR CALL**this is important as we prioritize the call backs  YOU WILL RECEIVE A CALL BACK THE SAME DAY AS LONG AS YOU CALL BEFORE 4:00 PM

## 2023-09-04 ENCOUNTER — Other Ambulatory Visit (HOSPITAL_COMMUNITY): Payer: Self-pay | Admitting: Internal Medicine

## 2023-09-06 ENCOUNTER — Other Ambulatory Visit (HOSPITAL_COMMUNITY): Payer: Self-pay | Admitting: Internal Medicine

## 2023-09-07 ENCOUNTER — Other Ambulatory Visit (HOSPITAL_COMMUNITY): Payer: Self-pay

## 2023-09-10 ENCOUNTER — Other Ambulatory Visit (HOSPITAL_COMMUNITY): Payer: Self-pay

## 2023-09-10 ENCOUNTER — Telehealth (HOSPITAL_COMMUNITY): Payer: Self-pay

## 2023-09-10 ENCOUNTER — Other Ambulatory Visit (HOSPITAL_COMMUNITY): Payer: Self-pay | Admitting: Cardiology

## 2023-09-10 NOTE — Telephone Encounter (Signed)
Advanced Heart Failure Patient Advocate Encounter  Returned pt call about [place] prior auth. CMM Key has been accessed and archived by someone not with [facility], I am unable to start a new key as there is a 'request in progress.'  Contacted insurance directly, provided information for PA questions over the phone. Faxed most recent office visit / chart notes to [phone] at representative recommendation.  Request is being reviewed by a pharmacist, determination letter will be faxed once a decision is made.   Samples provided to patient in the meantime.  Medication Samples have been provided to patient directly Drug name: Farxiga 10 MG Qty: 4x 7 ct package LOT: [id number] Exp.: 03/24/2026 SIG: Take 1 tablet by mouth once daily   The patient has been instructed regarding the correct time, dose, and frequency of taking this medication, including desired effects and most common side effects.   Burnell Blanks, CPhT Rx Patient Advocate Phone: [phone]

## 2023-09-11 ENCOUNTER — Other Ambulatory Visit (HOSPITAL_COMMUNITY): Payer: Self-pay

## 2023-09-11 NOTE — Telephone Encounter (Signed)
Advanced Heart Failure Patient Advocate Encounter  Prior auth for Marcelline Deist has been approved, test billing shows $50 copay for 30 day supply. Informed patient by phone, and explained how to obtain [place] savings card. Pt will obtain savings card online to take to pharmacy, and will contact me if there are further issues.

## 2023-09-20 MED ORDER — FARXIGA 10 MG PO TABS: 10.0000 mg | ORAL_TABLET | Freq: Every day | ORAL | 3 refills | Status: DC

## 2023-09-25 ENCOUNTER — Telehealth (HOSPITAL_COMMUNITY): Payer: Self-pay

## 2023-09-25 ENCOUNTER — Other Ambulatory Visit (HOSPITAL_COMMUNITY): Payer: Self-pay

## 2023-09-25 ENCOUNTER — Ambulatory Visit: Payer: 59 | Attending: Cardiology

## 2023-09-25 DIAGNOSIS — I10 Essential (primary) hypertension: Secondary | ICD-10-CM

## 2023-09-25 DIAGNOSIS — I5022 Chronic systolic (congestive) heart failure: Secondary | ICD-10-CM

## 2023-09-25 DIAGNOSIS — I493 Ventricular premature depolarization: Secondary | ICD-10-CM | POA: Diagnosis not present

## 2023-09-25 NOTE — Telephone Encounter (Signed)
Advanced Heart Failure Patient Advocate Encounter  Returned pt call, [facility] is having trouble processing copay savings card. As of today (09/25/23) pt is covered by a Medicare plan.  The patient was approved for a [employer] grant that will help cover the cost of Carvedilol, Entresto, Eplerenone, Farxiga.  Total amount awarded, $10,000.  Effective: 08/26/2023 - 08/24/2024.  BIN [id number] PCN [id number] [id number] ID [id number]  Pharmacy provided with approval and processing information. Patient informed via phone and USPS.  Burnell Blanks, CPhT Rx Patient Advocate Phone: [phone]

## 2023-09-26 LAB — COMPREHENSIVE METABOLIC PANEL
ALT: 29 [IU]/L (ref 0–44)
AST: 20 [IU]/L (ref 0–40)
Albumin: 4.4 g/dL (ref 3.8–4.9)
Alkaline Phosphatase: 70 [IU]/L (ref 44–121)
BUN/Creatinine Ratio: 14 (ref 9–20)
BUN: 15 mg/dL (ref 6–24)
Bilirubin Total: 0.3 mg/dL (ref 0.0–1.2)
CO2: 25 mmol/L (ref 20–29)
Calcium: 8.9 mg/dL (ref 8.7–10.2)
Chloride: 104 mmol/L (ref 96–106)
Creatinine, Ser: 1.09 mg/dL (ref 0.76–1.27)
Globulin, Total: 2.3 g/dL (ref 1.5–4.5)
Glucose: 101 mg/dL — ABNORMAL HIGH (ref 70–99)
Potassium: 4.2 mmol/L (ref 3.5–5.2)
Sodium: 142 mmol/L (ref 134–144)
Total Protein: 6.7 g/dL (ref 6.0–8.5)
eGFR: 80 mL/min/{1.73_m2} (ref >59)

## 2023-09-26 LAB — T4, FREE: Free T4: 1.33 ng/dL (ref 0.82–1.77)

## 2023-09-26 LAB — TSH: TSH: 2.04 u[IU]/mL (ref 0.450–4.500)

## 2023-12-09 NOTE — Progress Notes (Unsigned)
  Cardiology Office Note:  .   Date:  12/09/2023  ID:  Alan Coffey, DOB 1966/05/15, MRN [id number] PCP: Jim Like, NP  [facility] Providers Cardiologist:  None Electrophysiologist:  Lanier Prude, MD {  History of Present Illness: .   ZHAYNE LOU is a 57 y.o. male w/PMHx of HTN, NICM, PVCs  -- Echo EF 25-30%, RV normal, Grade II DD.  -- R/LHC showed with mild nonobstructive CAD, low filling pressures with moderate to severely reduced CO.  -- cMRI: LVEF 21%, RVEF 35%, mid-myocardial LGE basal septum and mid inferior wall. Felt CM possible prior viral myocarditis vs cardiac sarcoidosis  -- PET scan 1/24 at [facility] EF 42% no sarcoid   Saw Dr. Lalla Brothers 07/30/23, discussed hx of PVCs (burden of 39.6%), mexiletine felt to have failed and stopped.  Discussed management options including ablation, pt favoring avoid invasive procedure and started on amiodarone Advised 4-5 mo follow up and repeat monitoring Baseline labs done  Saw Dr. Gala Romney 08/15/23, initially during higher dose of amiodarone, felt "funny", but improving on 200mg  daily, walking 5-94miles most days On GDMT, meds unchanged, planned for labs Still with frequent PVCs despite amiodarone, planned to revisit with Dr. Lalla Brothers for consideration of ablation  Today's visit is scheduled as a 4 mo visit  ROS:   He feels well Never has been aware of the PVCs No CP, palpitations No SOB No near syncope or syncope Tolerating amiodarone  Arrhythmia/AAD hx Mexiletine stopped Aug 2024 with failure to suppress PVCs Amiodarone started Aug 2024  Studies Reviewed: Marland Kitchen    EKG done today and reviewed by myself:  SR 65bpm, Q III, aVF, no PVCs  06/27/2023 Zio  HR 52 - 160, average 73 bpm. 22 nonsustained VT, longest 7 beats. Rare supraventricular ectopy. Frequent ventricular ectopy, 39.6% No sustained arrhythmias. No atrial fibrillation.   Risk Assessment/Calculations:    Physical Exam:   VS:  There were  no vitals taken for this visit.   Wt Readings from Last 3 Encounters:  08/15/23 251 lb 6.4 oz (114 kg)  07/30/23 243 lb (110.2 kg)  06/15/23 245 lb (111.1 kg)    GEN: Well nourished, well developed in no acute distress NECK: No JVD; No carotid bruits CARDIAC: RRR, no murmurs, rubs, gallops RESPIRATORY:  CTA b/l without rales, wheezing or rhonchi  ABDOMEN: Soft, non-tender, non-distended EXTREMITIES:  No edema; No deformity    ASSESSMENT AND PLAN: .    PVCs Amiodarone No PVCs on EKG or exam today Will plan 3 day monitor to revisit burden Labs tody  He prefers to stay on the amiodarone as long as it is working, though if not by monitoring would be more agreeable to pursue ablation   NICM No symptoms or exam findings of volume OL LVEF by last evaluation/PET was 42% C/w Dr. Lynne Logan   Dispo: back in 4 mo, sooner if needed  Signed, Adorian Gwynne Norberto Sorenson, PA-C

## 2023-12-11 ENCOUNTER — Encounter: Payer: Self-pay | Admitting: Physician Assistant

## 2023-12-11 ENCOUNTER — Ambulatory Visit: Payer: Medicare HMO | Attending: Cardiology | Admitting: Physician Assistant

## 2023-12-11 ENCOUNTER — Ambulatory Visit (INDEPENDENT_AMBULATORY_CARE_PROVIDER_SITE_OTHER): Payer: Medicare HMO

## 2023-12-11 VITALS — BP 118/82 | HR 68 | Ht 68.0 in | Wt 265.4 lb

## 2023-12-11 DIAGNOSIS — I493 Ventricular premature depolarization: Secondary | ICD-10-CM

## 2023-12-11 DIAGNOSIS — Z79899 Other long term (current) drug therapy: Secondary | ICD-10-CM | POA: Diagnosis not present

## 2023-12-11 DIAGNOSIS — I5022 Chronic systolic (congestive) heart failure: Secondary | ICD-10-CM

## 2023-12-11 LAB — HEPATIC FUNCTION PANEL
ALT: 36 [IU]/L (ref 0–44)
AST: 22 [IU]/L (ref 0–40)
Albumin: 4.7 g/dL (ref 3.8–4.9)
Alkaline Phosphatase: 84 [IU]/L (ref 44–121)
Bilirubin Total: 0.3 mg/dL (ref 0.0–1.2)
Bilirubin, Direct: 0.12 mg/dL (ref 0.00–0.40)
Total Protein: 7.2 g/dL (ref 6.0–8.5)

## 2023-12-11 LAB — TSH: TSH: 1.34 u[IU]/mL (ref 0.450–4.500)

## 2023-12-11 NOTE — Patient Instructions (Addendum)
Medication Instructions:   Your physician recommends that you continue on your current medications as directed. Please refer to the Current Medication list given to you today.   *If you need a refill on your cardiac medications before your next appointment, please call your pharmacy*   Lab Work:   PLEASE GO DOWN STAIRS FIRST FLOOR  [address] :  LFT AND TSH TODAY    If you have labs (blood work) drawn today and your tests are completely normal, you will receive your results only by: MyChart Message (if you have MyChart) OR A paper copy in the mail If you have any lab test that is abnormal or we need to change your treatment, we will call you to review the results.   Testing/Procedures: Your physician has recommended that you wear an event monitor. Event monitors are medical devices that record the heart's electrical activity. Doctors most often [place] these monitors to diagnose arrhythmias. Arrhythmias are problems with the speed or rhythm of the heartbeat. The monitor is a small, portable device. You can wear one while you do your normal daily activities. This is usually used to diagnose what is causing palpitations/syncope (passing out).    Follow-Up: At [facility], you and your health needs are our priority.  As part of our continuing mission to provide you with exceptional heart care, we have created designated Provider Care Teams.  These Care Teams include your primary Cardiologist (physician) and Advanced Practice Providers (APPs -  Physician Assistants and Nurse Practitioners) who all work together to provide you with the care you need, when you need it.  We recommend signing up for the patient portal called "MyChart".  Sign up information is provided on this After Visit Summary.  MyChart is used to connect with patients for Virtual Visits (Telemedicine).  Patients are able to view lab/test results, encounter notes, upcoming appointments, etc.  Non-urgent messages can be sent to  your provider as well.   To learn more about what you can do with MyChart, go to [email or web address].    Your next appointment:   4 month(s)  Provider:   You may see Lanier Prude, MD or one of the following Advanced Practice Providers on your designated Care Team:   Francis Dowse, [place]  Other Instructions  ZIO XT- Long Term Monitor Instructions  Your physician has requested you wear a ZIO patch monitor for 3 days.  This is a single patch monitor. [employer] supplies one patch monitor per enrollment. Additional stickers are not available. Please do not apply patch if you will be having a Nuclear Stress Test,  Echocardiogram, Cardiac CT, MRI, or Chest Xray during the period you would be wearing the  monitor. The patch cannot be worn during these tests. You cannot remove and re-apply the  ZIO XT patch monitor.  Your ZIO patch monitor will be mailed 3 day USPS to your address on file. It may take 3-5 days  to receive your monitor after you have been enrolled.  Once you have received your monitor, please review the enclosed instructions. Your monitor  has already been registered assigning a specific monitor serial # to you.  Billing and Patient Assistance Program Information  We have supplied [employer] with any of your insurance information on file for billing purposes. [employer] offers a sliding scale Patient Assistance Program for patients that do not have  insurance, or whose insurance does not completely cover the cost of the ZIO monitor.  You must apply for the Patient  Assistance Program to qualify for this discounted rate.  To apply, please call [employer] at [phone], select option 4, select option 2, ask to apply for  Patient Assistance Program. Meredeth Ide will ask your household income, and how many people  are in your household. They will quote your out-of-pocket cost based on that information.  [employer] will also be able to set up a 72-month, interest-free payment plan if  needed.  Applying the monitor   Shave hair from upper left chest.  Hold abrader disc by orange tab. Rub abrader in 40 strokes over the upper left chest as  indicated in your monitor instructions.  Clean area with 4 enclosed alcohol pads. Let dry.  Apply patch as indicated in monitor instructions. Patch will be placed under collarbone on left  side of chest with arrow pointing upward.  Rub patch adhesive wings for 2 minutes. Remove white label marked "1". Remove the white  label marked "2". Rub patch adhesive wings for 2 additional minutes.  While looking in a mirror, press and release button in center of patch. A small green light will  flash 3-4 times. This will be your only indicator that the monitor has been turned on.  Do not shower for the first 24 hours. You may shower after the first 24 hours.  Press the button if you feel a symptom. You will hear a small click. Record Date, Time and  Symptom in the Patient Logbook.  When you are ready to remove the patch, follow instructions on the last 2 pages of Patient  Logbook. Stick patch monitor onto the last page of Patient Logbook.  Place Patient Logbook in the blue and white box. Use locking tab on box and tape box closed  securely. The blue and white box has prepaid postage on it. Please place it in the mailbox as  soon as possible. Your physician should have your test results approximately 7 days after the  monitor has been mailed back to [facility].  Call [facility] Customer Care at [phone] if you have questions regarding  your ZIO XT patch monitor. Call them immediately if you see an orange light blinking on your  monitor.  If your monitor falls off in less than 4 days, contact our Monitor department at [phone].  If your monitor becomes loose or falls off after 4 days call [employer] at [phone] for  suggestions on securing your monitor

## 2023-12-11 NOTE — Progress Notes (Unsigned)
Enrolled patient for a 3 day Zio XT monitor to be mailed to patients home   Alan Coffey to read

## 2023-12-13 ENCOUNTER — Other Ambulatory Visit (HOSPITAL_COMMUNITY): Payer: Self-pay | Admitting: Internal Medicine

## 2023-12-13 ENCOUNTER — Other Ambulatory Visit (HOSPITAL_COMMUNITY): Payer: Self-pay | Admitting: Cardiology

## 2023-12-20 DIAGNOSIS — I493 Ventricular premature depolarization: Secondary | ICD-10-CM | POA: Diagnosis not present

## 2023-12-25 ENCOUNTER — Ambulatory Visit: Payer: 59 | Admitting: Cardiology

## 2024-01-09 ENCOUNTER — Encounter: Payer: Self-pay | Admitting: Orthopaedic Surgery

## 2024-01-09 ENCOUNTER — Ambulatory Visit (INDEPENDENT_AMBULATORY_CARE_PROVIDER_SITE_OTHER): Payer: Medicare HMO | Admitting: Sports Medicine

## 2024-01-09 ENCOUNTER — Ambulatory Visit (INDEPENDENT_AMBULATORY_CARE_PROVIDER_SITE_OTHER): Payer: Self-pay

## 2024-01-09 ENCOUNTER — Other Ambulatory Visit: Payer: Self-pay

## 2024-01-09 ENCOUNTER — Ambulatory Visit: Payer: Medicare HMO | Admitting: Orthopaedic Surgery

## 2024-01-09 ENCOUNTER — Encounter: Payer: Self-pay | Admitting: Sports Medicine

## 2024-01-09 DIAGNOSIS — Z96643 Presence of artificial hip joint, bilateral: Secondary | ICD-10-CM

## 2024-01-09 DIAGNOSIS — M25552 Pain in left hip: Secondary | ICD-10-CM | POA: Diagnosis not present

## 2024-01-09 NOTE — Progress Notes (Signed)
 Office Visit Note   Patient: Alan Coffey           Date of Birth: [birth year]           MRN: [id number] Visit Date: 01/09/2024              Requested by: Arnie Lao, MD [address]  [address] [place],  [place] [address] PCP: Lindy Rhyme, NP   Assessment & Plan: Visit Diagnoses:  1. H/O bilateral hip replacements   2. Pain in left hip     Plan: Given his discomfort with left hip flexion recommend hip injection under ultrasound for possible psoas tendinitis.  This would be for both diagnostic and hopefully therapeutic purposes.  Will see him back 2 weeks after the injection to go over results and discuss further treatment.  Questions were encouraged and answered by Dr. Lucienne Ryder and myself.  Follow-Up Instructions: Return for 2 weeks after injection.   Orders:  Orders Placed This Encounter  Procedures   XR HIP UNILAT W OR W/O PELVIS 1V LEFT   No orders of the defined types were placed in this encounter.     Procedures: No procedures performed   Clinical Data: No additional findings.   Subjective: Chief Complaint  Patient presents with   Left Hip - Pain    HPI Alan Coffey since 58 year old male comes in today due to left hip groin pain/discomfort.  He states this has been ongoing for the past 6 months.  No particular injury.  He does note that he had 1 incident where he "hyperextended the hip".  Denies any frank falls.  History of right total hip arthroplasty 10/14/2021 left total hip arthroplasty 07/01/2021.  States he has had no fevers chills.  States no abnormal warmth or swelling about the left hip.  Denies any radicular symptoms down the leg.  Review of Systems  Constitutional:  Negative for chills and fever.     Objective: Vital Signs: There were no vitals taken for this visit.  Physical Exam Constitutional:      Appearance: He is not ill-appearing or diaphoretic.  Pulmonary:     Effort: Pulmonary effort is normal.  Neurological:      Mental Status: He is alert and oriented to person, place, and time.  Psychiatric:        Mood and Affect: Mood normal.     Ortho Exam Bilateral hips: Good range of motion of both hips with internal/external rotation without pain.  Nontender over the trochanteric region both hips.  Subjective discomfort left hip groin with flexion no discomfort with flexion of the right hip.  Ambulates without any assistive device nonantalgic gait.  Able to get on and off the exam table on his own.  Specialty Comments:  No specialty comments available.  Imaging: XR HIP UNILAT W OR W/O PELVIS 1V LEFT Result Date: 01/09/2024 AP pelvis and lateral view left hip: Status post bilateral total hip arthroplasty with well-seated components.  No acute fractures acute findings.  Both hips are well located.    PMFS History: Patient Active Problem List   Diagnosis Date Noted   Hypokalemia 05/13/2022   Acute clinical systolic heart failure (HCC) 05/10/2022   HTN (hypertension) 05/10/2022   HLD (hyperlipidemia) 05/10/2022   Class 2 obesity 05/10/2022   Status post total replacement of right hip 10/14/2021   Status post left hip replacement 07/01/2021   Unilateral primary osteoarthritis, left hip 05/19/2021   Unilateral primary osteoarthritis, right hip 05/19/2021   Mass of  soft tissue of left upper extremity 03/30/2020   Past Medical History:  Diagnosis Date   Arthritis    Dry skin    on chest using goldbond lotion on   Hypertension    OA (osteoarthritis)    Sciatic nerve pain right comes and goes    Family History  Problem Relation Age of Onset   Heart failure Father    Heart attack Brother     Past Surgical History:  Procedure Laterality Date   cyst removed left shoulder      MASS EXCISION Left 06/11/2020   Procedure: REMOVAL OF LEFT SHOULDER SUBCUTANEOUS MASS;  Surgeon: Candyce Champagne, MD;  Location: [facility];  Service: General;  Laterality: Left;  MAC VS GENERAL (ANESTHESIA  CHOICE)   RIGHT/LEFT HEART CATH AND CORONARY ANGIOGRAPHY N/A 05/11/2022   Procedure: RIGHT/LEFT HEART CATH AND CORONARY ANGIOGRAPHY;  Surgeon: Mardell Shade, MD;  Location: [facility];  Service: Cardiovascular;  Laterality: N/A;   TOTAL HIP ARTHROPLASTY Left 07/01/2021   Procedure: LEFT TOTAL HIP ARTHROPLASTY ANTERIOR APPROACH;  Surgeon: Arnie Lao, MD;  Location: [facility];  Service: Orthopedics;  Laterality: Left;   TOTAL HIP ARTHROPLASTY Right 10/14/2021   Procedure: RIGHT TOTAL HIP ARTHROPLASTY ANTERIOR APPROACH;  Surgeon: Arnie Lao, MD;  Location: [facility];  Service: Orthopedics;  Laterality: Right;   Social History   Occupational History   Not on file  Tobacco Use   Smoking status: Every Day    Current packs/day: 0.50    Average packs/day: 0.5 packs/day for 37.0 years (18.5 ttl pk-yrs)    Types: Cigarettes   Smokeless tobacco: Never  Vaping Use   Vaping status: Never Used  Substance and Sexual Activity   Alcohol use: Never   Drug use: Never   Sexual activity: Not on file

## 2024-01-09 NOTE — Progress Notes (Signed)
   Procedure Note  Patient: Alan Coffey             Date of Birth: 22-Sep-1966           MRN: [id number]             Visit Date: 01/09/2024  Procedures: Visit Diagnoses:  1. Pain in left hip   2. H/O bilateral hip replacements    US -guided Iliopsoas Tendon Sheath Injection, Left Hip: After discussion on risk/benefits/indications, an informed verbal consent was obtained. A timeout was then performed. The patient was lying supine on examination table with the affected leg relaxed in neutral position. The area overlying the groin and psoas tendon was prepped with ChloraPrep and multiple alcohol swabs . The ultrasound probe was placed in an oblique plane parallel to the inguinal ligament and superior to femoral head. The overlying soft tissue was anesthesized with 4cc of lidocaine  1%. Using ultrasound guidance via an in-plane approach, a 22-gauge, 3.5" needle was inserted from a lateral to medial direction into the iliopsoas tendon sheath between the tendon and ilium. The tendon sheath was then injected with a mixture of 1:1:1cc of lidocaine :bupivicaine:celestone. Appropriate spread of the injectate within the tendon sheath was visualized with ultrasound guidance. Patient tolerated the procedure well without immediate complications.  A Band-Aid was then applied.    - he will f/u with Dr. Monnie Anthony as indicated in 2 weeks; I am happy to see him as needed  Shauna Del, DO Primary Care Sports Medicine Physician  [facility] - Orthoped[facility]  This note was dictated using Dragon naturally speaking software and may contain errors in syntax, spelling, or content which have not been identified prior to signing this note.

## 2024-01-24 ENCOUNTER — Ambulatory Visit (INDEPENDENT_AMBULATORY_CARE_PROVIDER_SITE_OTHER): Payer: Medicare HMO | Admitting: Physician Assistant

## 2024-01-24 ENCOUNTER — Encounter: Payer: Self-pay | Admitting: Physician Assistant

## 2024-01-24 DIAGNOSIS — M25552 Pain in left hip: Secondary | ICD-10-CM

## 2024-01-24 NOTE — Progress Notes (Addendum)
HPI: Alan Coffey comes back in today for follow-up of his left hip.  States he is doing much better.  TLC 8085% better.  Able to flex his hip better still has a twinge of discomfort but he states it is something new liquid.  He will continue stretching.  Again he had an iliopsoas injection of the tendon sheath by Dr. Shon Baton.  Good range of motion hip today.  No charge for today's office visit.  Follow-up as needed.

## 2024-01-29 ENCOUNTER — Ambulatory Visit (HOSPITAL_COMMUNITY)
Admission: RE | Admit: 2024-01-29 | Discharge: 2024-01-29 | Disposition: A | Discharge status: Home / Self Care | Payer: Medicare HMO | Source: Ambulatory Visit | Attending: Internal Medicine | Admitting: Internal Medicine

## 2024-01-29 ENCOUNTER — Encounter (HOSPITAL_COMMUNITY): Payer: 59 | Admitting: Internal Medicine

## 2024-01-29 ENCOUNTER — Encounter (HOSPITAL_COMMUNITY): Payer: Self-pay | Admitting: Internal Medicine

## 2024-01-29 VITALS — BP 130/80 | HR 72 | Wt 274.0 lb

## 2024-01-29 DIAGNOSIS — I2510 Atherosclerotic heart disease of native coronary artery without angina pectoris: Secondary | ICD-10-CM | POA: Insufficient documentation

## 2024-01-29 DIAGNOSIS — Z87891 Personal history of nicotine dependence: Secondary | ICD-10-CM | POA: Diagnosis not present

## 2024-01-29 DIAGNOSIS — R0602 Shortness of breath: Secondary | ICD-10-CM | POA: Insufficient documentation

## 2024-01-29 DIAGNOSIS — G4733 Obstructive sleep apnea (adult) (pediatric): Secondary | ICD-10-CM | POA: Diagnosis not present

## 2024-01-29 DIAGNOSIS — I428 Other cardiomyopathies: Secondary | ICD-10-CM | POA: Insufficient documentation

## 2024-01-29 DIAGNOSIS — I5022 Chronic systolic (congestive) heart failure: Secondary | ICD-10-CM | POA: Diagnosis not present

## 2024-01-29 DIAGNOSIS — I472 Ventricular tachycardia, unspecified: Secondary | ICD-10-CM | POA: Diagnosis not present

## 2024-01-29 DIAGNOSIS — E66812 Obesity, class 2: Secondary | ICD-10-CM | POA: Insufficient documentation

## 2024-01-29 DIAGNOSIS — I493 Ventricular premature depolarization: Secondary | ICD-10-CM | POA: Insufficient documentation

## 2024-01-29 DIAGNOSIS — Z7982 Long term (current) use of aspirin: Secondary | ICD-10-CM | POA: Diagnosis not present

## 2024-01-29 DIAGNOSIS — I10 Essential (primary) hypertension: Secondary | ICD-10-CM | POA: Diagnosis not present

## 2024-01-29 DIAGNOSIS — I110 Hypertensive heart disease with heart failure: Secondary | ICD-10-CM | POA: Diagnosis not present

## 2024-01-29 DIAGNOSIS — Z79899 Other long term (current) drug therapy: Secondary | ICD-10-CM | POA: Diagnosis not present

## 2024-01-29 DIAGNOSIS — Z7984 Long term (current) use of oral hypoglycemic drugs: Secondary | ICD-10-CM | POA: Insufficient documentation

## 2024-01-29 DIAGNOSIS — Z6841 Body Mass Index (BMI) 40.0 and over, adult: Secondary | ICD-10-CM | POA: Diagnosis not present

## 2024-01-29 DIAGNOSIS — F17210 Nicotine dependence, cigarettes, uncomplicated: Secondary | ICD-10-CM | POA: Insufficient documentation

## 2024-01-29 NOTE — Patient Instructions (Addendum)
 Great to see you today!!!  No changes, please continue current medications  You have been referred to [facility] team for weight loss medication, they will call you to discuss  Your physician has requested that you have an echocardiogram. Echocardiography is a painless test that uses sound waves to create images of your heart. It provides your doctor with information about the size and shape of your heart and how well your heart's chambers and valves are working. This procedure takes approximately one hour. There are no restrictions for this procedure. Please do NOT wear cologne, perfume, aftershave, or lotions (deodorant is allowed). Please arrive 15 minutes prior to your appointment time.  Please note: We ask at that you not bring children with you during ultrasound (echo/ vascular) testing. Due to room size and safety concerns, children are not allowed in the ultrasound rooms during exams. Our front office staff cannot provide observation of children in our lobby area while testing is being conducted. An adult accompanying a patient to their appointment will only be allowed in the ultrasound room at the discretion of the ultrasound technician under special circumstances. We apologize for any inconvenience.   Your physician recommends that you schedule a follow-up appointment in: 6 months

## 2024-01-29 NOTE — Addendum Note (Signed)
Encounter addended by: Dolores Patty, MD on: 01/29/2024 4:47 PM  Actions taken: Clinical Note Signed

## 2024-01-29 NOTE — Progress Notes (Signed)
 Electrophysiology Office Follow up Visit Note:    Date:  01/30/2024   ID:  Alan Coffey, DOB [birth year], MRN [id number]  PCP:  Alan Wilbert [name], NP  [facility] Cardiologist:  None  [facility] Electrophysiologist:  OLE ONEIDA HOLTS, MD    Interval History:     Alan Coffey is a 58 y.o. male who presents for a follow up visit.  The patient Alan Coffey in clinic December 11, 2023.  The patient has a history of hypertension, nonischemic cardiomyopathy, and frequent PVCs.  His last EF was 25%.  The patient had a cardiac MRI which showed basal septal and mid inferior wall LGE.  PET scan in January 2024 showed no evidence of sarcoid.  The patient was previously trialed on mexiletine but this did not have an impact on the frequency of his PVCs.  Catheter ablation and amiodarone  were then discussed with the patient but the patient preferred a noninvasive strategy so amiodarone  was initiated.  Despite treatment with amiodarone  he continued to have frequent PVCs.  He is referred back to discuss possible catheter ablation.  The patient saw Dr. Cherrie on January 29, 2024 for follow-up.  Today he is doing well.  He is working on weight loss, increasing aerobic exercise and treating his sleep apnea.  He has a CPap machine but does not use it routinely.       Past medical, surgical, social and family history were reviewed.  ROS:   Please see the history of present illness.    All other systems reviewed and are negative.  EKGs/Labs/Other Studies Reviewed:    The following studies were reviewed today:  December 30, 2023 ZIO monitor personally reviewed shows PVC burden of 18%  December lab work shows liver and thyroid  levels within normal limits  September 08, 2022 echo EF 40% RV function normal  December 11, 2023 EKG shows sinus rhythm, no PVCs, Q-wave in lead III  August 15, 2023 EKG shows sinus rhythm, frequent PVCs, inferior axis with positive concordance  EKG  Interpretation Date/Time:  Wednesday January 30 2024 11:18:37 EST Ventricular Rate:  64 PR Interval:  198 QRS Duration:  94 QT Interval:  420 QTC Calculation: 433 R Axis:   35  Text Interpretation: Normal sinus rhythm Confirmed by Holts Ole [phone]) on 01/30/2024 12:05:50 PM    Physical Exam:    VS:  BP (!) 142/78   Pulse 64   Ht 5' 8 (1.727 m)   Wt 275 lb 6.4 oz (124.9 kg)   SpO2 94%   BMI 41.87 kg/m     Wt Readings from Last 3 Encounters:  01/30/24 275 lb 6.4 oz (124.9 kg)  01/29/24 274 lb (124.3 kg)  12/11/23 265 lb 6.4 oz (120.4 kg)     GEN: no distress CARD: RRR, No MRG RESP: No IWOB. CTAB.      ASSESSMENT:    1. Chronic systolic heart failure (HCC)   2. PVC (premature ventricular contraction)   3. High risk medication use    PLAN:    In order of problems listed above:  #Frequent PVCs #Chronic systolic heart failure #High risk med monitoring-amiodarone  Monomorphic.  Suspect originating in the LVOT/AMC.   Today there are no PVCs on EKG or prolonged auscultation.  I suspect we are getting an improved amiodarone  level and affect.  Agree with Dr. Nelle assessment regarding importance of weight loss and sleep apnea treatment.  For now, continue amiodarone .  Plan to see him back in about 6  months for amiodarone  monitoring.  No plans for catheter ablation at this time.  We did discuss catheter ablation in detail during today's visit.  Follow-up 6 months with EP APP   Signed, Ole Holts, MD, [facility], [facility] 01/30/2024 12:11 PM    Electrophysiology [facility]

## 2024-01-29 NOTE — Progress Notes (Addendum)
 ADVANCED HF CLINIC NOTE   Primary Care: Alan Wilbert BIRCH, NP HF Cardiologist: Alan Coffey  HPI: Alan Coffey is a 58 y.o. with HTN, HL, and systolic heart failure due to NICM.  Admitted 5/23 with new acute CHF. Echo EF 25-30%, RV normal, Grade II DD. R/LHC showed with mild nonobstructive CAD, low filling pressures with moderate to severely reduced CO. cMRI: LVEF 21%, RVEF 35%, mid-myocardial LGE basal septum and mid inferior wall. Felt CM possible prior viral myocarditis vs cardiac sarcoidosis.GDMT started, mexiletine added for PVC suppression. He was discharged home, weight 244.6 lbs.  PET scan 1/24 at [facility] EF 42% no sarcoid   Has been on mexilitene for PVC suppression Zio 7/24 -> 39.6% PVcs. Was seen by Alan Coffey 07/30/23 switched to amio   Here for routine follow-up. Feels good. Gets SOB when bending over. No SOB with walking. No edema, orthopnea or PND. Saw Alan Coffey in 12/24 in EP Clinic. Zio repeated. PVC burden 18.4% (2 morphologies 18.3% and 0.1% respectively). Denies palpitations.   Cardiac Studies: - cMRI (5/23):  LVEF 21%, RVEF 35%, mid-myocardial LGE basal septum and mid inferior wall.   - R/LHC (5/23): minimal CAD.    Ao = 83/65 (71) LV = 98/9 RA = 2 RV = 20/7 PA = 20/12 (15) PCW = 5 Fick cardiac output/index = 4.1/1.9 PVR = 2.4 WU SVR = 1,244 Ao sat = 95% PA sat = 60%, 61%  - Echo (5/23): EF 25-30%, RV okay.  ROS: All systems reviewed and negative except as per HPI.   Past Medical History:  Diagnosis Date   Arthritis    Dry skin    on chest using goldbond lotion on   Hypertension    OA (osteoarthritis)    Sciatic nerve pain right comes and goes   Current Outpatient Medications  Medication Sig Dispense Refill   acetaminophen  (TYLENOL ) 325 MG tablet Take 325 mg by mouth every 6 (six) hours as needed for moderate pain or headache.     amiodarone  (PACERONE ) 200 MG tablet Take 200 mg by mouth daily.     amLODipine  (NORVASC ) 5 MG tablet Take 1 tablet  (5 mg total) by mouth daily. 90 tablet 3   atorvastatin  (LIPITOR) 40 MG tablet TAKE 1 TABLET DAILY 30 tablet 5   carvedilol  (COREG ) 12.5 MG tablet TAKE 1 TABLET (12.5MG  TOTAL) BY MOUTH TWICE A DAY WITH MEALS 60 tablet 11   diphenhydrAMINE  (BENADRYL ) 25 MG tablet Take 25 mg by mouth every 6 (six) hours as needed for allergies.     eplerenone  (INSPRA ) 25 MG tablet Take 1 tablet (25 mg total) by mouth daily. 90 tablet 3   FARXIGA  10 MG TABS tablet Take 1 tablet (10 mg total) by mouth daily. 90 tablet 3   furosemide  (LASIX ) 20 MG tablet TAKE 1 TABLET EVERY DAY AS NEEDED FOR EDEMA OR FLUID 30 tablet 2   Hypromellose (ALZAIR ALLERGY  NASAL SPRAY NA) Place 1 spray into both nostrils at bedtime.     sacubitril -valsartan  (ENTRESTO ) 97-103 MG Take 1 tablet by mouth 2 (two) times daily. 180 tablet 3   sildenafil  (VIAGRA ) 100 MG tablet Take 1 tablet (100 mg total) by mouth daily as needed. TAKE 1/2 TABLET (50MG ) TO 1 WHOLE TABLET (100MG ) DAILY AS NEEDED 20 tablet 3   No current facility-administered medications for this encounter.   Allergies  Allergen Reactions   Spironolactone  Other (See Comments)    gynecomastia    Social History   Socioeconomic History  Marital status: Divorced    Spouse name: Not on file   Number of children: Not on file   Years of education: Not on file   Highest education level: Not on file  Occupational History   Not on file  Tobacco Use   Smoking status: Every Day    Current packs/day: 0.50    Average packs/day: 0.5 packs/day for 37.0 years (18.5 ttl pk-yrs)    Types: Cigarettes   Smokeless tobacco: Never  Vaping Use   Vaping status: Never Used  Substance and Sexual Activity   Alcohol use: Never   Drug use: Never   Sexual activity: Not on file  Other Topics Concern   Not on file  Social History Narrative   Not on file   Social Drivers of Health   Financial Resource Strain: Medium Risk (05/31/2022)   Overall Financial Resource Strain (CARDIA)    Difficulty  of Paying Living Expenses: Somewhat hard  Food Insecurity: No Food Insecurity (05/31/2022)   Hunger Vital Sign    Worried About Running Out of Food in the Last Year: Never true    Ran Out of Food in the Last Year: Never true  Transportation Needs: Not on file  Physical Activity: Not on file  Stress: Not on file  Social Connections: Not on file  Intimate Partner Violence: Not on file   Family History  Problem Relation Age of Onset   Heart failure Father    Heart attack Brother    BP 130/80   Pulse 72   Wt 124.3 kg (274 lb)   SpO2 95%   BMI 41.66 kg/m   Wt Readings from Last 3 Encounters:  01/29/24 124.3 kg (274 lb)  12/11/23 120.4 kg (265 lb 6.4 oz)  08/15/23 114 kg (251 lb 6.4 oz)   PHYSICAL EXAM: General:  Well appearing. No resp difficulty HEENT: normal Neck: supple. no JVD. Carotids 2+ bilat; no bruits. No lymphadenopathy or thryomegaly appreciated. Cor: PMI nondisplaced. Regular rate & rhythm. + PVCs No rubs, gallops or murmurs. Lungs: clear Abdomen: obese soft, nontender, nondistended. No hepatosplenomegaly. No bruits or masses. Good bowel sounds. Extremities: no cyanosis, clubbing, rash, edema Neuro: alert & orientedx3, cranial nerves grossly intact. moves all 4 extremities w/o difficulty. Affect pleasant     ASSESSMENT & PLAN:  Chronic Systolic Heart Failure: - Echo (5/23): EF 25-30%, RV okay. Nonischemic cardiomyopathy.  - R/LHC (5/23): with mild nonobstructive CAD, low filling pressures with moderate to severely reduced CO.  - Cardiac MRI (5/23): LVEF 21%, RVEF 35%, mid-myocardial LGE basal septum and mid inferior wall, ECV elevated mid inferoseptal (43%) and severely elevated basal inferoseptal (63%) possible sarcoidosis - Echo 09/08/22: EF 40-45%, RV okay - PET scan 1/24 at [facility] EF 42% no sarcoid. /. If MRI related to myocarditis  - Stable NYHA I-II - Volume status ok - Continue Entresto  to 97/103 mg bid.  - Continue Lasix  20 mg daily PRN. - Continue  eplerenone  25 daily (had gynecomastia with spiro)  - Continue Farxiga  10 mg daily.  - Continue carvedilol  12.5 mg BID - On good GDMT. Will continue   2. HTN  - Blood pressure well controlled. Continue current regimen.  3. Tobacco Abuse - Remains quit    5. PVCs/NSVT  - Multifocal PVCs.  - Has been on mexilitene for PVC suppression Zio 7/24 -> 39.6% PVcs. Was seen by Alan Coffey 07/30/23 switched to amio  - Still with frequent PVCs despite amio. Monitor 12/24 18.4% PVCs - Sees Alan Coffey  tomorrow to discuss ablation  - Need to be aggressive treating OSA as potential underlying trigger.    6. Non-obstructive CAD - Very mild.  - No s/s angina - Continue statin/ASA  7. OSA - Severe, AHI 44/hr (6/23). - Has not started CPAP due to cost - Wearing CPAP intermittently. Encouraged more use   8. Morbid obesity  -Body mass index is 41.66 kg/m. - PCP started Tzhncb but he couldn't afford it  - Have asked PharmD to see if they can help   Toribio Fuel, MD  4:08 PM

## 2024-01-30 ENCOUNTER — Ambulatory Visit: Payer: Medicare HMO | Attending: Cardiology | Admitting: Cardiology

## 2024-01-30 ENCOUNTER — Encounter: Payer: Self-pay | Admitting: Cardiology

## 2024-01-30 VITALS — BP 142/78 | HR 64 | Ht 68.0 in | Wt 275.4 lb

## 2024-01-30 DIAGNOSIS — I5022 Chronic systolic (congestive) heart failure: Secondary | ICD-10-CM | POA: Diagnosis not present

## 2024-01-30 DIAGNOSIS — Z79899 Other long term (current) drug therapy: Secondary | ICD-10-CM | POA: Diagnosis not present

## 2024-01-30 DIAGNOSIS — I493 Ventricular premature depolarization: Secondary | ICD-10-CM

## 2024-01-30 NOTE — Patient Instructions (Signed)
 Medication Instructions:  Your physician recommends that you continue on your current medications as directed. Please refer to the Current Medication list given to you today.  *If you need a refill on your cardiac medications before your next appointment, please call your pharmacy*  Follow-Up: At [facility], you and your health needs are our priority.  As part of our continuing mission to provide you with exceptional heart care, we have created designated Provider Care Teams.  These Care Teams include your primary Cardiologist (physician) and Advanced Practice Providers (APPs -  Physician Assistants and Nurse Practitioners) who all work together to provide you with the care you need, when you need it.   Your next appointment:   6 months  Provider:   You will see one of the following Advanced Practice Providers on your designated Care Team:   Francis Dowse, Charlott Holler [address]" [place], [place] Sherie Don, NP Canary Brim, NP

## 2024-02-09 ENCOUNTER — Other Ambulatory Visit (HOSPITAL_COMMUNITY): Payer: Self-pay | Admitting: Internal Medicine

## 2024-02-12 ENCOUNTER — Other Ambulatory Visit (HOSPITAL_COMMUNITY): Payer: Self-pay | Admitting: Internal Medicine

## 2024-02-13 ENCOUNTER — Ambulatory Visit (HOSPITAL_COMMUNITY)
Admission: RE | Admit: 2024-02-13 | Discharge: 2024-02-13 | Disposition: A | Discharge status: Home / Self Care | Payer: Medicare HMO | Source: Ambulatory Visit | Attending: Internal Medicine | Admitting: Internal Medicine

## 2024-02-13 DIAGNOSIS — I110 Hypertensive heart disease with heart failure: Secondary | ICD-10-CM | POA: Diagnosis not present

## 2024-02-13 DIAGNOSIS — I5022 Chronic systolic (congestive) heart failure: Secondary | ICD-10-CM | POA: Insufficient documentation

## 2024-02-13 LAB — ECHOCARDIOGRAM COMPLETE
AR max vel: 1.73 cm2
AV Area VTI: 1.8 cm2
AV Area mean vel: 1.54 cm2
AV Mean grad: 5 mm[Hg]
AV Peak grad: 8 mm[Hg]
Ao pk vel: 1.41 m/s
Area-P 1/2: 4.68 cm2
Calc EF: 48.9 %
MV VTI: 2.7 cm2
S' Lateral: 4.3 cm
Single Plane A2C EF: 43 %
Single Plane A4C EF: 51.1 %

## 2024-02-18 ENCOUNTER — Other Ambulatory Visit: Payer: Self-pay | Admitting: Family Medicine

## 2024-02-18 DIAGNOSIS — Z122 Encounter for screening for malignant neoplasm of respiratory organs: Secondary | ICD-10-CM

## 2024-03-14 ENCOUNTER — Encounter: Payer: Self-pay | Admitting: Family Medicine

## 2024-03-18 ENCOUNTER — Ambulatory Visit
Admission: RE | Admit: 2024-03-18 | Discharge: 2024-03-18 | Discharge status: Home / Self Care | Payer: Medicare HMO | Source: Ambulatory Visit | Attending: Family Medicine | Admitting: Family Medicine

## 2024-03-18 DIAGNOSIS — Z122 Encounter for screening for malignant neoplasm of respiratory organs: Secondary | ICD-10-CM

## 2024-03-20 ENCOUNTER — Telehealth: Payer: Self-pay | Admitting: Pharmacy Technician

## 2024-03-20 ENCOUNTER — Other Ambulatory Visit (HOSPITAL_COMMUNITY): Payer: Self-pay

## 2024-03-20 ENCOUNTER — Ambulatory Visit: Payer: Medicare HMO | Attending: Cardiology | Admitting: Pharmacist

## 2024-03-20 ENCOUNTER — Telehealth: Payer: Self-pay | Admitting: Pharmacist

## 2024-03-20 ENCOUNTER — Encounter: Payer: Self-pay | Admitting: Pharmacist

## 2024-03-20 VITALS — Ht 68.0 in | Wt 250.0 lb

## 2024-03-20 DIAGNOSIS — G4733 Obstructive sleep apnea (adult) (pediatric): Secondary | ICD-10-CM | POA: Diagnosis not present

## 2024-03-20 DIAGNOSIS — E66812 Obesity, class 2: Secondary | ICD-10-CM

## 2024-03-20 NOTE — Progress Notes (Signed)
 Patient ID: Alan Coffey                 DOB: September 16, 1966                    MRN: [id number]     HPI: Alan Coffey is a 58 y.o. male patient referred to pharmacy clinic by Dr.Bensimhon to initiate GLP1-RA therapy. 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 and obesity. Most recent BMI 38.01 kg/m .  Baseline weight and BMI: 275 lbs 41.88 kg/m  Current weight and BMI: 250 lbs 38.01  kg/m  Current meds that affect weight: Farxiga 10 mg daily  Goal weight : 200   Patient presented today for weight management clinic. Reports his PCP had prescribed him Ozempic for prediabetes but his co-pay was ~$250 and he could not afford it. He had tried raw fruits and raw vegetables diet for 20 days and lost 24 lbs. His aim is to loose some more weight and continue following healthy diet. It was hard for him to follow same diet ( only raw veg and fruit) as he was energy less when he was on it. He resumed gains and protein in his diet but notice his weight started going up. He likes see food but only in fried form so he stays away from it.    Diet:  Breakfast: egg, coffee Lunch- salad with grilled chicken with small amount of ranch, burger or sub Dinner: peanut butter crackers  Snacks: chips, banana pudding, banana, grapes   Drink: water,half and half tea twice a week and soda twice a week      Exercise: walks 3 - 5 miles per day    Family History:  Relation Problem Comments  Father Heart failure     Brother Heart attack     Social History:  Alcohol: 2 beers per week only socially Smoking: quit 3 years ago   Labs: Lab Results  Component Value Date   HGBA1C 5.4 05/12/2022    Wt Readings from Last 1 Encounters:  01/30/24 275 lb 6.4 oz (124.9 kg)    BP Readings from Last 1 Encounters:  01/30/24 (!) 142/78   Pulse Readings from Last 1 Encounters:  01/30/24 64       Component Value Date/Time   CHOL 230 (H)  05/10/2022 0334   TRIG 208 (H) 05/10/2022 0334   HDL 35 (L) 05/10/2022 0334   CHOLHDL 6.6 05/10/2022 0334   VLDL 42 (H) 05/10/2022 0334   LDLCALC 153 (H) 05/10/2022 0334    Past Medical History:  Diagnosis Date   Arthritis    Dry skin    on chest using goldbond lotion on   Hypertension    OA (osteoarthritis)    Sciatic nerve pain right comes and goes    Current Outpatient Medications on File Prior to Visit  Medication Sig Dispense Refill   acetaminophen (TYLENOL) 325 MG tablet Take 325 mg by mouth every 6 (six) hours as needed for moderate pain or headache.     amiodarone (PACERONE) 200 MG tablet Take 200 mg by mouth daily.     amLODipine (NORVASC) 5 MG tablet TAKE 1 TABLET (5 MG TOTAL) BY MOUTH DAILY. 90 tablet 3   atorvastatin (LIPITOR) 40 MG tablet TAKE 1 TABLET DAILY 30 tablet 5   carvedilol (COREG) 12.5 MG tablet TAKE 1 TABLET (12.5MG  TOTAL) BY MOUTH TWICE A DAY WITH MEALS 60 tablet 11  diphenhydrAMINE (BENADRYL) 25 MG tablet Take 25 mg by mouth every 6 (six) hours as needed for allergies.     eplerenone (INSPRA) 25 MG tablet TAKE 1 TABLET (25 MG TOTAL) BY MOUTH DAILY. 30 tablet 11   FARXIGA 10 MG TABS tablet Take 1 tablet (10 mg total) by mouth daily. 90 tablet 3   furosemide (LASIX) 20 MG tablet TAKE 1 TABLET EVERY DAY AS NEEDED FOR EDEMA OR FLUID 30 tablet 2   Hypromellose (ALZAIR ALLERGY NASAL SPRAY NA) Place 1 spray into both nostrils at bedtime.     sacubitril-valsartan (ENTRESTO) 97-103 MG Take 1 tablet by mouth 2 (two) times daily. 180 tablet 3   sildenafil (VIAGRA) 100 MG tablet Take 1 tablet (100 mg total) by mouth daily as needed. TAKE 1/2 TABLET (50MG ) TO 1 WHOLE TABLET (100MG ) DAILY AS NEEDED 20 tablet 3   No current facility-administered medications on file prior to visit.    Allergies  Allergen Reactions   Spironolactone Other (See Comments)    gynecomastia     Assessment/Plan:  1. Weight loss - Patient has not met goal of at least 5% of body weight  loss with comprehensive lifestyle modifications alone in the past 3 months. Pharmacotherapy is appropriate to pursue as augmentation. Will start coverage assessment for Zepbound. Confirmed patient not has no personal or family history of medullary thyroid carcinoma (MTC) or Multiple Endocrine Neoplasia syndrome type 2 (MEN 2). Injection technique reviewed at today's visit.  Advised patient on common side effects including nausea, diarrhea, dyspepsia, decreased appetite, and fatigue. Counseled patient on reducing meal size and how to titrate medication to minimize side effects. Counseled patient to call if intolerable side effects or if experiencing dehydration, abdominal pain, or dizziness. Patient will adhere to dietary modifications and will target at least 150 minutes of moderate intensity exercise weekly.   Follow up in 1 month via telephone for tolerability update and dose titration.

## 2024-03-20 NOTE — Telephone Encounter (Signed)
 Pharmacy Patient Advocate Encounter   Received notification from Pt Calls Messages that prior authorization for zepbound is required/requested.   Insurance verification completed.   The patient is insured through [employer] .   Per test claim: PA required; PA submitted to above mentioned insurance via CoverMyMeds Key/confirmation #/EOC [id number] Status is pending

## 2024-03-21 ENCOUNTER — Other Ambulatory Visit (HOSPITAL_COMMUNITY): Payer: Self-pay

## 2024-03-21 NOTE — Telephone Encounter (Signed)
 Pharmacy Patient Advocate Encounter  Received notification from [employer] that Prior Authorization for Zepbound has been APPROVED from 12/26/23 to 12/24/24. Ran test claim, Copay is $279.24- one month. This test claim was processed through [facility]- copay amounts may vary at other pharmacies due to pharmacy/plan contracts, or as the patient moves through the different stages of their insurance plan.   PA #/Case ID/Reference #: [id number]

## 2024-03-25 NOTE — Telephone Encounter (Signed)
 Inform patient about PA approval. He wants to continue with lifestyle interventions (balanced diet and regular physical activity) for one more month. If he decide to go on Zepbound he will call us back in a month.

## 2024-04-09 ENCOUNTER — Encounter: Payer: Self-pay | Admitting: Orthopaedic Surgery

## 2024-04-09 ENCOUNTER — Ambulatory Visit: Admitting: Orthopaedic Surgery

## 2024-04-09 ENCOUNTER — Other Ambulatory Visit (INDEPENDENT_AMBULATORY_CARE_PROVIDER_SITE_OTHER): Payer: Self-pay

## 2024-04-09 VITALS — Ht 68.0 in | Wt 264.0 lb

## 2024-04-09 DIAGNOSIS — M25561 Pain in right knee: Secondary | ICD-10-CM

## 2024-04-09 DIAGNOSIS — G8929 Other chronic pain: Secondary | ICD-10-CM

## 2024-04-09 MED ORDER — LIDOCAINE HCL 1 % IJ SOLN
3.0000 mL | INTRAMUSCULAR | Status: AC | PRN
  Administered 2024-04-09: 3 mL

## 2024-04-09 MED ORDER — METHYLPREDNISOLONE ACETATE 40 MG/ML IJ SUSP
40.0000 mg | INTRAMUSCULAR | Status: AC | PRN
  Administered 2024-04-09: 40 mg via INTRA_ARTICULAR

## 2024-04-09 NOTE — Progress Notes (Signed)
 The patient is well-known to us .  He is an active 58 year old gentleman who we have replaced both of his hips.  He has been having some right knee pain with locking and catching as well as swelling for several months now.  He gets popping in the knee and pain in the back of his knee as well as medial is where he points to.  He said last week his knee was swollen significantly but that has gone down some.  He does a lot of walking and is painful to walk.  He has otherwise had no acute changes in medical status.  His most recent BMI is 40.14.  He has never had surgery or injections on his right knee.  Examination of his right knee shows a slight effusion.  It is slightly warm.  There is varus malalignment that is easily correctable.  There is not a lot of patellofemoral crepitation but he does have medial joint line tenderness throughout the arc of motion of his knee.  His Lachman's and McMurray's were negative today.  I was able to aspirate about 20 cc of fluid off of his right knee that was clear fluid.  I then placed a steroid injection in his knee that he tolerated well.  He will continue his exercise regimen and working on quad strengthening exercises as well as walking.  He will avoid hills.  We will see him back in 3 weeks to see what response he has had from this injection.    Procedure Note  Patient: Alan Coffey             Date of Birth: [birth year]           MRN: [id number]             Visit Date: 04/09/2024  Procedures: Visit Diagnoses:  1. Chronic pain of right knee     Large Joint Inj: R knee on 04/09/2024 9:01 AM Indications: diagnostic evaluation and pain Details: 22 G 1.5 in needle, superolateral approach  Arthrogram: No  Medications: 3 mL lidocaine 1 %; 40 mg methylPREDNISolone acetate 40 MG/ML Outcome: tolerated well, no immediate complications Procedure, treatment alternatives, risks and benefits explained, specific risks discussed. Consent was given by the patient.  Immediately prior to procedure a time out was called to verify the correct patient, procedure, equipment, support staff and site/side marked as required. Patient was prepped and draped in the usual sterile fashion.

## 2024-04-18 NOTE — Telephone Encounter (Signed)
 PA approved see other encounter for more info

## 2024-04-21 ENCOUNTER — Ambulatory Visit: Payer: 59 | Admitting: Physician Assistant

## 2024-04-22 ENCOUNTER — Other Ambulatory Visit: Payer: Self-pay | Admitting: Cardiology

## 2024-04-29 MED ORDER — ZEPBOUND 2.5 MG/0.5ML ~~LOC~~ SOAJ: 2.5000 mg | SUBCUTANEOUS | 0 refills | Status: DC

## 2024-04-29 MED ORDER — ZEPBOUND 5 MG/0.5ML ~~LOC~~ SOAJ: 5.0000 mg | SUBCUTANEOUS | 0 refills | Status: DC

## 2024-04-29 NOTE — Telephone Encounter (Signed)
 Patient called and states he would like to start Zepbound. 2.5 mg and 5 mg prescription sent to the pharmacy. Will f/u via phone in 6 -8 weeks.

## 2024-04-29 NOTE — Addendum Note (Signed)
 Addended by: Ashlye Oviedo K on: 04/29/2024 01:14 PM   Modules accepted: Orders

## 2024-04-30 ENCOUNTER — Ambulatory Visit: Admitting: Orthopaedic Surgery

## 2024-04-30 ENCOUNTER — Encounter: Payer: Self-pay | Admitting: Orthopaedic Surgery

## 2024-04-30 ENCOUNTER — Other Ambulatory Visit: Payer: Self-pay

## 2024-04-30 DIAGNOSIS — M25561 Pain in right knee: Secondary | ICD-10-CM | POA: Diagnosis not present

## 2024-04-30 DIAGNOSIS — G8929 Other chronic pain: Secondary | ICD-10-CM

## 2024-04-30 NOTE — Progress Notes (Signed)
 The patient comes in for follow-up having dealt with right knee pain, locking and catching for some time now.  He has worked on weight loss and activity modification as well as taking anti-inflammatories and worked on Dance movement psychotherapist exercises.  We did place a steroid injection in his right knee and that was great for about 2 weeks but now he is having the same symptoms again with his right knee.  His previous x-rays showed just slight medial joint space narrowing and slight varus malalignment of the knee.  On exam today he has a positive McMurray's exam to the medial compartment of the knee and continued pain and tenderness.  He has a limp with weightbearing.  At this point a MRI is warranted medically of his right knee to assess the cartilage and to rule out a meniscal tear of the right knee.  He agrees with this treatment plan and we will see him back in follow-up after his that MRI.  Will avoid high impact aerobic activities but we will continue quad strengthening exercises and weight loss in the interim.

## 2024-05-08 ENCOUNTER — Encounter: Payer: Self-pay | Admitting: Orthopaedic Surgery

## 2024-05-09 ENCOUNTER — Other Ambulatory Visit: Payer: Self-pay | Admitting: Cardiology

## 2024-05-17 ENCOUNTER — Ambulatory Visit
Admission: RE | Admit: 2024-05-17 | Discharge: 2024-05-17 | Disposition: A | Discharge status: Home / Self Care | Source: Ambulatory Visit | Attending: Orthopaedic Surgery | Admitting: Orthopaedic Surgery

## 2024-05-17 DIAGNOSIS — G8929 Other chronic pain: Secondary | ICD-10-CM

## 2024-06-02 ENCOUNTER — Encounter: Payer: Self-pay | Admitting: Physician Assistant

## 2024-06-02 ENCOUNTER — Ambulatory Visit (INDEPENDENT_AMBULATORY_CARE_PROVIDER_SITE_OTHER): Admitting: Physician Assistant

## 2024-06-02 DIAGNOSIS — S83241D Other tear of medial meniscus, current injury, right knee, subsequent encounter: Secondary | ICD-10-CM | POA: Diagnosis not present

## 2024-06-02 NOTE — Progress Notes (Signed)
 HPI: Alan Coffey returns today to go over the MRI of his right knee.  He continues to have right knee pain with catching and locking at times.  Most of his pain is medial aspect of the knee. MRI images of right knee are reviewed.  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  Medial meniscus posterior horn body tear with slight medial displacement of body.  Ligaments are all intact.  Moderate Baker's cyst.  Small reactive joint effusion.  Impression: Right knee medial meniscal tear.  Plan: Given patient's failure of conservative treatment which has included time injection/aspiration and activity modification recommend MRI of right knee for medial meniscal tear.  This week knee arthroscopy with chondroplasty and partial medial meniscectomy.  Risk benefits surgery discussed with patient.  Did discuss with him also that this will not address the arthritis in his knee only the meniscal tear.  He most likely will require future cortisone plus minus viscosupplementation injections.  Will work on scheduling the near future.  Questions were encouraged and answered.

## 2024-06-08 ENCOUNTER — Other Ambulatory Visit (HOSPITAL_COMMUNITY): Payer: Self-pay | Admitting: Cardiology

## 2024-06-09 NOTE — Telephone Encounter (Signed)
 Patient is cb to f/u regarding this. He states no one answers at the number he was previously given. Please advise.

## 2024-06-10 MED ORDER — ZEPBOUND 7.5 MG/0.5ML ~~LOC~~ SOAJ: 7.5000 mg | SUBCUTANEOUS | 0 refills | Status: DC

## 2024-06-10 MED ORDER — ZEPBOUND 10 MG/0.5ML ~~LOC~~ SOAJ: 10.0000 mg | SUBCUTANEOUS | 0 refills | Status: DC

## 2024-06-10 NOTE — Telephone Encounter (Signed)
 Spoke to patient tolerates current dose of Zepbound  ready to move on to the next step. So far lost 8 lbs. Reiterated importance of regular exercise and healthy diet. Prescription for 7.5 mg and 10 mg Zepbound  sent to the pharmacy. Will follow up in 7-8 weeks for further dose titration

## 2024-06-10 NOTE — Telephone Encounter (Signed)
 LVM to call back at [phone]

## 2024-06-10 NOTE — Addendum Note (Signed)
 Addended by: Laroy Mustard K on: 06/10/2024 02:34 PM   Modules accepted: Orders

## 2024-06-23 ENCOUNTER — Other Ambulatory Visit: Payer: Self-pay

## 2024-06-23 DIAGNOSIS — S83241D Other tear of medial meniscus, current injury, right knee, subsequent encounter: Secondary | ICD-10-CM

## 2024-06-23 DIAGNOSIS — Z9889 Other specified postprocedural states: Secondary | ICD-10-CM

## 2024-06-26 ENCOUNTER — Ambulatory Visit: Admitting: Physical Therapy

## 2024-06-26 ENCOUNTER — Encounter: Payer: Self-pay | Admitting: Physical Therapy

## 2024-06-26 DIAGNOSIS — G8929 Other chronic pain: Secondary | ICD-10-CM | POA: Diagnosis not present

## 2024-06-26 DIAGNOSIS — M6281 Muscle weakness (generalized): Secondary | ICD-10-CM

## 2024-06-26 DIAGNOSIS — R600 Localized edema: Secondary | ICD-10-CM

## 2024-06-26 DIAGNOSIS — M25561 Pain in right knee: Secondary | ICD-10-CM | POA: Diagnosis not present

## 2024-06-26 DIAGNOSIS — R29898 Other symptoms and signs involving the musculoskeletal system: Secondary | ICD-10-CM

## 2024-06-26 NOTE — Therapy (Signed)
 OUTPATIENT PHYSICAL THERAPY LOWER EXTREMITY EVALUATION   Patient Name: Alan Coffey MRN: [id number] DOB:1966/04/24, 58 y.o., male Today's Date: 06/26/2024  END OF SESSION:  PT End of Session - 06/26/24 9077     Visit Number 1    Number of Visits 4    Date for PT Re-Evaluation 08/12/24    Authorization Type [employer] Medicare HMO/PPO    Authorization Time Period $30 co-pay    PT Start Time [phone]    PT Stop Time 0920    PT Time Calculation (min) 43 min    Activity Tolerance Patient tolerated treatment well;Patient limited by pain    Behavior During Therapy [facility] for tasks assessed/performed          Past Medical History:  Diagnosis Date   Arthritis    Dry skin    on chest using goldbond lotion on   Hypertension    OA (osteoarthritis)    Sciatic nerve pain right comes and goes   Past Surgical History:  Procedure Laterality Date   cyst removed left shoulder      MASS EXCISION Left 06/11/2020   Procedure: REMOVAL OF LEFT SHOULDER SUBCUTANEOUS MASS;  Surgeon: Sheldon Elspeth, MD;  Location: [facility];  Service: General;  Laterality: Left;  MAC VS GENERAL (ANESTHESIA CHOICE)   RIGHT/LEFT HEART CATH AND CORONARY ANGIOGRAPHY N/A 05/11/2022   Procedure: RIGHT/LEFT HEART CATH AND CORONARY ANGIOGRAPHY;  Surgeon: Cherrie Toribio SAUNDERS, MD;  Location: [facility];  Service: Cardiovascular;  Laterality: N/A;   TOTAL HIP ARTHROPLASTY Left 07/01/2021   Procedure: LEFT TOTAL HIP ARTHROPLASTY ANTERIOR APPROACH;  Surgeon: Vernetta Lonni GRADE, MD;  Location: [facility];  Service: Orthopedics;  Laterality: Left;   TOTAL HIP ARTHROPLASTY Right 10/14/2021   Procedure: RIGHT TOTAL HIP ARTHROPLASTY ANTERIOR APPROACH;  Surgeon: Vernetta Lonni GRADE, MD;  Location: [facility];  Service: Orthopedics;  Laterality: Right;   Patient Active Problem List   Diagnosis Date Noted   Hypokalemia 05/13/2022   Acute clinical systolic heart failure (HCC) 05/10/2022   HTN (hypertension)  05/10/2022   HLD (hyperlipidemia) 05/10/2022   Class 2 obesity 05/10/2022   Status post total replacement of right hip 10/14/2021   Status post left hip replacement 07/01/2021   Mass of soft tissue of left upper extremity 03/30/2020    PCP: Anice Wilbert [name], NP  REFERRING PROVIDER: Vernetta Lonni GRADE*  REFERRING DIAG:  D16.758I (ICD-10-CM) - Other tear of medial meniscus of right knee, unspecified whether old or current tear, subsequent encounter  Z98.890 (ICD-10-CM) - S/P right knee arthroscopy   THERAPY DIAG:  Chronic pain of right knee  Muscle weakness (generalized)  Other symptoms and signs involving the musculoskeletal system  Localized edema  Rationale for Evaluation and Treatment: Rehabilitation  ONSET DATE:   SUBJECTIVE:   SUBJECTIVE STATEMENT: Patient has history of OA with bilateral THAs in 2022 and was walking 5-6 miles daily until knees started hurting ~ Nov. 2024. He saw Dr. Vernetta on 04/09/2024 with chronic right knee pain.  MRI shows arthritis with medial meniscus tear. His insurance is requiring 6 weeks of PT prior to surgery. He has [employer] investment [employer].    PERTINENT HISTORY: Right THA ant approach 10/22, left THA ant approach 7/22, OA, HTN, sciatic nerve pain  DIAGNOSTIC FINDINGS: 05/17/24 MRI compartmental osteoarthrosis is noted with the medial compartment chondromalacia and reactive edema. There is a moderate-sized reactive joint effusion and a moderate-sized leaking Baker's cyst.   Tear of the posterior horn and body of  the medial meniscus with slight medial displacement of body. No displaced meniscal flap is identified.   Mild laxity of the MCL suggesting a chronic sprain. There are intact fibers.  PAIN:  Are you having pain? Yes: NPRS scale: at rest 0/10, standing 4/10,  first thing morning or sitting >15-30 min 6/10, walking 7-8/10 Pain location:  right knee  anterior knee,  left knee started hurting in last week in popliteal area. Pain  description:  dull, nagging Aggravating factors:  standing & walking.  Relieving factors:  sit down,   PRECAUTIONS: None  RED FLAGS: None   WEIGHT BEARING RESTRICTIONS: No  FALLS:  Has patient fallen in last 6 months? 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  PLOF: Independent and Independent with community mobility without device  PATIENT GOALS:   to get knee better to walk & stand without pain.  NEXT MD VISIT:  tbd  OBJECTIVE:   Patient-Specific Activity Scoring Scheme  0 represents "unable to perform." 10 represents "able to perform at prior level. 0 1 2 3 4 5 6 7 8 9  10 (Date and Score)   Activity Eval     1.  Standing up without pain 3     2.  Standing for 30-60 minutes without pain 3     3.  Walking without pain without pain 3   4.    5.    Score 3    Total score = sum of the activity scores/number of activities Minimum detectable change (90%CI) for average score = 2 points Minimum detectable change (90%CI) for single activity score = 3 points  COGNITION: Overall cognitive status: WFL   EDEMA:  Figure 8:   LLE: 78 cm  RLE: 82 cm  MUSCLE LENGTH: Hamstrings measured with hip flexed to 90*then extending the knee until tension: Right -59* deg;   POSTURE: weight shift left  PALPATION: Tenderness to palpation at patella tendon, quad tendon and anterior joint line.   LOWER EXTREMITY ROM:   ROM Right eval Left eval  Hip flexion    Hip extension    Hip abduction    Hip adduction    Hip internal rotation    Hip external rotation    Knee flexion Supine A: 118*   Knee extension Seated LAQ -3*   Ankle dorsiflexion    Ankle plantarflexion    Ankle inversion    Ankle eversion     (Blank rows = not  tested)  LOWER EXTREMITY MMT:  MMT Right eval Left eval  Hip flexion    Hip extension    Hip abduction    Hip adduction    Hip internal rotation    Hip external rotation    Knee flexion 4/5   Knee extension 4/5   Ankle dorsiflexion    Ankle plantarflexion    Ankle inversion    Ankle eversion     (Blank rows = not tested)  FUNCTIONAL TESTS:  18 inch chair transfer: without UE assist 5 x STS 19.43 sec 0/10 before & 3/10 after  GAIT: Distance walked: >200' Assistive device utilized: None Level of assistance: Modified independence Comments: Antalgic pattern with decreased stance right lower extremity and right knee flexed in stance   TODAY'S TREATMENT  DATE: 06/26/2024: Therapeutic Exercise: HEP instruction/performance c cues for techniques, handout provided.  Trial set performed of each for comprehension and symptom assessment.  See below for exercise list. PT educated patient verbally on a well-rounded exercise program should include flexibility of muscles, strengthening, balance activities and muscle endurance activities.  Walking without performing other types of exercise could be a factor in some pain.  Patient verbalized understanding.  PATIENT EDUCATION:  Education details: HEP, POC Person educated: Patient Education method: Programmer, multimedia, Demonstration, Verbal cues, and Handouts Education comprehension: verbalized understanding, returned demonstration, and verbal cues required  HOME EXERCISE PROGRAM: Access Code: [email or web address] URL: [email or web address] Date: 06/26/2024 Prepared by: Grayce Spatz  Exercises - Supine Active Straight Leg Raise  - 1 x daily - 7 x weekly - 2 sets - 10 reps - 5 seconds hold - Supine Hamstring Stretch with Strap  - 1-2 x daily - 7 x weekly - 1 sets - 3 reps - 20-30 seconds hold - Supine Bridge  - 1 x daily - 7 x weekly - 2 sets - 10 reps - 5 seconds hold -  Seated Straight Leg Raise one leg only  - 1 x daily - 7 x weekly - 2 sets - 10 reps - 5 seconds hold - Seated Hamstring Stretch with Strap  - 1-2 x daily - 7 x weekly - 1 sets - 3 reps - 20-30 seconds hold - squat at sink with chair behind you  - 1 x daily - 7 x weekly - 2 sets - 10 reps - 5 seconds hold  ASSESSMENT: CLINICAL IMPRESSION: Patient is a 58 y.o. who comes to clinic with complaints of right knee pain with meniscus tear & arthritis with mobility, strength and movement coordination deficits that impair their ability to perform usual daily and recreational functional activities without increase difficulty/symptoms at this time.  Patient to benefit from skilled PT services to address impairments and limitations to improve to previous level of function without restriction secondary to condition.    OBJECTIVE IMPAIRMENTS: Abnormal gait, decreased activity tolerance, decreased endurance, decreased strength, impaired flexibility, and pain.   ACTIVITY LIMITATIONS: carrying, lifting, bending, standing, squatting, stairs, transfers, and locomotion level  PARTICIPATION LIMITATIONS: meal prep, cleaning, community activity, occupation, and fitness activities  PERSONAL FACTORS: Time since onset of injury/illness/exacerbation and 3+ comorbidities: see PMH are also affecting patient's functional outcome.   REHAB POTENTIAL: Good  CLINICAL DECISION MAKING: Stable/uncomplicated  EVALUATION COMPLEXITY: Low   GOALS: Goals reviewed with patient? Yes  LONG TERM GOALS: (target dates for all long term goals  08/12/2024 )   1. Patient will demonstrate/report pain at worst less than or equal to 2/10 to facilitate minimal limitation in daily activity secondary to pain symptoms. Baseline: See objective data Goal status: Initial   2. Patient will demonstrate independent use of home exercise program to facilitate ability to maintain/progress functional gains from skilled physical therapy  services. Baseline: See objective data Goal status: Initial  3.  Patient reports Patient-Specific Activity Score improved the average >/= 6 to indicate improvement in functional activities.  Baseline: SEE OBJECTIVE DATA Goal status: INITIAL   4.  Patient will demonstrate right LE MMT 5/5 throughout to faciltiate usual transfers, stairs, squatting at [facility] for daily life.  Baseline: See objective data Goal status: Initial   PLAN:  PT FREQUENCY:  1 x/week every 2 weeks 4 visits  PT DURATION: 6 weeks  PLANNED INTERVENTIONS: 97164- PT Re-evaluation, 97110-Therapeutic exercises, 97530- Therapeutic activity, [id number]- Neuromuscular re-education, 97535- Self Care, 02859-  Manual therapy, [id number]- Gait training, [phone]- Electrical stimulation (unattended), [phone]- Electrical stimulation (manual), 02983- Vasopneumatic device, [id number]- Ionotophoresis 4mg /ml Dexamethasone , 79439 (1-2 muscles), 20561 (3+ muscles)- Dry Needling, Patient/Family education, Balance training, Stair training, Taping, and Joint mobilization  PLAN FOR NEXT SESSION: Review & Update HEP.   Anselm Aumiller, PT, DPT 06/26/2024, 11:22 AM

## 2024-07-15 ENCOUNTER — Ambulatory Visit: Admitting: Physical Therapy

## 2024-07-15 ENCOUNTER — Encounter: Payer: Self-pay | Admitting: Physical Therapy

## 2024-07-15 DIAGNOSIS — M25561 Pain in right knee: Secondary | ICD-10-CM | POA: Diagnosis not present

## 2024-07-15 DIAGNOSIS — G8929 Other chronic pain: Secondary | ICD-10-CM | POA: Diagnosis not present

## 2024-07-15 DIAGNOSIS — R29898 Other symptoms and signs involving the musculoskeletal system: Secondary | ICD-10-CM

## 2024-07-15 DIAGNOSIS — M6281 Muscle weakness (generalized): Secondary | ICD-10-CM

## 2024-07-15 NOTE — Therapy (Addendum)
 OUTPATIENT PHYSICAL THERAPY LOWER EXTREMITY EVALUATION   Patient Name: Alan Coffey MRN: [id number] DOB:Oct 05, 1966, 58 y.o., male Today's Date: 07/15/2024  END OF SESSION:  PT End of Session - 07/15/24 0843     Visit Number 2    Number of Visits 4    Date for PT Re-Evaluation 08/12/24    Authorization Type [employer] HMO/PPO    Authorization Time Period $30 co-pay    PT Start Time 0845    PT Stop Time 0925    PT Time Calculation (min) 40 min    Activity Tolerance Patient tolerated treatment well;Patient limited by pain    Behavior During Therapy [facility] for tasks assessed/performed           Past Medical History:  Diagnosis Date   Arthritis    Dry skin    on chest using goldbond lotion on   Hypertension    OA (osteoarthritis)    Sciatic nerve pain right comes and goes   Past Surgical History:  Procedure Laterality Date   cyst removed left shoulder      MASS EXCISION Left 06/11/2020   Procedure: REMOVAL OF LEFT SHOULDER SUBCUTANEOUS MASS;  Surgeon: Sheldon Elspeth, MD;  Location: [facility];  Service: General;  Laterality: Left;  MAC VS GENERAL (ANESTHESIA CHOICE)   RIGHT/LEFT HEART CATH AND CORONARY ANGIOGRAPHY N/A 05/11/2022   Procedure: RIGHT/LEFT HEART CATH AND CORONARY ANGIOGRAPHY;  Surgeon: Cherrie Toribio SAUNDERS, MD;  Location: [facility];  Service: Cardiovascular;  Laterality: N/A;   TOTAL HIP ARTHROPLASTY Left 07/01/2021   Procedure: LEFT TOTAL HIP ARTHROPLASTY ANTERIOR APPROACH;  Surgeon: Vernetta Lonni GRADE, MD;  Location: [facility];  Service: Orthopedics;  Laterality: Left;   TOTAL HIP ARTHROPLASTY Right 10/14/2021   Procedure: RIGHT TOTAL HIP ARTHROPLASTY ANTERIOR APPROACH;  Surgeon: Vernetta Lonni GRADE, MD;  Location: [facility];  Service: Orthopedics;  Laterality: Right;   Patient Active Problem List   Diagnosis Date Noted   Hypokalemia 05/13/2022   Acute clinical systolic heart failure (HCC) 05/10/2022   HTN (hypertension)  05/10/2022   HLD (hyperlipidemia) 05/10/2022   Class 2 obesity 05/10/2022   Status post total replacement of right hip 10/14/2021   Status post left hip replacement 07/01/2021   Mass of soft tissue of left upper extremity 03/30/2020    PCP: Anice Wilbert [name], NP  REFERRING PROVIDER: Vernetta Lonni GRADE*  REFERRING DIAG:  D16.758I (ICD-10-CM) - Other tear of medial meniscus of right knee, unspecified whether old or current tear, subsequent encounter  Z98.890 (ICD-10-CM) - S/P right knee arthroscopy   THERAPY DIAG:  Chronic pain of right knee  Muscle weakness (generalized)  Other symptoms and signs involving the musculoskeletal system  Rationale for Evaluation and Treatment: Rehabilitation  ONSET DATE:   SUBJECTIVE:   SUBJECTIVE STATEMENT:  Patient reports overall doing well. He has been going to the gym 3x a week and has done well with his routine. No increased pain with his exercise routine.   PERTINENT HISTORY: Right THA ant approach 10/22, left THA ant approach 7/22, OA, HTN, sciatic nerve pain  DIAGNOSTIC FINDINGS: 05/17/24 MRI compartmental osteoarthrosis is noted with the medial compartment chondromalacia and reactive edema. There is a moderate-sized reactive joint effusion and a moderate-sized leaking Baker's cyst.   Tear of the posterior horn and body of the medial meniscus with slight medial displacement of body. No displaced meniscal flap is identified.   Mild laxity of the MCL suggesting a chronic sprain. There are intact fibers.  PAIN:  Are you having pain? Yes: NPRS scale: at rest 0/10, initial stand up 4/10 Pain location:  right knee  anterior knee,  left knee started hurting in last week in popliteal area. Pain description:  dull, nagging Aggravating factors:  standing & walking.  Relieving factors:  sit down,   PRECAUTIONS: None  RED FLAGS: None   WEIGHT BEARING RESTRICTIONS: No  FALLS:  Has patient fallen in last 6 months? 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  PLOF: Independent and Independent with community mobility without device  PATIENT GOALS:   to get knee better to walk & stand without pain.  NEXT MD VISIT:  tbd  OBJECTIVE:   Patient-Specific Activity Scoring Scheme  0 represents "unable to perform." 10 represents "able to perform at prior level. 0 1 2 3 4 5 6 7 8 9  10 (Date and Score)   Activity Eval     1.  Standing up without pain 3     2.  Standing for 30-60 minutes without pain 3     3.  Walking without pain without pain 3   4.    5.    Score 3    Total score = sum of the activity scores/number of activities Minimum detectable change (90%CI) for average score = 2 points Minimum detectable change (90%CI) for single activity score = 3 points  COGNITION: Overall cognitive status: WFL   EDEMA:  Figure 8:   LLE: 78 cm  RLE: 82 cm  MUSCLE LENGTH: Hamstrings measured with hip flexed to 90*then extending the knee until tension: Right -59* deg;   POSTURE: weight shift left  PALPATION: Tenderness to palpation at patella tendon, quad tendon and anterior joint line.   LOWER EXTREMITY ROM:   ROM Right eval Left eval  Hip flexion    Hip extension    Hip abduction    Hip adduction    Hip internal rotation    Hip external rotation    Knee flexion Supine A: 118*   Knee extension Seated LAQ -3*   Ankle dorsiflexion    Ankle plantarflexion    Ankle inversion    Ankle eversion     (Blank rows = not tested)  LOWER EXTREMITY MMT:  MMT Right eval Left eval  Hip flexion    Hip extension    Hip abduction    Hip adduction    Hip internal rotation    Hip external rotation    Knee flexion 4/5   Knee extension 4/5   Ankle dorsiflexion     Ankle plantarflexion    Ankle inversion    Ankle eversion     (Blank rows = not tested)  FUNCTIONAL TESTS:  18 inch chair transfer: without UE assist 5 x STS 19.43 sec 0/10 before & 3/10 after  GAIT: Distance walked: >200' Assistive device utilized: None Level of assistance: Modified independence Comments: Antalgic pattern with decreased stance right lower extremity and right knee flexed in stance  TODAY'S TREATMENT  DATE: 07/15/2024: Therapeutic Exercise: Recumbent bike, level 5, seat 5, for 8 minutes.  PT educated in use of bike at gym and patient verbalized understanding. BL leg press, 125#, 15x.  PT discusses exercises at the gym patient verbalized understanding. SL leg press, 62#, R LE 15x, 10x. L LE 15x PT discusses exercises at the gym patient verbalized understanding. Gastroc stretch 3x30 seconds into calf raises, 2x10 Quad stretch with towel, cued to keep bend in R knee to avoid pain, 2x30 seconds each side Hamstring stretch on step, 2x30 seconds on each side PT updated HEP to include above stretches.  PT educated patient in need for well-rounded HEP to include flexibility, strength, muscle endurance and balance.  Patient verbalized understanding. PT verbally educated and gave handout to patient about 4 areas of fitness and the importance of doing exercises in all areas.  Talked about ways to include those in his regular exercise routine during the week.  Patient verbalized understanding. Leg extension machine, BL concentric and SL eccentric,5# 2x10 each leg.  Reports minimal pain during exercise. PT discusses exercises at the gym patient verbalized understanding.  Neuro Re-Ed Tandem stance balance: Both legs leading, on floor, eyes closed, eyes open, on foam, eyes closed on foam. 30 seconds each position. Moderate sway on all positions with R LE behind. Minimal sway with L LE behind.    TREATMENT                                                                           DATE: 06/26/2024: Therapeutic Exercise: HEP instruction/performance c cues for techniques, handout provided.  Trial set performed of each for comprehension and symptom assessment.  See below for exercise list. PT educated patient verbally on a well-rounded exercise program should include flexibility of muscles, strengthening, balance activities and muscle endurance activities.  Walking without performing other types of exercise could be a factor in some pain.  Patient verbalized understanding.  PATIENT EDUCATION:  Education details: HEP, POC Person educated: Patient Education method: Programmer, multimedia, Demonstration, Verbal cues, and Handouts Education comprehension: verbalized understanding, returned demonstration, and verbal cues required  HOME EXERCISE PROGRAM:  Access Code: [email or web address] URL: [email or web address] Date: 07/15/2024 Prepared by: Grayce Spatz   Exercises - Supine Active Straight Leg Raise  - 1 x daily - 7 x weekly - 2 sets - 10 reps - 5 seconds hold - Supine Bridge  - 1 x daily - 7 x weekly - 2 sets - 10 reps - 5 seconds hold - Supine Hamstring Stretch with Strap  - 1-2 x daily - 7 x weekly - 1 sets - 3 reps - 20-30 seconds hold - Seated Straight Leg Raise one leg only  - 1 x daily - 7 x weekly - 2 sets - 10 reps - 5 seconds hold - squat at sink with chair behind you  - 1 x daily - 7 x weekly - 2 sets - 10 reps - 5 seconds hold - Seated Hamstring Stretch with Strap  - 1-2 x daily - 7 x weekly - 1 sets - 3 reps - 20-30 seconds hold - Gastroc Stretch on Step  - 1-2 x daily - 7 x weekly - 1 sets - 3 reps - 20-30 seconds  hold - Standing Hamstring Stretch with Step  - 1-2 x daily - 7 x weekly - 1 sets - 3 reps - 20-30 seconds hold - Standing Heel Raise  - 1 x daily - 7 x weekly - 2-3 sets - 10 reps - 5 seconds hold - Standing Quad Stretch with Towel and Arm Support  - 1-2 x daily - 7 x weekly - 1 sets - 3  reps - 20-30 seconds hold - Tandem Stance  - 1 x daily - 3-5 x weekly - 4 sets - 2 reps - 30 seconds hold  ASSESSMENT: CLINICAL IMPRESSION: Patient tolerated the session well today. The focus of the session today was introducing endurance, stretching, strengthening, and balance exercises to his workout routine. Patient verbalized understanding of how to do this.  OBJECTIVE IMPAIRMENTS: Abnormal gait, decreased activity tolerance, decreased endurance, decreased strength, impaired flexibility, and pain.   ACTIVITY LIMITATIONS: carrying, lifting, bending, standing, squatting, stairs, transfers, and locomotion level  PARTICIPATION LIMITATIONS: meal prep, cleaning, community activity, occupation, and fitness activities  PERSONAL FACTORS: Time since onset of injury/illness/exacerbation and 3+ comorbidities: see PMH are also affecting patient's functional outcome.   REHAB POTENTIAL: Good  CLINICAL DECISION MAKING: Stable/uncomplicated  EVALUATION COMPLEXITY: Low   GOALS: Goals reviewed with patient? Yes  LONG TERM GOALS: (target dates for all long term goals  08/12/2024 )   1. Patient will demonstrate/report pain at worst less than or equal to 2/10 to facilitate minimal limitation in daily activity secondary to pain symptoms. Baseline: See objective data Goal status: ongoing, 07/15/2024   2. Patient will demonstrate independent use of home exercise program to facilitate ability to maintain/progress functional gains from skilled physical therapy services. Baseline: See objective data Goal status: ongoing, 07/15/2024  3.  Patient reports Patient-Specific Activity Score improved the average >/= 6 to indicate improvement in functional activities.  Baseline: SEE OBJECTIVE DATA Goal status: ongoing, 07/15/2024   4.  Patient will demonstrate right LE MMT 5/5 throughout to faciltiate usual transfers, stairs, squatting at [facility] for daily life.  Baseline: See objective data Goal status: ongoing,  07/15/2024   PLAN:  PT FREQUENCY:  1 x/week every 2 weeks 4 visits  PT DURATION: 6 weeks  PLANNED INTERVENTIONS: 97164- PT Re-evaluation, 97110-Therapeutic exercises, 97530- Therapeutic activity, 97112- Neuromuscular re-education, 97535- Self Care, 02859- Manual therapy, [phone]- Gait training, [phone]- Electrical stimulation (unattended), [phone]- Electrical stimulation (manual), [id number]- Vasopneumatic device, [id number]- Ionotophoresis 4mg /ml Dexamethasone , 79439 (1-2 muscles), 20561 (3+ muscles)- Dry Needling, Patient/Family education, Balance training, Stair training, Taping, and Joint mobilization  PLAN FOR NEXT SESSION:  Check in w/ updated HEP.  Create and progress strengthening and balance exercises he can incorporate to his regular gym routine.   Ismael Nap, Student-PT, DPT 07/15/2024, 3:12 PM  This entire session of physical therapy was performed under the direct supervision of PT signing evaluation /treatment. PT reviewed note and agrees.   Grayce Spatz, PT, DPT 07/15/2024, 4:17 PM

## 2024-07-15 NOTE — Patient Instructions (Signed)
 Fitness Plan has 4 components.  1. Endurance - Goal is 20-30 minutes. You can break time up between machines. You can do sets with rest between sets if need. Example 5 minutes work, 2 minutes rest for 3 sets. Recommend machines that are sitting with back support that uses both your arms and your legs at the same time. Or can do walking program & can use assistive device if increases time & quality of your walking. Bike is good for non-weight Treadmill safety- step on & off machine with foot on solid portion not treadmill belt. Use safety lead so belt will stop if you get in trouble. Straddle belt. Turn treadmill on & set to desired speed while you are still straddling the belt. Step on & off belt leading with your good leg. Step off the belt first to adjust speed or stop.   2. Strength - Goal is form & control. Weight machines should have enough weight to have resistance but not so much that you have to strain or cheat. If you have to cheat (poor form), strain or the weight stack hits hard / out of control, then you probably have too much weight.   Goal is to do 15 repetitions for 1-3 sets. Start with 1 set and build up. Rest 30-60 seconds between sets.  Do 2-4 leg machines, 2-4 arm machines & 2-4 trunk machines. Build up to 4-6 leg machines & 4-6 arm machines. Look for pictures to make sure you exercise both sides of arm, leg or trunk. You want to balance out muscle groups that you working.   Leg machines like leg press (can do both legs or each leg by themselves),   At home can do theraband exercises for arms or legs, floor transfers, sit to stand to sit using arms as little as possible, Yoga positions 3.Flexibility - make sure you include arms, legs & trunk. Can do Yoga also 4. Balance- can work in corner with chair in front - head turns, arm motions, eyes closed; place one foot inside cabinet or on 4-6 block to work on one-legged stance,   Try to get to each component 3-5 times per week.  Going to  [facility] or fitness center you want do things you can not do at home.  For example use bikes at [facility] if you don't have one at home. Then on days you don't go to [facility], do exercises at home. Having 2 or more programs like one program for [facility] & one program for home / days that you do not go to [facility].   Recommendations are at least 150 minutes a week of moderate intensity exercise. Try not to go more than 2 days in a row without being active for at least 30 minutes a day. Any activity where you are up and moving is good- Walking, bicycling, stationary bicycling, dancing.  (moderate intensity means to get  a little out of breath). To start you may not tolerate moderate but if it is challenging to you then it is helping.    Do resistance exercise at least 2 times a week.  This can be yoga poses or strength training where you lift your own weight (think leg lifts or toe raises)  or light weights like cans of beans with your arms.    Flexibility and balance exercise- safe stretching and practicing balance is very important to health.

## 2024-07-17 ENCOUNTER — Encounter: Admitting: Orthopaedic Surgery

## 2024-07-25 ENCOUNTER — Telehealth (HOSPITAL_COMMUNITY): Payer: Self-pay

## 2024-07-25 ENCOUNTER — Telehealth: Payer: Self-pay | Admitting: Pharmacist

## 2024-07-25 MED ORDER — ZEPBOUND 12.5 MG/0.5ML ~~LOC~~ SOAJ: 12.5000 mg | SUBCUTANEOUS | 0 refills | Status: DC

## 2024-07-25 MED ORDER — ZEPBOUND 15 MG/0.5ML ~~LOC~~ SOAJ: 15.0000 mg | SUBCUTANEOUS | 6 refills | Status: AC

## 2024-07-25 NOTE — Telephone Encounter (Signed)
 Zepbound  dose titration - tolerates current dose well which is 10 mg once a week. Follows healthy diet and exercise regularly weight loss so far is ~8% ( 260 lbs now 239 lbs) . Prescription for 12.5 mg and 15 mg sent to preferred pharmacy

## 2024-07-25 NOTE — Telephone Encounter (Signed)
 Called to confirm/remind patient of their appointment at the [facility] on 07/28/24.   Appointment:   [] Confirmed  [x] Left mess   [] No answer/No voice mail  [] VM Full/unable to leave message  [] Phone not in service  And to bring in all medications and/or complete list.

## 2024-07-28 ENCOUNTER — Ambulatory Visit (HOSPITAL_COMMUNITY)
Admission: RE | Admit: 2024-07-28 | Discharge: 2024-07-28 | Disposition: A | Discharge status: Home / Self Care | Payer: Medicare HMO | Source: Ambulatory Visit | Attending: Cardiology | Admitting: Cardiology

## 2024-07-28 ENCOUNTER — Encounter (HOSPITAL_COMMUNITY): Payer: Self-pay

## 2024-07-28 ENCOUNTER — Ambulatory Visit (HOSPITAL_COMMUNITY): Payer: Self-pay | Admitting: Cardiology

## 2024-07-28 VITALS — BP 118/72 | HR 103 | Ht 68.0 in | Wt 241.4 lb

## 2024-07-28 DIAGNOSIS — I110 Hypertensive heart disease with heart failure: Secondary | ICD-10-CM | POA: Insufficient documentation

## 2024-07-28 DIAGNOSIS — E785 Hyperlipidemia, unspecified: Secondary | ICD-10-CM | POA: Diagnosis not present

## 2024-07-28 DIAGNOSIS — Z7984 Long term (current) use of oral hypoglycemic drugs: Secondary | ICD-10-CM | POA: Diagnosis not present

## 2024-07-28 DIAGNOSIS — I5020 Unspecified systolic (congestive) heart failure: Secondary | ICD-10-CM | POA: Diagnosis not present

## 2024-07-28 DIAGNOSIS — I5022 Chronic systolic (congestive) heart failure: Secondary | ICD-10-CM | POA: Diagnosis not present

## 2024-07-28 DIAGNOSIS — I428 Other cardiomyopathies: Secondary | ICD-10-CM | POA: Insufficient documentation

## 2024-07-28 DIAGNOSIS — G4733 Obstructive sleep apnea (adult) (pediatric): Secondary | ICD-10-CM | POA: Diagnosis not present

## 2024-07-28 DIAGNOSIS — F17210 Nicotine dependence, cigarettes, uncomplicated: Secondary | ICD-10-CM | POA: Insufficient documentation

## 2024-07-28 DIAGNOSIS — I493 Ventricular premature depolarization: Secondary | ICD-10-CM | POA: Diagnosis not present

## 2024-07-28 DIAGNOSIS — Z79899 Other long term (current) drug therapy: Secondary | ICD-10-CM | POA: Diagnosis not present

## 2024-07-28 DIAGNOSIS — I10 Essential (primary) hypertension: Secondary | ICD-10-CM

## 2024-07-28 DIAGNOSIS — Z6836 Body mass index (BMI) 36.0-36.9, adult: Secondary | ICD-10-CM | POA: Insufficient documentation

## 2024-07-28 DIAGNOSIS — I2510 Atherosclerotic heart disease of native coronary artery without angina pectoris: Secondary | ICD-10-CM | POA: Diagnosis not present

## 2024-07-28 DIAGNOSIS — E66812 Obesity, class 2: Secondary | ICD-10-CM | POA: Diagnosis not present

## 2024-07-28 LAB — COMPREHENSIVE METABOLIC PANEL WITH GFR
ALT: 27 U/L (ref 0–44)
AST: 19 U/L (ref 15–41)
Albumin: 3.7 g/dL (ref 3.5–5.0)
Alkaline Phosphatase: 91 U/L (ref 38–126)
Anion gap: 8 (ref 5–15)
BUN: 21 mg/dL — ABNORMAL HIGH (ref 6–20)
CO2: 24 mmol/L (ref 22–32)
Calcium: 9.4 mg/dL (ref 8.9–10.3)
Chloride: 106 mmol/L (ref 98–111)
Creatinine, Ser: 1.47 mg/dL — ABNORMAL HIGH (ref 0.61–1.24)
GFR, Estimated: 56 mL/min — ABNORMAL LOW (ref >60)
Glucose, Bld: 100 mg/dL — ABNORMAL HIGH (ref 70–99)
Potassium: 5 mmol/L (ref 3.5–5.1)
Sodium: 138 mmol/L (ref 135–145)
Total Bilirubin: 0.7 mg/dL (ref 0.0–1.2)
Total Protein: 7 g/dL (ref 6.5–8.1)

## 2024-07-28 LAB — TSH: TSH: <0.01 u[IU]/mL — ABNORMAL LOW (ref 0.350–4.500)

## 2024-07-28 LAB — T4, FREE: Free T4: 3.14 ng/dL — ABNORMAL HIGH (ref 0.61–1.12)

## 2024-07-28 NOTE — Patient Instructions (Addendum)
 Thank you for coming in today  If you had labs drawn today, any labs that are abnormal the clinic will call you No news is good news  Medications: No changes   Follow up appointments:  Your physician recommends that you schedule a follow-up appointment in:  6 months With Dr. Cherrie Please call our office to schedule the follow-up appointment in November 2025 for January 2026   Do the following things EVERYDAY: Weigh yourself in the morning before breakfast. Write it down and keep it in a log. Take your medicines as prescribed Eat low salt foods--Limit salt (sodium) to 2000 mg per day.  Stay as active as you can everyday Limit all fluids for the day to less than 2 liters   At the [facility], you and your health needs are our priority. As part of our continuing mission to provide you with exceptional heart care, we have created designated Provider Care Teams. These Care Teams include your primary Cardiologist (physician) and Advanced Practice Providers (APPs- Physician Assistants and Nurse Practitioners) who all work together to provide you with the care you need, when you need it.   You may see any of the following providers on your designated Care Team at your next follow up: Dr Toribio Cherrie Dr Ezra Shuck Dr. Ria Gardenia Greig Lenetta, NP Caffie Shed, [place] [facility] [place], [place] Beckey Coe, NP Tinnie Redman, PharmD   Please be sure to bring in all your medications bottles to every appointment.    Thank you for choosing [facility]  If you have any questions or concerns before your next appointment please send us  a message through [place] or call our office at [phone].    TO LEAVE A MESSAGE FOR THE NURSE SELECT OPTION 2, PLEASE LEAVE A MESSAGE INCLUDING: YOUR NAME DATE OF BIRTH CALL BACK NUMBER REASON FOR CALL**this is important as we prioritize the call backs  YOU WILL RECEIVE A  CALL BACK THE SAME DAY AS LONG AS YOU CALL BEFORE 4:00 PM

## 2024-07-28 NOTE — Progress Notes (Signed)
 ADVANCED HF CLINIC NOTE   Primary Care: Anice Wilbert BIRCH, NP HF Cardiologist: Dr. Cherrie  HPI: Alan Coffey is a 58 y.o. with HTN, HL, and systolic heart failure due to NICM.  Admitted 5/23 with new acute CHF. Echo EF 25-30%, RV normal, Grade II DD. R/LHC showed with mild nonobstructive CAD, low filling pressures with moderate to severely reduced CO. cMRI: LVEF 21%, RVEF 35%, mid-myocardial LGE basal septum and mid inferior wall. Felt CM possible prior viral myocarditis vs cardiac sarcoidosis.GDMT started, mexiletine added for PVC suppression. He was discharged home, weight 244.6 lbs.  PET scan 1/24 at [facility] EF 42% no sarcoid   Has been on mexilitene for PVC suppression Zio 7/24 -> 39.6% PVcs. Was seen by Dr. Cindie 07/30/23 switched to amio. Follow up Zio 12/24 PVC burden 18.4% (2 morphologies 18.3% and 0.1% respectively).  Repeat echo 2/25 with EF 45-50% and G1DD.  Saw Charlies Arthur in 12/24 in EP Clinic. Zio repeated. PVC improved. Continue amiodarone . Has follow up with EP Clinic soon.   He returns today for heart failure follow up. Overall feeling good. NYHA I. Denies chest pain, dyspnea, fatigue, palpitations, and dizziness. Able to perform ADLs. Appetite decreased with Zebound. Weight at home has been going down. Compliant with all medications.  Cardiac Studies: - Echo (2/25): EF 45-50%, G1DD, RV nl - PET scan (1/24) at [facility] EF 42% no sarcoid - cMRI (5/23):  LVEF 21%, RVEF 35%, mid-myocardial LGE basal septum and mid inferior wall.  - R/LHC (5/23): minimal CAD. RA 2, PA 620/12 (15), PCW 5, CO/CI (fick) 4.1/1.9, PVR 2.4 - Echo (5/23): EF 25-30%, RV okay.  ROS: All systems reviewed and negative except as per HPI.   Past Medical History:  Diagnosis Date   Arthritis    Dry skin    on chest using goldbond lotion on   Hypertension    OA (osteoarthritis)    Sciatic nerve pain right comes and goes   Current Outpatient Medications  Medication Sig Dispense Refill    acetaminophen  (TYLENOL ) 325 MG tablet Take 325 mg by mouth every 6 (six) hours as needed for moderate pain or headache.     amiodarone  (PACERONE ) 200 MG tablet Take 1 tablet (200 mg total) by mouth daily. 90 tablet 3   amLODipine  (NORVASC ) 5 MG tablet TAKE 1 TABLET (5 MG TOTAL) BY MOUTH DAILY. 90 tablet 3   atorvastatin  (LIPITOR) 40 MG tablet TAKE 1 TABLET DAILY 90 tablet 3   carvedilol  (COREG ) 12.5 MG tablet TAKE 1 TABLET (12.5MG  TOTAL) BY MOUTH TWICE A DAY WITH MEALS 60 tablet 11   diphenhydrAMINE  (BENADRYL ) 25 MG tablet Take 25 mg by mouth every 6 (six) hours as needed for allergies.     eplerenone  (INSPRA ) 25 MG tablet TAKE 1 TABLET (25 MG TOTAL) BY MOUTH DAILY. 30 tablet 11   FARXIGA  10 MG TABS tablet Take 1 tablet (10 mg total) by mouth daily. 90 tablet 3   furosemide  (LASIX ) 20 MG tablet TAKE 1 TABLET EVERY DAY AS NEEDED FOR EDEMA OR FLUID 30 tablet 2   Hypromellose (ALZAIR ALLERGY  NASAL SPRAY NA) Place 1 spray into both nostrils at bedtime.     sacubitril -valsartan  (ENTRESTO ) 97-103 MG Take 1 tablet by mouth 2 (two) times daily. 180 tablet 3   sildenafil  (VIAGRA ) 100 MG tablet Take 1 tablet (100 mg total) by mouth daily as needed. TAKE 1/2 TABLET (50MG ) TO 1 WHOLE TABLET (100MG ) DAILY AS NEEDED 20 tablet 3   tirzepatide  (ZEPBOUND ) 12.5  MG/0.5ML Pen Inject 12.5 mg into the skin once a week. 2 mL 0   tirzepatide  (ZEPBOUND ) 15 MG/0.5ML Pen Inject 15 mg into the skin once a week. 2 mL 6   No current facility-administered medications for this encounter.   Allergies  Allergen Reactions   Spironolactone  Other (See Comments)    gynecomastia    Social History   Socioeconomic History   Marital status: Divorced    Spouse name: Not on file   Number of children: Not on file   Years of education: Not on file   Highest education level: Not on file  Occupational History   Not on file  Tobacco Use   Smoking status: Every Day    Current packs/day: 0.50    Average packs/day: 0.5 packs/day  for 37.0 years (18.5 ttl pk-yrs)    Types: Cigarettes   Smokeless tobacco: Never  Vaping Use   Vaping status: Never Used  Substance and Sexual Activity   Alcohol use: Never   Drug use: Never   Sexual activity: Not on file  Other Topics Concern   Not on file  Social History Narrative   Not on file   Social Drivers of Health   Financial Resource Strain: Medium Risk (05/31/2022)   Overall Financial Resource Strain (CARDIA)    Difficulty of Paying Living Expenses: Somewhat hard  Food Insecurity: No Food Insecurity (05/31/2022)   Hunger Vital Sign    Worried About Running Out of Food in the Last Year: Never true    Ran Out of Food in the Last Year: Never true  Transportation Needs: Not on file  Physical Activity: Not on file  Stress: Not on file  Social Connections: Not on file  Intimate Partner Violence: Not on file   Family History  Problem Relation Age of Onset   Heart failure Father    Heart attack Brother    BP 118/72   Pulse (!) 13   Ht 5' 8 (1.727 m)   Wt 109.5 kg (241 lb 6.4 oz)   SpO2 97%   BMI 36.70 kg/m   Wt Readings from Last 3 Encounters:  07/28/24 109.5 kg (241 lb 6.4 oz)  04/09/24 119.7 kg (264 lb)  03/20/24 113.4 kg (250 lb)   PHYSICAL EXAM: General: Well appearing. Walked into clinic Cardiac: JVP flat. S1 and S2 present. Extremities: Warm and dry.  No peripheral edema.  Neuro: Alert and oriented x3. Affect pleasant.  ASSESSMENT & PLAN:  Chronic Systolic Heart Failure: - Echo (5/23): EF 25-30%, RV okay. Nonischemic cardiomyopathy.  - R/LHC (5/23): with mild nonobstructive CAD, low filling pressures with moderate to severely reduced CO.  - Cardiac MRI (5/23): LVEF 21%, RVEF 35%, mid-myocardial LGE basal septum and mid inferior wall, ECV elevated mid inferoseptal (43%) and severely elevated basal inferoseptal (63%) possible sarcoidosis - Echo (9/23): EF 40-45%, RV okay - PET scan (1/24) at [facility] EF 42% no sarcoid. /. If MRI related to myocarditis  -  Echo (2/25): EF 45-50%, G1DD, RV nl - Stable NYHA I. Euvolemic on exam - Continue Entresto  to 97/103 mg bid.  - Continue Lasix  20 mg daily PRN, has not needed - Continue eplerenone  25 daily (had gynecomastia with spiro)  - Continue Farxiga  10 mg daily.  - Continue carvedilol  12.5 mg BID   2. HTN  - well controlled - Continue norvasc  5 mg daily  3. Tobacco Abuse - No longer smoking    5. PVCs/NSVT  - Multifocal PVCs.  - Has been on  mexilitene for PVC suppression Zio 7/24 -> 39.6% PVcs. Was seen by Dr. Cindie 07/30/23 switched to amio  - Still with frequent PVCs despite amio. Monitor 12/24 18.4% PVCs - Continue amio 200 mg daily. Check CMET, TSH. Gets regular eye exams. Follows with EP.    6. Non-obstructive CAD - Very mild.  - No s/s angina - continue atorvastatin  40 mg daily  7. OSA - Severe, AHI 44/hr (6/23). - Has CPAP, does not wear it often. Does not tolerate well.  8. Morbid obesity  -Body mass index is 36.7 kg/m. - On zepbound . Has lost ~25lbs since April. Congratulated.  Follow up in 6 month with Dr. Cherrie  Alan Clarence Dunsmore, NP  8:40 AM

## 2024-07-28 NOTE — Addendum Note (Signed)
 Encounter addended by: Gerome Annabella CROME, RN on: 07/28/2024 11:40 AM  Actions taken: Order list changed, Diagnosis association updated

## 2024-07-29 ENCOUNTER — Ambulatory Visit: Admitting: Physical Therapy

## 2024-07-29 ENCOUNTER — Encounter: Payer: Self-pay | Admitting: Physical Therapy

## 2024-07-29 DIAGNOSIS — M6281 Muscle weakness (generalized): Secondary | ICD-10-CM | POA: Diagnosis not present

## 2024-07-29 DIAGNOSIS — M25561 Pain in right knee: Secondary | ICD-10-CM

## 2024-07-29 DIAGNOSIS — R29898 Other symptoms and signs involving the musculoskeletal system: Secondary | ICD-10-CM | POA: Diagnosis not present

## 2024-07-29 DIAGNOSIS — G8929 Other chronic pain: Secondary | ICD-10-CM

## 2024-07-29 DIAGNOSIS — R600 Localized edema: Secondary | ICD-10-CM

## 2024-07-29 NOTE — Therapy (Addendum)
 OUTPATIENT PHYSICAL THERAPY LOWER EXTREMITY TREATMENT   Patient Name: Alan Coffey MRN: [id number] DOB:January 08, 1966, 58 y.o., male Today's Date: 07/29/2024  PHYSICAL THERAPY DISCHARGE SUMMARY  Visits from Start of Care: 3  Current functional level related to goals / functional outcomes: See below   Remaining deficits: See below   Education / Equipment: Patient was educated in HEP which he appears to understand.    Patient agrees to discharge. Patient goals were partially met. Patient is being discharged due to the patient's request.   Grayce Spatz, PT, DPT 08/12/2024, 3:06 PM   END OF SESSION:  PT End of Session - 07/29/24 0842     Visit Number 3    Number of Visits 4    Date for PT Re-Evaluation 08/12/24    Authorization Type [employer] HMO/PPO    Authorization Time Period $30 co-pay    PT Start Time 0842    PT Stop Time 0931    PT Time Calculation (min) 49 min    Activity Tolerance Patient tolerated treatment well;Patient limited by pain    Behavior During Therapy [facility] for tasks assessed/performed            Past Medical History:  Diagnosis Date   Arthritis    Dry skin    on chest using goldbond lotion on   Hypertension    OA (osteoarthritis)    Sciatic nerve pain right comes and goes   Past Surgical History:  Procedure Laterality Date   cyst removed left shoulder      MASS EXCISION Left 06/11/2020   Procedure: REMOVAL OF LEFT SHOULDER SUBCUTANEOUS MASS;  Surgeon: Sheldon Elspeth, MD;  Location: [facility];  Service: General;  Laterality: Left;  MAC VS GENERAL (ANESTHESIA CHOICE)   RIGHT/LEFT HEART CATH AND CORONARY ANGIOGRAPHY N/A 05/11/2022   Procedure: RIGHT/LEFT HEART CATH AND CORONARY ANGIOGRAPHY;  Surgeon: Cherrie Toribio SAUNDERS, MD;  Location: [facility];  Service: Cardiovascular;  Laterality: N/A;   TOTAL HIP ARTHROPLASTY Left 07/01/2021   Procedure: LEFT TOTAL HIP ARTHROPLASTY ANTERIOR APPROACH;  Surgeon: Vernetta Lonni GRADE, MD;  Location: [facility];  Service: Orthopedics;  Laterality: Left;   TOTAL HIP ARTHROPLASTY Right 10/14/2021   Procedure: RIGHT TOTAL HIP ARTHROPLASTY ANTERIOR APPROACH;  Surgeon: Vernetta Lonni GRADE, MD;  Location: [facility];  Service: Orthopedics;  Laterality: Right;   Patient Active Problem List   Diagnosis Date Noted   Hypokalemia 05/13/2022   Acute clinical systolic heart failure (HCC) 05/10/2022   HTN (hypertension) 05/10/2022   HLD (hyperlipidemia) 05/10/2022   Class 2 obesity 05/10/2022   Status post total replacement of right hip 10/14/2021   Status post left hip replacement 07/01/2021   Mass of soft tissue of left upper extremity 03/30/2020    PCP: Anice Wilbert [name], NP  REFERRING PROVIDER: Vernetta Lonni GRADE*  REFERRING DIAG:  D16.758I (ICD-10-CM) - Other tear of medial meniscus of right knee, unspecified whether old or current tear, subsequent encounter  Z98.890 (ICD-10-CM) - S/P right knee arthroscopy   THERAPY DIAG:  Chronic pain of right knee  Muscle weakness (generalized)  Other symptoms and signs involving the musculoskeletal system  Localized edema  Rationale for Evaluation and Treatment: Rehabilitation  ONSET DATE:   SUBJECTIVE:   SUBJECTIVE STATEMENT:   Patient reports doing overall well. Minimal to no pain. Reports going to the gym and focusing on leg press and leg extension machines. No increase pain w/ gym activities. He still feels like he needs surgery to get  knee back to full function.  He has been doing exercises as advised but knee is not stable.    PERTINENT HISTORY: Right THA ant approach 10/22, left THA ant approach 7/22, OA, HTN, sciatic nerve pain  DIAGNOSTIC FINDINGS: 05/17/24 MRI compartmental osteoarthrosis is noted with the medial compartment chondromalacia and reactive edema. There is a moderate-sized reactive joint effusion and a moderate-sized leaking Baker's cyst.   Tear of the posterior horn and body of the medial  meniscus with slight medial displacement of body. No displaced meniscal flap is identified.   Mild laxity of the MCL suggesting a chronic sprain. There are intact fibers.  PAIN: Are you having pain? Yes: NPRS scale: at rest 0/10, initial stand up 2/10 Pain location:  right knee  anterior knee,  left knee started hurting in last week in popliteal area. Pain description:  dull, nagging Aggravating factors:  standing & walking.  Relieving factors:  sit down,   PRECAUTIONS: None  RED FLAGS: None   WEIGHT BEARING RESTRICTIONS: No  FALLS:  Has patient fallen in last 6 months? 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  PLOF: Independent and Independent with community mobility without device  PATIENT GOALS:   to get knee better to walk & stand without pain.  NEXT MD VISIT:  tbd  OBJECTIVE:   Patient-Specific Activity Scoring Scheme  0 represents "unable to perform." 10 represents "able to perform at prior level. 0 1 2 3 4 5 6 7 8 9  10 (Date and Score)   Activity Eval  07/29/2024   1.  Standing up without pain 3   3  2.  Standing for 30-60 minutes without pain 3   3  3.  Walking without pain without pain 3 3  4.    5.    Score 3 3   Total score = sum of the activity scores/number of activities Minimum detectable change (90%CI) for average score = 2 points Minimum detectable change (90%CI) for single activity score = 3 points  COGNITION: Overall cognitive status: WFL   EDEMA:  Figure 8:   LLE: 78 cm  RLE: 82 cm  MUSCLE LENGTH: Hamstrings measured with hip flexed to 90*then extending the knee until tension: Right -59* deg;   POSTURE: weight shift left  PALPATION: Tenderness to palpation at patella tendon, quad tendon  and anterior joint line.   LOWER EXTREMITY ROM:   ROM Right eval Left eval  Hip flexion    Hip extension    Hip abduction    Hip adduction    Hip internal rotation    Hip external rotation    Knee flexion Supine A: 118*   Knee extension Seated LAQ -3*   Ankle dorsiflexion    Ankle plantarflexion    Ankle inversion    Ankle eversion     (Blank rows = not tested)  LOWER EXTREMITY MMT:  MMT Right eval Right 07/29/2024  Hip flexion    Hip extension    Hip abduction    Hip adduction    Hip internal rotation    Hip external rotation    Knee flexion 4/5 5/5  Knee extension 4/5 5/5  Ankle dorsiflexion    Ankle plantarflexion    Ankle inversion  Ankle eversion     (Blank rows = not tested)  FUNCTIONAL TESTS:  18 inch chair transfer: without UE assist 5 x STS 19.43 sec 0/10 before & 3/10 after  GAIT: Distance walked: >200' Assistive device utilized: None Level of assistance: Modified independence Comments: Antalgic pattern with decreased stance right lower extremity and right knee flexed in stance  TODAY'S TREATMENT                                                                          DATE: 07/29/2024:  Therapeutic Exercise: Recumbent bike, level 5, seat 5 for 6 minutes  BL leg press 137#, 2x15 BL concentric into SL eccentric leg press 112#, 2x15 BL concentric in SL eccentric calf raise w/ UE regression: both UE to Rt UE only, 2x10 each leg Stance knee functional control w/ slider (stance knee flexion on outward motion and extension on inward motion): Anterior-lateral, lateral, lateral-posterior direction of movement 2x6 with Rt LE w/ ipsilateral UE support, 1x6 Lt LE w/ ipsilateral UE support  Leg extension machine 10#, BL concentric into SL eccentric, 2x15 each leg Leg curl machine 35#, bilateral, 1x15 Leg curl machine 15#, SL 2x15 each leg  PT verbally educated patient about the muscles that connect to the knee and the importance of equally strengthening  those muscles for knee function. Patient verbalized understanding.   Neuro Re-Ed Star cone balance exercise: 5 rounds each leg. First 2 rounds with tapping on the floor, last 3 rounds with no tap or touch on the floor outward reach.  Star cone balance exercise on foam: 5 rounds each leg. Tapping on floor for every round. Patient was unsteady when balancing on Rt LE, especially for posterior cones     TREATMENT                                                                          DATE: 07/15/2024: Therapeutic Exercise: Recumbent bike, level 5, seat 5, for 8 minutes.  PT educated in use of bike at gym and patient verbalized understanding. BL leg press, 125#, 15x.  PT discusses exercises at the gym patient verbalized understanding. SL leg press, 62#, R LE 15x, 10x. L LE 15x PT discusses exercises at the gym patient verbalized understanding. Gastroc stretch 3x30 seconds into calf raises, 2x10 Quad stretch with towel, cued to keep bend in R knee to avoid pain, 2x30 seconds each side Hamstring stretch on step, 2x30 seconds on each side PT updated HEP to include above stretches.  PT educated patient in need for well-rounded HEP to include flexibility, strength, muscle endurance and balance.  Patient verbalized understanding. PT verbally educated and gave handout to patient about 4 areas of fitness and the importance of doing exercises in all areas.  Talked about ways to include those in his regular exercise routine during the week.  Patient verbalized understanding. Leg extension machine, BL concentric and SL eccentric,5# 2x10 each leg.  Reports minimal pain during exercise.  PT discusses exercises at the gym patient verbalized understanding.  Neuro Re-Ed Tandem stance balance: Both legs leading, on floor, eyes closed, eyes open, on foam, eyes closed on foam. 30 seconds each position. Moderate sway on all positions with R LE behind. Minimal sway with L LE behind.    TREATMENT                                                                           DATE: 06/26/2024: Therapeutic Exercise: HEP instruction/performance c cues for techniques, handout provided.  Trial set performed of each for comprehension and symptom assessment.  See below for exercise list. PT educated patient verbally on a well-rounded exercise program should include flexibility of muscles, strengthening, balance activities and muscle endurance activities.  Walking without performing other types of exercise could be a factor in some pain.  Patient verbalized understanding.  PATIENT EDUCATION:  Education details: HEP, POC Person educated: Patient Education method: Programmer, multimedia, Demonstration, Verbal cues, and Handouts Education comprehension: verbalized understanding, returned demonstration, and verbal cues required  HOME EXERCISE PROGRAM:  Access Code: [email or web address] URL: [email or web address] Date: 07/29/2024 Prepared by: Ismael Theophilus Stallion  Exercises - Supine Active Straight Leg Raise  - 1 x daily - 7 x weekly - 2 sets - 10 reps - 5 seconds hold - Supine Bridge  - 1 x daily - 7 x weekly - 2 sets - 10 reps - 5 seconds hold - Supine Hamstring Stretch with Strap  - 1-2 x daily - 7 x weekly - 1 sets - 3 reps - 20-30 seconds hold - Seated Straight Leg Raise one leg only  - 1 x daily - 7 x weekly - 2 sets - 10 reps - 5 seconds hold - squat at sink with chair behind you  - 1 x daily - 7 x weekly - 2 sets - 10 reps - 5 seconds hold - Seated Hamstring Stretch with Strap  - 1-2 x daily - 7 x weekly - 1 sets - 3 reps - 20-30 seconds hold - Gastroc Stretch on Step  - 1-2 x daily - 7 x weekly - 1 sets - 3 reps - 20-30 seconds hold - Standing Hamstring Stretch with Step  - 1-2 x daily - 7 x weekly - 1 sets - 3 reps - 20-30 seconds hold - Standing Heel Raise  - 1 x daily - 7 x weekly - 2-3 sets - 10 reps - 5 seconds hold - Standing Quad Stretch with Towel and Arm Support  - 1-2 x daily - 7 x weekly - 1 sets - 3 reps  - 20-30 seconds hold - Tandem Stance  - 1 x daily - 3-5 x weekly - 4 sets - 2 reps - 30 seconds hold - Star Taps  - 1 x daily - 7 x weekly - 5 sets - 5 reps - Standing Eccentric Heel Raise  - 1 x daily - 7 x weekly - 2-3 sets - 10 reps  ASSESSMENT: CLINICAL IMPRESSION:  Patient did overall well today especially with new balance exercises included. He was unsteady on foam during SL activities and will benefit from additional SL exercises. Today's focus was introducing exercises he could do at home and/or the gym  and making sure to include exercises that challenge the muscles that connect to the knee. Patient will benefit from additional PT session to update HEP for home and gym use.   OBJECTIVE IMPAIRMENTS: Abnormal gait, decreased activity tolerance, decreased endurance, decreased strength, impaired flexibility, and pain.   ACTIVITY LIMITATIONS: carrying, lifting, bending, standing, squatting, stairs, transfers, and locomotion level  PARTICIPATION LIMITATIONS: meal prep, cleaning, community activity, occupation, and fitness activities  PERSONAL FACTORS: Time since onset of injury/illness/exacerbation and 3+ comorbidities: see PMH are also affecting patient's functional outcome.   REHAB POTENTIAL: Good  CLINICAL DECISION MAKING: Stable/uncomplicated  EVALUATION COMPLEXITY: Low   GOALS: Goals reviewed with patient? Yes  LONG TERM GOALS: (target dates for all long term goals  08/12/2024 )   1. Patient will demonstrate/report pain at worst less than or equal to 2/10 to facilitate minimal limitation in daily activity secondary to pain symptoms. Baseline: See objective data Goal status: MET, 07/29/2024   2. Patient will demonstrate independent use of home exercise program to facilitate ability to maintain/progress functional gains from skilled physical therapy services. Baseline: See objective data Goal status: MET, 07/29/2024  3.  Patient reports Patient-Specific Activity Score improved  the average >/= 6 to indicate improvement in functional activities.  Baseline: SEE OBJECTIVE DATA Goal status: NOT MET, 07/29/2024   4.  Patient will demonstrate right LE MMT 5/5 throughout to faciltiate usual transfers, stairs, squatting at [facility] for daily life.  Baseline: See objective data Goal status: MET, 07/29/2024   PLAN:  PT FREQUENCY:  1 x/week every 2 weeks 4 visits  PT DURATION: 6 weeks  PLANNED INTERVENTIONS: 97164- PT Re-evaluation, 97110-Therapeutic exercises, 97530- Therapeutic activity, 97112- Neuromuscular re-education, 97535- Self Care, [id number]- Manual therapy, [id number]- Gait training, [phone]- Electrical stimulation (unattended), [phone]- Electrical stimulation (manual), [id number]- Vasopneumatic device, [id number]- Ionotophoresis 4mg /ml Dexamethasone , 79439 (1-2 muscles), 20561 (3+ muscles)- Dry Needling, Patient/Family education, Balance training, Stair training, Taping, and Joint mobilization  PLAN FOR NEXT SESSION:  Check in updated HEP and progress what's needed. Discuss discharge.   Ismael Nap, Student-PT, DPT 07/29/2024, 2:25 PM  This entire session of physical therapy was performed under the direct supervision of PT signing evaluation /treatment. PT reviewed note and agrees.   Robin Waldron, PT, DPT 07/29/2024, 3:10 PM

## 2024-08-05 ENCOUNTER — Other Ambulatory Visit: Payer: Self-pay

## 2024-08-05 DIAGNOSIS — E0590 Thyrotoxicosis, unspecified without thyrotoxic crisis or storm: Secondary | ICD-10-CM

## 2024-08-07 ENCOUNTER — Ambulatory Visit
Admission: RE | Admit: 2024-08-07 | Discharge: 2024-08-07 | Disposition: A | Discharge status: Home / Self Care | Source: Ambulatory Visit

## 2024-08-07 DIAGNOSIS — E0590 Thyrotoxicosis, unspecified without thyrotoxic crisis or storm: Secondary | ICD-10-CM

## 2024-08-12 ENCOUNTER — Encounter: Admitting: Physical Therapy

## 2024-08-17 ENCOUNTER — Other Ambulatory Visit (HOSPITAL_COMMUNITY): Payer: Self-pay | Admitting: Internal Medicine

## 2024-08-19 NOTE — Progress Notes (Unsigned)
 Cardiology Office Note:  .   Date:  08/19/2024  ID:  CAL GINDLESPERGER, DOB November 02, 1966, MRN [id number] PCP: Anice Wilbert [name], NP  [facility] Providers Cardiologist:  None Electrophysiologist:  OLE ONEIDA HOLTS, MD {  History of Present Illness: .   Alan Coffey is a 58 y.o. male w/PMHx of HTN, NICM, PVCs  -- Echo EF 25-30%, RV normal, Grade II DD.  -- R/LHC showed with mild nonobstructive CAD, low filling pressures with moderate to severely reduced CO.  -- cMRI: LVEF 21%, RVEF 35%, mid-myocardial LGE basal septum and mid inferior wall. Felt CM possible prior viral myocarditis vs cardiac sarcoidosis  -- PET scan 1/24 at [facility] EF 42% no sarcoid   Saw Dr. HOLTS 07/30/23, discussed hx of PVCs (burden of 39.6%), mexiletine felt to have failed and stopped.  Discussed management options including ablation, pt favoring avoid invasive procedure and started on amiodarone  Advised 4-5 mo follow up and repeat monitoring Baseline labs done  Saw Dr. Bensimhon 08/15/23, initially during higher dose of amiodarone , felt funny, but improving on 200mg  daily, walking 5-5miles most days On GDMT, meds unchanged, planned for labs Still with frequent PVCs despite amiodarone , planned to revisit with Dr. HOLTS for consideration of ablation  I saw him Dec  2024 He feels well Never has been aware of the PVCs No CP, palpitations No SOB No near syncope or syncope Tolerating amiodarone  > planned for labs (were wnl) and a monitor (PVC burden down to 18.4% though recommended that he revisit ablation)  Saw Dr. Cherrie 01/29/24, doing OK, due to see Dr. HOLTS to revisit ablation  Saw Dr. HOLTS 01/30/24, suspect PVCs originating in the LVOT/AMC he denied limitations, no PVCs on EKG or prolonged auscultation. Suspected getting an improved amiodarone  level and affect. Urged importance of weight loss and sleep apnea treatment. Back with EP in 1mo  Saw the HF team 07/28/24, feeling well class I  symptoms, on Zepbound  with reports of home weights declining. Poor CPAP compliance Planned for surveillance labs  TSH  <0.010 Free T4 3.14 LFTs were ok Advised to see his PMD >> and EP   Today's visit is scheduled as a 6 mo visit, and review medication management ROS:   He is doing OK. Saw a provider at his PMD office, they felt likely the amiodarone  > deferred that to us , did repeat labs with plans to see him again in a month. No therapy initiated for the thyroid .  Seems a bit more aware of his heart beat, feels a little excited perhaps. Not racing, thumping, overt palpitations  No CP, SOB No near syncope or syncope  He would like to pursue ablation   Arrhythmia/AAD hx Mexiletine stopped Aug 2024 with failure to suppress PVCs Amiodarone  started Aug 2024  Studies Reviewed: SABRA    EKG done today and reviewed by myself SR 70bpm. + PVCs/trigeminal  06/27/2023 Zio  HR 52 - 160, average 73 bpm. 22 nonsustained VT, longest 7 beats. Rare supraventricular ectopy. Frequent ventricular ectopy, 39.6% No sustained arrhythmias. No atrial fibrillation.   Risk Assessment/Calculations:    Physical Exam:   VS:  There were no vitals taken for this visit.   Wt Readings from Last 3 Encounters:  07/28/24 241 lb 6.4 oz (109.5 kg)  04/09/24 264 lb (119.7 kg)  03/20/24 250 lb (113.4 kg)    GEN: Well nourished, well developed in no acute distress NECK: No JVD; No carotid bruits CARDIAC: RRR, + extrasystoles, no murmurs, rubs, gallops RESPIRATORY:  CTA b/l without rales, wheezing or rhonchi  ABDOMEN: Soft, non-tender, non-distended EXTREMITIES: No edema; No deformity    ASSESSMENT AND PLAN: .    PVCs Amiodarone  Abnormal thyroid  surveillance labs >> following with his PMD Will stop amiodarone  He has discussed ablation in the past with Dr. Cindie and would like to proceed.  I will send note/discuss with Dr. Cindie, the patient is comfortable in his prior discussion about the  procedure, to schedule without re-visiting with Dr. Cindie.  Will discuss perhaps changing his coreg  > Toprol   NICM No symptoms or exam findings of volume OL LVEF by 2024 PET was 42% C/w Dr. Boone   Dispo: back up visit for 6 weeks pending d/w Dr. Cindie, sooner if needed  Signed, Alan Macario Arthur, PA-C

## 2024-08-20 ENCOUNTER — Encounter: Payer: Self-pay | Admitting: Orthopaedic Surgery

## 2024-08-20 ENCOUNTER — Ambulatory Visit: Admitting: Orthopaedic Surgery

## 2024-08-20 DIAGNOSIS — S83231D Complex tear of medial meniscus, current injury, right knee, subsequent encounter: Secondary | ICD-10-CM

## 2024-08-20 DIAGNOSIS — M25561 Pain in right knee: Secondary | ICD-10-CM | POA: Diagnosis not present

## 2024-08-20 DIAGNOSIS — G8929 Other chronic pain: Secondary | ICD-10-CM | POA: Diagnosis not present

## 2024-08-20 NOTE — Progress Notes (Signed)
 The patient returns for follow-up after having gone through physical therapy for his right knee.  He does have a medial meniscal tear and medial compartment arthritic changes.  He says the knee is feeling better overall and he is able to do more.  He likes to do a lot of walking.  He says the pain has gotten better.  He still has popping in the knee but no locking and catching.  On exam the knee does have varus malalignment that is easily correctable.  His knee is ligamentously stable with good range of motion.  There are some patellofemoral crepitation.  There is no instability on exam.  There is no effusion.  Since he is doing well, we will hold off on any other intervention for now.  He may be a good candidate at some point for hyaluronic acid if he does develop pain from the osteoarthritis of the knee.  He will continue quad strengthening exercises and weight loss and if his knee flares up he will come back and see us .  For now, follow-up is as needed.  All questions and concerns were addressed and answered.

## 2024-08-21 ENCOUNTER — Ambulatory Visit: Attending: Cardiology | Admitting: Physician Assistant

## 2024-08-21 VITALS — BP 111/74 | HR 72 | Ht 68.0 in | Wt 242.0 lb

## 2024-08-21 DIAGNOSIS — Z5181 Encounter for therapeutic drug level monitoring: Secondary | ICD-10-CM | POA: Diagnosis not present

## 2024-08-21 DIAGNOSIS — Z79899 Other long term (current) drug therapy: Secondary | ICD-10-CM

## 2024-08-21 DIAGNOSIS — I428 Other cardiomyopathies: Secondary | ICD-10-CM | POA: Diagnosis not present

## 2024-08-21 DIAGNOSIS — I493 Ventricular premature depolarization: Secondary | ICD-10-CM

## 2024-08-21 NOTE — Patient Instructions (Addendum)
 Medications:  STOP TAKING : AMIODARONE    *If you need a refill on your cardiac medications before your next appointment, please call your pharmacy*  Lab Work:   NONE ORDERED  TODAY  If you have labs (blood work) drawn today and your tests are completely normal, you will receive your results only by: MyChart Message (if you have MyChart) OR A paper copy in the mail If you have any lab test that is abnormal or we need to change your treatment, we will call you to review the results.   Testing/Procedures:   NONE ORDERED  TODAY    Follow-Up: At [facility], you and your health needs are our priority.  As part of our continuing mission to provide you with exceptional heart care, our providers are all part of one team.  This team includes your primary Cardiologist (physician) and Advanced Practice Providers or APPs (Physician Assistants and Nurse Practitioners) who all work together to provide you with the care you need, when you need it.   Your next appointment:  6  WEEKS  Provider:  Ole Holts, MD or Charlies Arthur, PA-C  ( CONTACT  CASSIE HALL/ ANGELINE HAMMER FOR EP SCHEDULING ISSUES )   We recommend signing up for the patient portal called MyChart.  Sign up information is provided on this After Visit Summary.  MyChart is used to connect with patients for Virtual Visits (Telemedicine).  Patients are able to view lab/test results, encounter notes, upcoming appointments, etc.  Non-urgent messages can be sent to your provider as well.   To learn more about what you can do with MyChart, go to [email or web address].   Other Instructions

## 2024-09-12 ENCOUNTER — Other Ambulatory Visit (HOSPITAL_COMMUNITY): Payer: Self-pay | Admitting: Internal Medicine

## 2024-09-25 ENCOUNTER — Other Ambulatory Visit (HOSPITAL_COMMUNITY): Payer: Self-pay | Admitting: Internal Medicine

## 2024-10-01 ENCOUNTER — Ambulatory Visit: Admitting: Cardiology

## 2024-10-06 NOTE — Progress Notes (Unsigned)
 Electrophysiology Office Follow up Visit Note:    Date:  10/07/2024   ID:  Alan Alan, DOB Jun 14, 1966, MRN [id number]  PCP:  Alan Wilbert [name], NP  [facility] Cardiologist:  None  [facility] Electrophysiologist:  Alan ONEIDA HOLTS, MD    Interval History:     Alan Alan is a 58 y.o. male who presents for a follow up visit.   I last saw the patient January 30, 2024.  The patient has frequent PVCs and chronic systolic heart failure.  At that appointment we discussed treatment options for his PVCs.  He was still on amiodarone  at the time of the last appointment.  He saw Alan Alan August 21, 2024.  Since he saw Alan Alan, he was started on methimazole by his primary care physician for hyperthyroidism.  Today he tells me he feels well.  No syncope or presyncope.  No swelling or shortness of breath.  His energy level is good.       Past medical, surgical, social and family history were reviewed.  ROS:   Please see the history of present illness.    All other systems reviewed and are negative.  EKGs/Labs/Other Studies Reviewed:    The following studies were reviewed today:  February 13, 2024 echo EF 45-50 RV normal    EKG Interpretation Date/Time:  Tuesday October 07 2024 09:11:12 EDT Ventricular Rate:  80 PR Interval:  190 QRS Duration:  92 QT Interval:  406 QTC Calculation: 468 R Axis:   35  Text Interpretation: Sinus rhythm with occasional and consecutive Premature ventricular complexes Confirmed by Alan Alan [phone]) on 10/07/2024 9:14:09 AM    Physical Exam:    VS:  BP 124/87 (BP Location: Right Arm, Patient Position: Sitting, Cuff Size: Normal)   Pulse 80   Ht 5' 8 (1.727 m)   Wt 229 lb (103.9 kg)   SpO2 94%   BMI 34.82 kg/m     Wt Readings from Last 3 Encounters:  10/07/24 229 lb (103.9 kg)  08/21/24 242 lb (109.8 kg)  07/28/24 241 lb 6.4 oz (109.5 kg)     GEN: no distress CARD: RRR, No MRG RESP: No IWOB. CTAB.       ASSESSMENT:    1. HFrEF (heart failure with reduced ejection fraction) (HCC)   2. NICM (nonischemic cardiomyopathy) (HCC)   3. 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  NYHA class II symptoms. I had a long discussion with the patient this morning about his PVCs and treatment strategies.  We again discussed conservative therapy for PVCs and antiarrhythmics and invasive/EP study ablation.  We discussed the pros and cons of each strategy.  He was just started on methimazole for hyperthyroidism.  He has repeat blood work scheduled for the end of the month with his primary care physician.  I have recommended that we repeat a 72-hour ZIO monitor during the first week of November to reassess his PVC burden on stable methimazole dosing.  If the PVC burden on this monitor is significant (greater than 10 or 15%), favor proceeding with EP study and ablation.  If the PVC burden is low, favor continuing with a conservative approach.  The patient was agreeable to this strategy.  I also discussed my upcoming departure from [facility].  I discussed transitioning his care to Alan Alan.  He will see Alan Alan in December to review the  results of his thyroid  studies and ZIO monitor.    Follow-up with Alan Alan in December.     Signed, Alan Holts, MD, [facility], [facility] 10/07/2024 9:29 AM    Electrophysiology [facility]

## 2024-10-07 ENCOUNTER — Ambulatory Visit (INDEPENDENT_AMBULATORY_CARE_PROVIDER_SITE_OTHER)

## 2024-10-07 ENCOUNTER — Encounter: Payer: Self-pay | Admitting: Cardiology

## 2024-10-07 ENCOUNTER — Ambulatory Visit: Attending: Cardiology | Admitting: Cardiology

## 2024-10-07 VITALS — BP 124/87 | HR 80 | Ht 68.0 in | Wt 229.0 lb

## 2024-10-07 DIAGNOSIS — I428 Other cardiomyopathies: Secondary | ICD-10-CM

## 2024-10-07 DIAGNOSIS — I493 Ventricular premature depolarization: Secondary | ICD-10-CM

## 2024-10-07 DIAGNOSIS — I5020 Unspecified systolic (congestive) heart failure: Secondary | ICD-10-CM

## 2024-10-07 NOTE — Patient Instructions (Signed)
 Medication Instructions:  Your physician recommends that you continue on your current medications as directed. Please refer to the Current Medication list given to you today.  *If you need a refill on your cardiac medications before your next appointment, please call your pharmacy*  Testing/Procedures: Event Monitor - start wearing the week of November 3rd Your physician has recommended that you wear an event monitor. Event monitors are medical devices that record the heart's electrical activity. Doctors most often us  these monitors to diagnose arrhythmias. Arrhythmias are problems with the speed or rhythm of the heartbeat. The monitor is a small, portable device. You can wear one while you do your normal daily activities. This is usually used to diagnose what is causing palpitations/syncope (passing out).  Follow-Up: At [facility], you and your health needs are our priority.  As part of our continuing mission to provide you with exceptional heart care, our providers are all part of one team.  This team includes your primary Cardiologist (physician) and Advanced Practice Providers or APPs (Physician Assistants and Nurse Practitioners) who all work together to provide you with the care you need, when you need it.  Your next appointment:   6-8 weeks  Provider:   Fonda Kitty, MD    Alan Coffey- Long Term Monitor Instructions  Your physician has requested you wear a ZIO patch monitor for 3 days.  This is a single patch monitor. [employer] supplies one patch monitor per enrollment. Additional stickers are not available. Please do not apply patch if you will be having a Nuclear Stress Test,  Echocardiogram, Cardiac CT, MRI, or Chest Xray during the period you would be wearing the  monitor. The patch cannot be worn during these tests. You cannot remove and re-apply the  ZIO XT patch monitor.  Your ZIO patch monitor will be mailed 3 day USPS to your address on file. It may take 3-5 days  to  receive your monitor after you have been enrolled.  Once you have received your monitor, please review the enclosed instructions. Your monitor  has already been registered assigning a specific monitor serial # to you.  Billing and Patient Assistance Program Information  We have supplied [employer] with any of your insurance information on file for billing purposes. [employer] offers a sliding scale Patient Assistance Program for patients that do not have  insurance, or whose insurance does not completely cover the cost of the ZIO monitor.  You must apply for the Patient Assistance Program to qualify for this discounted rate.  To apply, please call [employer] at [phone], select option 4, select option 2, ask to apply for  Patient Assistance Program. Meredeth will ask your household income, and how many people  are in your household. They will quote your out-of-pocket cost based on that information.  [employer] will also be able to set up a 53-month, interest-free payment plan if needed.  Applying the monitor   Shave hair from upper left chest.  Hold abrader disc by orange tab. Rub abrader in 40 strokes over the upper left chest as  indicated in your monitor instructions.  Clean area with 4 enclosed alcohol pads. Let dry.  Apply patch as indicated in monitor instructions. Patch will be placed under collarbone on left  side of chest with arrow pointing upward.  Rub patch adhesive wings for 2 minutes. Remove white label marked 1. Remove the white  label marked 2. Rub patch adhesive wings for 2 additional minutes.  While looking in a mirror, press  and release button in center of patch. A small green light will  flash 3-4 times. This will be your only indicator that the monitor has been turned on.  Do not shower for the first 24 hours. You may shower after the first 24 hours.  Press the button if you feel a symptom. You will hear a small click. Record Date, Time and  Symptom in the Patient  Logbook.  When you are ready to remove the patch, follow instructions on the last 2 pages of Patient  Logbook. Stick patch monitor onto the last page of Patient Logbook.  Place Patient Logbook in the blue and white box. Use locking tab on box and tape box closed  securely. The blue and white box has prepaid postage on it. Please place it in the mailbox as  soon as possible. Your physician should have your test results approximately 7 days after the  monitor has been mailed back to [facility].  Call [facility] Customer Care at [phone] if you have questions regarding  your ZIO XT patch monitor. Call them immediately if you see an orange light blinking on your  monitor.  If your monitor falls off in less than 4 days, contact our Monitor department at [phone].  If your monitor becomes loose or falls off after 4 days call [employer] at [phone] for  suggestions on securing your monitor

## 2024-10-07 NOTE — Progress Notes (Unsigned)
 Enrolled patient for a 3 day Zio XT monitor to be mailed to patients home

## 2024-10-27 ENCOUNTER — Encounter: Payer: Self-pay | Admitting: Radiology

## 2024-11-05 DIAGNOSIS — I5020 Unspecified systolic (congestive) heart failure: Secondary | ICD-10-CM

## 2024-11-05 DIAGNOSIS — I493 Ventricular premature depolarization: Secondary | ICD-10-CM | POA: Diagnosis not present

## 2024-11-05 DIAGNOSIS — I428 Other cardiomyopathies: Secondary | ICD-10-CM | POA: Diagnosis not present

## 2024-11-06 ENCOUNTER — Other Ambulatory Visit (HOSPITAL_COMMUNITY): Payer: Self-pay

## 2024-11-18 NOTE — Progress Notes (Signed)
 " Electrophysiology Office Note:   Date:  11/19/2024  ID:  Alan Coffey, DOB [birth year], MRN [id number]  Primary Cardiologist: None Electrophysiologist: Fonda Kitty, MD      History of Present Illness:   Alan Coffey is a 58 y.o. male with h/o frequent PVCs and chronic systolic heart failure who is being seen today for EP follow up.  Discussed the use of AI scribe software for clinical note transcription with the patient, who gave verbal consent to proceed.  History of Present Illness Alan Coffey is a 58 year old male with PVCs and thyroid  issues who presents for evaluation of persistent PVCs and medication management.  He has been experiencing frequent premature ventricular contractions (PVCs), with a recent monitor showing a PVC burden of over thirty percent. He has been on medication to suppress the PVCs, but this has led to thyroid  issues.  He is currently taking methimazole to manage his thyroid  condition, which started acting up as a result of the medication used to suppress the PVCs. His thyroid  levels were last checked in August, and they have started to improve slightly since starting methimazole. He has a follow-up appointment scheduled for next month to reassess his thyroid  function.  He is concerned about his medication coverage, specifically Entresto , as his insurance will not cover it next year. He has chronic systolic heart failure and has been on this medication. He has tried various insurance options without success and is unable to use manufacturer assistance due to having Medicare.  He is currently taking a beta blocker.   Review of systems complete and found to be negative unless listed in HPI.   EP Information / Studies Reviewed:       EKG 08/21/24: SR with PVCs   Zio 11/05/24: HR 72 to 218, average 86. 310 nonsustained VT episodes, longest 9 beats. Occasional supraventricular ectopy, 3% Frequent ventricular ectopy, 32.7% No sustained  arrhythmias. No atrial fibrillation.  Echo 02/13/24:  1. Left ventricular ejection fraction, by estimation, is 45 to 50%. The  left ventricle has mildly decreased function. The left ventricle  demonstrates global hypokinesis. The left ventricular internal cavity size  was mildly dilated. Left ventricular  diastolic parameters are consistent with Grade I diastolic dysfunction  (impaired relaxation).   2. Right ventricular systolic function is normal. The right ventricular  size is normal. There is normal pulmonary artery systolic pressure. The  estimated right ventricular systolic pressure is 22.9 mmHg.   3. Right atrial size was mildly dilated.   4. The mitral valve is normal in structure. No evidence of mitral valve  regurgitation. No evidence of mitral stenosis.   5. The aortic valve is tricuspid. Aortic valve regurgitation is not  visualized. No aortic stenosis is present.   6. Aortic dilatation noted. There is mild dilatation of the ascending  aorta, measuring 38 mm.   7. The inferior vena cava is normal in size with greater than 50%  respiratory variability, suggesting right atrial pressure of 3 mmHg.   Cardiac MRI 05/12/22: FINDINGS: 1. Moderately increased left ventricular size, with LVEDD 69 mm, but LVEDVi 124 mL/m2. Mild concentric hypertrophy, with intraventricular septal thickness of 8 mm, posterior wall thickness of 7 mm, but myocardial mass index of 89 g/m2. Severe left ventricular systolic dysfunction (LVEF =21%). Paradoxical septal motion and global hypokinesis. No LV thrombus. GLS -4.8% Left ventricular parametric mapping notable for normal T2. ECV is elevated in the mid inferoseptal (43%) and severely elevated in the basal inferoseptal (63%).  There is late gadolinium enhancement in the left ventricular myocardium: There is mid myocardial and patch LGE in the mid inferior, and basal septal (anterior and inferior, predominantly mid myocardial but transmural in the  basal anteroseptal).   2. Normal right ventricular size with RVEDVI 83 mL/m2. Normal right ventricular thickness. Moderately decrease right ventricular systolic function (RVEF =35%). Septal hypokinesis.   3.  Normal left and right atrial size.   4. Normal size of the aortic root, ascending aorta and pulmonary artery.   5. Valve assessment: Aortic Valve: Tri-leaflet aortic valve. Mild aortic regurgitation, regurgitation fraction 3%. Mean gradient 2 mm Hg. Pulmonic Valve: Qualitatively, there is no significant regurgitation. Tricuspid Valve: Qualitatively, there is mild central regurgitation. Mitral Valve: Mild to moderate ventricular functional central mitral regurgitation. Regurgitant volume 12 mL, regurgitant fraction 21%.   6.  Normal pericardium.  No pericardial effusion.   7. Grossly, no extracardiac findings. Recommended dedicated study if concerned for non-cardiac pathology.   8. Patient motion artifacts noted. This decreased the sensitivity of ventricular volumes and valve assessment.      Physical Exam:   VS:  BP 128/84   Pulse 71   Ht 5' 8 (1.727 m)   Wt 231 lb 6.4 oz (105 kg)   SpO2 96%   BMI 35.18 kg/m    Wt Readings from Last 3 Encounters:  11/19/24 231 lb 6.4 oz (105 kg)  10/07/24 229 lb (103.9 kg)  08/21/24 242 lb (109.8 kg)     GEN: Well nourished, well developed in no acute distress NECK: No JVD CARDIAC: Normal rate, irregular rhythm RESPIRATORY:  Clear to auscultation without rales, wheezing or rhonchi  ABDOMEN: Soft, non-distended EXTREMITIES: Trace edema; No deformity   ASSESSMENT AND PLAN:     #Frequent PVCs: RBBB, positive concordance, left inferior axis.  Suspicion is for mitral annulus or anterolateral papillary muscle. #High risk medication use: Off amiodarone  and taking methimazole for hyperthyroidism. #Hyperthyroidism: Continue follow-up with PCP for management. -LVEF 45-50%.  NYHA class II symptoms.  Has tried mexiletine previously  without adequate suppression.  Hyperthyroidism complicated amiodarone  use.  Continues to have high burden and symptoms.  His only other medication option appears to be sotalol, but has had significantly low EF in the past.  We discussed role of catheter ablation in efforts to reduce PVC burden and potentially improve/stabilize LVEF. Risk, benefits, and alternatives to EP study and radiofrequency ablation for PVCs were also discussed in detail today. These risks include but are not limited to complete heart block, stroke, bleeding, vascular damage, tamponade, perforation, and death. The patient understands these risk and wishes to proceed.  We will therefore proceed with catheter ablation at the next available time.  Carto and ICE are requested for the procedure.    #Chronic systolic heart failure: Reasonably well compensated on exam today. #NICM: PET negative for sarcoid. - History of LVEF as low as 20% on prior cardiac MRI.  Has improved to 45 to 50% on most recent echocardiogram.  Improvement while on guideline directed medical therapy, including Entresto .  Unfortunately, patient reports that his insurance company is no longer willing to pay for Entresto , despite medical necessity.  We will try to appeal and asked for assistance with our pharmacy and obtaining medication.  Continue other GDMT and follow-up with our HF team.   Follow up with Dr. Kennyth 3 months after catheter ablation.   Signed, Fonda Kennyth, MD  "

## 2024-11-19 ENCOUNTER — Encounter: Payer: Self-pay | Admitting: Cardiology

## 2024-11-19 ENCOUNTER — Other Ambulatory Visit: Payer: Self-pay | Admitting: *Deleted

## 2024-11-19 ENCOUNTER — Ambulatory Visit: Attending: Cardiology | Admitting: Cardiology

## 2024-11-19 VITALS — BP 128/84 | HR 71 | Ht 68.0 in | Wt 231.4 lb

## 2024-11-19 DIAGNOSIS — I493 Ventricular premature depolarization: Secondary | ICD-10-CM

## 2024-11-19 DIAGNOSIS — Z79899 Other long term (current) drug therapy: Secondary | ICD-10-CM | POA: Diagnosis not present

## 2024-11-19 DIAGNOSIS — I5022 Chronic systolic (congestive) heart failure: Secondary | ICD-10-CM

## 2024-11-19 DIAGNOSIS — E0590 Thyrotoxicosis, unspecified without thyrotoxic crisis or storm: Secondary | ICD-10-CM | POA: Diagnosis not present

## 2024-11-19 DIAGNOSIS — I428 Other cardiomyopathies: Secondary | ICD-10-CM

## 2024-11-19 DIAGNOSIS — Z01812 Encounter for preprocedural laboratory examination: Secondary | ICD-10-CM

## 2024-11-19 NOTE — Patient Instructions (Addendum)
 Medication Instructions:  The current medical regimen is effective;  continue present plan and medications.  *If you need a refill on your cardiac medications before your next appointment, please call your pharmacy*   Testing/Procedures: Your physician has recommended that you have an ablation. Catheter ablation is a medical procedure used to treat some cardiac arrhythmias (irregular heartbeats). During catheter ablation, a long, thin, flexible tube is put into a blood vessel in your groin (upper thigh), or neck. This tube is called an ablation catheter. It is then guided to your heart through the blood vessel. Radio frequency waves destroy small areas of heart tissue where abnormal heartbeats may cause an arrhythmia to start. Please see the instruction sheet given to you today. The office will be in contact with further instructions via MyChart.  Possibly January 16, 2025  Follow-Up: At [facility], you and your health needs are our priority.  As part of our continuing mission to provide you with exceptional heart care, our providers are all part of one team.  This team includes your primary Cardiologist (physician) and Advanced Practice Providers or APPs (Physician Assistants and Nurse Practitioners) who all work together to provide you with the care you need, when you need it.  Your next appointment:   1 month(s) After your ablation  Provider:   You may see Fonda Kitty, MD or one of the following Advanced Practice Providers on your designated Care Team:   Charlies Arthur, PA-C Michael Andy Tillery, PA-C Suzann Riddle, NP Daphne Barrack, NP Artist Pouch, PA-C     We recommend signing up for the patient portal called MyChart.  Sign up information is provided on this After Visit Summary.  MyChart is used to connect with patients for Virtual Visits (Telemedicine).  Patients are able to view lab/test results, encounter notes, upcoming appointments, etc.  Non-urgent messages can be  sent to your provider as well.   To learn more about what you can do with MyChart, go to [email or web address].

## 2024-12-15 ENCOUNTER — Telehealth: Payer: Self-pay

## 2024-12-15 NOTE — Telephone Encounter (Signed)
-----   Message from Nurse Doreatha BROCKS, RN sent at 11/24/2024  8:04 AM EST ----- Regarding: 01/16/25 PVC ablation Patient needs to hold carvedilol  for two days prior ----- Message ----- From: Chauvigne, Carlyle, RN Sent: 11/19/2024  11:20 AM EST To: Pluma Diniz, [name]; [place] MALVA Sever, RN;# Subject: 01/16/25 PVC ablation                            Precert:  MD: Kennyth Type of ablation: PVC Diagnosis: PVC CPT code: VT/PVC (06345) Ablation scheduled (date/time): 01/16/25 at 1130am  Procedure:  Added to calendar? Yes Orders entered? Yes Letter complete? No, >30 days before procedure Scheduled with cath lab? Yes Any medications to hold? Yes (please list hold instructions): Zepbound , farxiga  Labs ordered (CBC, BMET, PT/INR if on warfarin): Yes Mapping system: Doesn't matter CARTO/OPAL rep notified? No Cardiac CT needed? No Dye allergy ? No Pre-meds ordered and instructions given? No, not needed Letter method: MyChart H&P: 11/26 Device: No  Follow-up:  Cassie/Angel, please schedule Routine.  Covering RN - please send this message to [employer], EP scheduler, EP Scheduling pool, EP [employer], and CT scheduler (Brittany Lynch/Stephanie Mogg), if indicated.

## 2024-12-29 ENCOUNTER — Ambulatory Visit (HOSPITAL_COMMUNITY)
Admission: RE | Admit: 2024-12-29 | Discharge: 2024-12-29 | Disposition: A | Discharge status: Home / Self Care | Source: Ambulatory Visit | Attending: Internal Medicine | Admitting: Internal Medicine

## 2024-12-29 ENCOUNTER — Encounter (HOSPITAL_COMMUNITY): Payer: Self-pay | Admitting: Internal Medicine

## 2024-12-29 VITALS — BP 122/70 | HR 80 | Wt 234.6 lb

## 2024-12-29 DIAGNOSIS — Z6835 Body mass index (BMI) 35.0-35.9, adult: Secondary | ICD-10-CM | POA: Diagnosis not present

## 2024-12-29 DIAGNOSIS — I428 Other cardiomyopathies: Secondary | ICD-10-CM | POA: Insufficient documentation

## 2024-12-29 DIAGNOSIS — Z79899 Other long term (current) drug therapy: Secondary | ICD-10-CM | POA: Diagnosis not present

## 2024-12-29 DIAGNOSIS — I2510 Atherosclerotic heart disease of native coronary artery without angina pectoris: Secondary | ICD-10-CM | POA: Insufficient documentation

## 2024-12-29 DIAGNOSIS — Z7985 Long-term (current) use of injectable non-insulin antidiabetic drugs: Secondary | ICD-10-CM | POA: Insufficient documentation

## 2024-12-29 DIAGNOSIS — Z87891 Personal history of nicotine dependence: Secondary | ICD-10-CM | POA: Insufficient documentation

## 2024-12-29 DIAGNOSIS — I110 Hypertensive heart disease with heart failure: Secondary | ICD-10-CM | POA: Insufficient documentation

## 2024-12-29 DIAGNOSIS — G4733 Obstructive sleep apnea (adult) (pediatric): Secondary | ICD-10-CM | POA: Diagnosis not present

## 2024-12-29 DIAGNOSIS — I10 Essential (primary) hypertension: Secondary | ICD-10-CM

## 2024-12-29 DIAGNOSIS — I493 Ventricular premature depolarization: Secondary | ICD-10-CM | POA: Diagnosis not present

## 2024-12-29 DIAGNOSIS — E785 Hyperlipidemia, unspecified: Secondary | ICD-10-CM | POA: Insufficient documentation

## 2024-12-29 DIAGNOSIS — I5022 Chronic systolic (congestive) heart failure: Secondary | ICD-10-CM | POA: Diagnosis not present

## 2024-12-29 DIAGNOSIS — Z7982 Long term (current) use of aspirin: Secondary | ICD-10-CM | POA: Insufficient documentation

## 2024-12-29 NOTE — Addendum Note (Signed)
 Encounter addended by: Arly Salminen B, RN on: 12/29/2024 11:08 AM  Actions taken: Pend clinical note, Clinical Note Signed, Order list changed, Diagnosis association updated

## 2024-12-29 NOTE — Patient Instructions (Signed)
 Medication Changes:  No Changes In Medications at this time.   Testing/Procedures:  ECHOCARDIOGRAM AS SCHEDULED NEXT WEEK   Follow-Up in: 6 MONTHS WITH DR. CHERRIE PLEASE CALL OUR OFFICE AROUND MAY 2026 TO GET SCHEDULED FOR YOUR APPOINTMENT. PHONE NUMBER IS [phone] OPTION 2   At [facility], you and your health needs are our priority. We have a designated team specialized in the treatment of Heart Failure. This Care Team includes your primary Heart Failure Specialized Cardiologist (physician), Advanced Practice Providers (APPs- Physician Assistants and Nurse Practitioners), and Pharmacist who all work together to provide you with the care you need, when you need it.   You may see any of the following providers on your designated Care Team at your next follow up:  Dr. Toribio Cherrie Dr. Ezra Shuck Dr. Odis Brownie Greig Mosses, NP Caffie Shed, [place] [facility] [place], [place] Beckey Coe, NP Jordan Lee, NP Tinnie Redman, PharmD   Please be sure to bring in all your medications bottles to every appointment.   Need to Contact Us :  If you have any questions or concerns before your next appointment please send us  a message through [place] or call our office at [phone].    TO LEAVE A MESSAGE FOR THE NURSE SELECT OPTION 2, PLEASE LEAVE A MESSAGE INCLUDING: YOUR NAME DATE OF BIRTH CALL BACK NUMBER REASON FOR CALL**this is important as we prioritize the call backs  YOU WILL RECEIVE A CALL BACK THE SAME DAY AS LONG AS YOU CALL BEFORE 4:00 PM

## 2024-12-29 NOTE — Progress Notes (Signed)
 "  ADVANCED HF CLINIC NOTE   Primary Care: Anice Wilbert BIRCH, NP HF Cardiologist: Dr. Cherrie  HPI: Mr Veldhuizen is a 59 y.o. with HTN, HL, and systolic heart failure due to NICM.  Admitted 5/23 with new acute CHF. Echo EF 25-30%, RV normal, Grade II DD. R/LHC showed with mild nonobstructive CAD, low filling pressures with moderate to severely reduced CO. cMRI: LVEF 21%, RVEF 35%, mid-myocardial LGE basal septum and mid inferior wall. Felt CM possible prior viral myocarditis vs cardiac sarcoidosis.GDMT started, mexiletine added for PVC suppression. He was discharged home, weight 244.6 lbs.  PET scan 1/24 at [facility] EF 42% no sarcoid   Has been on mexilitene for PVC suppression Zio 7/24 -> 39.6% PVcs. Was seen by Dr. Cindie 07/30/23 switched to amio. Follow up Zio 12/24 PVC burden 18.4% (2 morphologies 18.3% and 0.1% respectively).  Repeat echo 2/25 with EF 45-50% and G1DD.  Saw Charlies Arthur in 12/24 in EP Clinic. Zio repeated. PVC improved. Continue amiodarone .   Saw Dr. Kennyth in 11/25 scheduled for PVC ablation later this month   He returns today for heart failure follow up. Feels ok. Doesn't feel PVCs much (in bigeminy today) No CP or SOB   Cardiac Studies: - Echo (2/25): EF 45-50%, G1DD, RV nl - PET scan (1/24) at [facility] EF 42% no sarcoid - cMRI (5/23):  LVEF 21%, RVEF 35%, mid-myocardial LGE basal septum and mid inferior wall.  - R/LHC (5/23): minimal CAD. RA 2, PA 620/12 (15), PCW 5, CO/CI (fick) 4.1/1.9, PVR 2.4 - Echo (5/23): EF 25-30%, RV okay.  ROS: All systems reviewed and negative except as per HPI.   Past Medical History:  Diagnosis Date   Arthritis    Dry skin    on chest using goldbond lotion on   Hypertension    OA (osteoarthritis)    Sciatic nerve pain right comes and goes   Current Outpatient Medications  Medication Sig Dispense Refill   acetaminophen  (TYLENOL ) 325 MG tablet Take 325 mg by mouth every 6 (six) hours as needed for moderate pain or headache.      amLODipine  (NORVASC ) 5 MG tablet TAKE 1 TABLET (5 MG TOTAL) BY MOUTH DAILY. 90 tablet 3   aspirin  EC 81 MG tablet daily.     atorvastatin  (LIPITOR) 40 MG tablet TAKE 1 TABLET DAILY 90 tablet 3   carvedilol  (COREG ) 12.5 MG tablet TAKE 1 TABLET (12.5MG  TOTAL) BY MOUTH TWICE A DAY WITH MEALS 180 tablet 3   diphenhydrAMINE  (BENADRYL ) 25 MG tablet Take 25 mg by mouth every 6 (six) hours as needed for allergies.     eplerenone  (INSPRA ) 25 MG tablet TAKE 1 TABLET (25 MG TOTAL) BY MOUTH DAILY. 30 tablet 11   FARXIGA  10 MG TABS tablet TAKE 1 TABLET BY MOUTH EVERY DAY 90 tablet 4   furosemide  (LASIX ) 20 MG tablet TAKE 1 TABLET EVERY DAY AS NEEDED FOR EDEMA OR FLUID 30 tablet 2   Hypromellose (ALZAIR ALLERGY  NASAL SPRAY NA) Place 1 spray into both nostrils at bedtime.     methimazole (TAPAZOLE) 10 MG tablet Take 10 mg by mouth daily.     sacubitril -valsartan  (ENTRESTO ) 97-103 MG Take 1 tablet by mouth 2 (two) times daily. 180 tablet 3   sildenafil  (VIAGRA ) 100 MG tablet TAKE 1 TABLET (100 MG TOTAL) BY MOUTH DAILY AS NEEDED. TAKE 1/2 TABLET (50MG ) TO 1 WHOLE TABLET (100MG ) DAILY AS NEEDED 20 tablet 3   tirzepatide  (ZEPBOUND ) 15 MG/0.5ML Pen Inject 15 mg into  the skin once a week. 2 mL 6   No current facility-administered medications for this encounter.   Allergies  Allergen Reactions   Spironolactone  Other (See Comments)    gynecomastia    Social History   Socioeconomic History   Marital status: Divorced    Spouse name: Not on file   Number of children: Not on file   Years of education: Not on file   Highest education level: Not on file  Occupational History   Not on file  Tobacco Use   Smoking status: Former    Current packs/day: 0.00    Average packs/day: 0.5 packs/day for 37.0 years (18.5 ttl pk-yrs)    Types: Cigarettes    Quit date: 2022    Years since quitting: 4.0   Smokeless tobacco: Never  Vaping Use   Vaping status: Never Used  Substance and Sexual Activity   Alcohol use:  Never   Drug use: Never   Sexual activity: Not on file  Other Topics Concern   Not on file  Social History Narrative   Not on file   Social Drivers of Health   Tobacco Use: Medium Risk (11/19/2024)   Patient History    Smoking Tobacco Use: Former    Smokeless Tobacco Use: Never    Passive Exposure: Not on file  Financial Resource Strain: Medium Risk (05/31/2022)   Overall Financial Resource Strain (CARDIA)    Difficulty of Paying Living Expenses: Somewhat hard  Food Insecurity: No Food Insecurity (05/31/2022)   Hunger Vital Sign    Worried About Running Out of Food in the Last Year: Never true    Ran Out of Food in the Last Year: Never true  Transportation Needs: Not on file  Physical Activity: Not on file  Stress: Not on file  Social Connections: Not on file  Intimate Partner Violence: Not on file  Depression (EYV7-0): Not on file  Alcohol Screen: Not on file  Housing: Not on file  Utilities: Not on file  Health Literacy: Not on file   Family History  Problem Relation Age of Onset   Heart failure Father    Heart attack Brother    BP 122/70   Pulse (!) 39   Wt 106.4 kg (234 lb 9.6 oz)   SpO2 98%   BMI 35.67 kg/m   Wt Readings from Last 3 Encounters:  12/29/24 106.4 kg (234 lb 9.6 oz)  11/19/24 105 kg (231 lb 6.4 oz)  10/07/24 103.9 kg (229 lb)   PHYSICAL EXAM: General:  Sitting up in bed. No resp difficulty HEENT: normal Neck: supple. no JVD.  Cor: Regular rate & rhythm. No rubs, gallops or murmurs. Lungs: clear Abdomen: soft, nontender, nondistended.Good bowel sounds. Extremities: no cyanosis, clubbing, rash, edema Neuro: alert & orientedx3, cranial nerves grossly intact. moves all 4 extremities w/o difficulty. Affect pleasant  ECG: SR 80 with frequent PVCs in bigeminal pattern Personally reviewed   ASSESSMENT & PLAN:  Chronic Systolic Heart Failure: - Echo (5/23): EF 25-30%, RV okay. Nonischemic cardiomyopathy.  - R/LHC (5/23): with mild nonobstructive  CAD, low filling pressures with moderate to severely reduced CO.  - Cardiac MRI (5/23): LVEF 21%, RVEF 35%, mid-myocardial LGE basal septum and mid inferior wall, ECV elevated mid inferoseptal (43%) and severely elevated basal inferoseptal (63%) possible sarcoidosis - Echo (9/23): EF 40-45%, RV okay - PET scan (1/24) at [facility] EF 42% no sarcoid. /. If MRI related to myocarditis  - Echo (2/25): EF 45-50%, G1DD, RV nl - scheduled  for PVC ablation 01/16/25. Will repeat echo before  - Stable NYHA I. Volume ok - Continue Entresto  to 97/103 mg bid.  - Continue Lasix  20 mg daily PRN, has not needed - Continue eplerenone  25 daily (had gynecomastia with spiro)  - Continue Farxiga  10 mg daily.  - Continue carvedilol  12.5 mg BID   2. HTN  - Blood pressure well controlled. Continue current regimen.  3. Tobacco Abuse - No longer smoking    5. PVCs/NSVT  - Multifocal PVCs.  - Has been on mexilitene for PVC suppression Zio 7/24 -> 39.6% PVcs. Was seen by Dr. Cindie 07/30/23 switched to amio  - Still with frequent PVCs despite amio. Monitor 12/24 18.4% PVCs - Now off amio. Pending PVVC abaltion this month  6. Non-obstructive CAD - Very mild.  - No s/s angina - continue atorvastatin  40 mg daily  7. OSA - Severe, AHI 44/hr (6/23). - Has CPAP, does not wear it often. Does not tolerate well. - Hopefully will improve with CPAP if not can consider Remede device  8. Morbid obesity  -Body mass index is 35.67 kg/m. - On zepbound . Has lost ~30lbs since April.   Toribio Fuel, MD  10:43 AM  "

## 2024-12-31 ENCOUNTER — Telehealth (HOSPITAL_COMMUNITY): Payer: Self-pay

## 2024-12-31 ENCOUNTER — Encounter (HOSPITAL_COMMUNITY): Payer: Self-pay

## 2024-12-31 LAB — BASIC METABOLIC PANEL WITH GFR
BUN/Creatinine Ratio: 8 — ABNORMAL LOW (ref 9–20)
BUN: 10 mg/dL (ref 6–24)
CO2: 22 mmol/L (ref 20–29)
Calcium: 9.1 mg/dL (ref 8.7–10.2)
Chloride: 103 mmol/L (ref 96–106)
Creatinine, Ser: 1.31 mg/dL — ABNORMAL HIGH (ref 0.76–1.27)
Glucose: 82 mg/dL (ref 70–99)
Potassium: 4.6 mmol/L (ref 3.5–5.2)
Sodium: 139 mmol/L (ref 134–144)
eGFR: 63 mL/min/1.73

## 2024-12-31 LAB — CBC
Hematocrit: 47.9 % (ref 37.5–51.0)
Hemoglobin: 15 g/dL (ref 13.0–17.7)
MCH: 25.7 pg — ABNORMAL LOW (ref 26.6–33.0)
MCHC: 31.3 g/dL — ABNORMAL LOW (ref 31.5–35.7)
MCV: 82 fL (ref 79–97)
Platelets: 192 x10E3/uL (ref 150–450)
RBC: 5.83 x10E6/uL — ABNORMAL HIGH (ref 4.14–5.80)
RDW: 15.6 % — ABNORMAL HIGH (ref 11.6–15.4)
WBC: 8.4 x10E3/uL (ref 3.4–10.8)

## 2024-12-31 NOTE — Telephone Encounter (Signed)
 Attempted to reach patient to discuss upcoming procedure, no answer. Left VM for patient to return call.

## 2024-12-31 NOTE — Telephone Encounter (Signed)
 Spoke with patient to discuss upcoming procedure.   Labs: completed.   Any recent signs of acute illness or been started on antibiotics? No Any new medications started?No  Any medications to hold?  Yes.  HOLD: Tirzepatide  (Mounjaro, Zepbound ) for 1 week prior to the procedure. Last dose on or before Thursday, January 15.  HOLD Carvedilol  for 3 days prior to your procedure. Last dose on January 20.  Update: Only hold Dapagliflozin  (Farxiga ) the morning of procedure.   Medication instructions:  On the morning of your procedure DO NOT take any medication or the procedure may be rescheduled. Nothing to eat or drink after midnight prior to your procedure.  Confirmed patient is scheduled for PVC Ablation on Friday, January 23 with Dr. Kennyth. Instructed patient to arrive at the Main Entrance A at [facility]: [address] [place], [place] [address] and check in at Admitting at 9:30 AM.   Plan to go home the same day, you will only stay overnight if medically necessary. You MUST have a responsible adult to drive you home and MUST be with you the first 24 hours after you arrive home or your procedure could be cancelled.  Informed patient a nurse will call a day before the procedure to confirm arrival time and ensure instructions are followed.  Patient verbalized understanding to all instructions provided and agreed to proceed with procedure.   Advised patient to contact RN Navigator at [phone], to inform of any new medications started after call or concerns prior to procedure.

## 2025-01-03 ENCOUNTER — Ambulatory Visit: Payer: Self-pay | Admitting: Cardiology

## 2025-01-06 ENCOUNTER — Ambulatory Visit (HOSPITAL_COMMUNITY)
Admission: RE | Admit: 2025-01-06 | Discharge: 2025-01-06 | Disposition: A | Source: Ambulatory Visit | Attending: Internal Medicine | Admitting: Internal Medicine

## 2025-01-06 DIAGNOSIS — I110 Hypertensive heart disease with heart failure: Secondary | ICD-10-CM | POA: Insufficient documentation

## 2025-01-06 DIAGNOSIS — I5022 Chronic systolic (congestive) heart failure: Secondary | ICD-10-CM | POA: Insufficient documentation

## 2025-01-06 DIAGNOSIS — E785 Hyperlipidemia, unspecified: Secondary | ICD-10-CM | POA: Insufficient documentation

## 2025-01-06 LAB — ECHOCARDIOGRAM COMPLETE
Area-P 1/2: 2.56 cm2
Calc EF: 50.3 %
S' Lateral: 4.7 cm
Single Plane A2C EF: 54.1 %
Single Plane A4C EF: 45.9 %

## 2025-01-11 ENCOUNTER — Other Ambulatory Visit (HOSPITAL_COMMUNITY): Payer: Self-pay | Admitting: Internal Medicine

## 2025-01-15 NOTE — Pre-Procedure Instructions (Signed)
Instructed patient on the following items: Arrival time 0930 Nothing to eat or drink after midnight No meds AM of procedure Responsible person to drive you home and stay with you for 24 hrs      

## 2025-01-16 ENCOUNTER — Ambulatory Visit (HOSPITAL_COMMUNITY): Admitting: Anesthesiology

## 2025-01-16 ENCOUNTER — Ambulatory Visit (HOSPITAL_COMMUNITY)
Admission: RE | Admit: 2025-01-16 | Discharge: 2025-01-16 | Disposition: A | Discharge status: Home / Self Care | Attending: Cardiology | Admitting: Cardiology

## 2025-01-16 ENCOUNTER — Encounter (HOSPITAL_COMMUNITY)
Admission: RE | Disposition: A | Discharge status: Home / Self Care | Payer: Self-pay | Source: Home / Self Care | Attending: Cardiology

## 2025-01-16 ENCOUNTER — Other Ambulatory Visit: Payer: Self-pay

## 2025-01-16 DIAGNOSIS — I493 Ventricular premature depolarization: Secondary | ICD-10-CM

## 2025-01-16 DIAGNOSIS — I4510 Unspecified right bundle-branch block: Secondary | ICD-10-CM | POA: Diagnosis not present

## 2025-01-16 DIAGNOSIS — I5022 Chronic systolic (congestive) heart failure: Secondary | ICD-10-CM | POA: Insufficient documentation

## 2025-01-16 DIAGNOSIS — Z87891 Personal history of nicotine dependence: Secondary | ICD-10-CM | POA: Diagnosis not present

## 2025-01-16 DIAGNOSIS — Z7982 Long term (current) use of aspirin: Secondary | ICD-10-CM | POA: Diagnosis not present

## 2025-01-16 DIAGNOSIS — I110 Hypertensive heart disease with heart failure: Secondary | ICD-10-CM | POA: Insufficient documentation

## 2025-01-16 DIAGNOSIS — I5021 Acute systolic (congestive) heart failure: Secondary | ICD-10-CM

## 2025-01-16 DIAGNOSIS — Z79899 Other long term (current) drug therapy: Secondary | ICD-10-CM | POA: Diagnosis not present

## 2025-01-16 DIAGNOSIS — E0590 Thyrotoxicosis, unspecified without thyrotoxic crisis or storm: Secondary | ICD-10-CM | POA: Insufficient documentation

## 2025-01-16 LAB — POCT ACTIVATED CLOTTING TIME: Activated Clotting Time: 240 s

## 2025-01-16 MED ORDER — SUGAMMADEX SODIUM 200 MG/2ML IV SOLN
INTRAVENOUS | Status: DC | PRN
  Administered 2025-01-16 (×2): 200 mg via INTRAVENOUS

## 2025-01-16 MED ORDER — PHENYLEPHRINE HCL-NACL 20-0.9 MG/250ML-% IV SOLN
INTRAVENOUS | Status: DC | PRN
  Administered 2025-01-16: 25 ug/min via INTRAVENOUS

## 2025-01-16 MED ORDER — ROCURONIUM BROMIDE 10 MG/ML (PF) SYRINGE
PREFILLED_SYRINGE | INTRAVENOUS | Status: DC | PRN
  Administered 2025-01-16 (×3): 20 mg via INTRAVENOUS
  Administered 2025-01-16: 50 mg via INTRAVENOUS
  Administered 2025-01-16: 30 mg via INTRAVENOUS
  Administered 2025-01-16: 20 mg via INTRAVENOUS

## 2025-01-16 MED ORDER — ONDANSETRON HCL 4 MG/2ML IJ SOLN: 4.0000 mg | Freq: Four times a day (QID) | INTRAMUSCULAR | Status: DC | PRN

## 2025-01-16 MED ORDER — PROTAMINE SULFATE 10 MG/ML IV SOLN
INTRAVENOUS | Status: DC | PRN
  Administered 2025-01-16: 40 mg via INTRAVENOUS

## 2025-01-16 MED ORDER — SODIUM CHLORIDE 0.9 % IV SOLN: 250.0000 mL | INTRAVENOUS | Status: DC | PRN

## 2025-01-16 MED ORDER — ASPIRIN 325 MG PO TABS
325.0000 mg | ORAL_TABLET | Freq: Once | ORAL | Status: AC
  Administered 2025-01-16: 325 mg via ORAL
  Filled 2025-01-16: qty 1

## 2025-01-16 MED ORDER — HEPARIN (PORCINE) IN NACL 1000-0.9 UT/500ML-% IV SOLN
INTRAVENOUS | Status: DC | PRN
  Administered 2025-01-16 (×2): 500 mL

## 2025-01-16 MED ORDER — SODIUM CHLORIDE 0.9% FLUSH: 3.0000 mL | INTRAVENOUS | Status: DC | PRN

## 2025-01-16 MED ORDER — CALCIUM CHLORIDE 10 % IV SOLN
INTRAVENOUS | Status: AC
  Filled 2025-01-16: qty 10

## 2025-01-16 MED ORDER — ACETAMINOPHEN 325 MG PO TABS: 650.0000 mg | ORAL_TABLET | ORAL | Status: DC | PRN

## 2025-01-16 MED ORDER — DEXAMETHASONE SOD PHOSPHATE PF 10 MG/ML IJ SOLN
INTRAMUSCULAR | Status: DC | PRN
  Administered 2025-01-16: 10 mg via INTRAVENOUS

## 2025-01-16 MED ORDER — CARVEDILOL 12.5 MG PO TABS
12.5000 mg | ORAL_TABLET | Freq: Once | ORAL | Status: AC
  Administered 2025-01-16: 12.5 mg via ORAL
  Filled 2025-01-16: qty 1

## 2025-01-16 MED ORDER — HEPARIN SODIUM (PORCINE) 1000 UNIT/ML IJ SOLN
INTRAMUSCULAR | Status: AC
  Filled 2025-01-16: qty 1

## 2025-01-16 MED ORDER — SODIUM CHLORIDE 0.9 % IV SOLN: INTRAVENOUS | Status: DC

## 2025-01-16 MED ORDER — NOREPINEPHRINE 4 MG/250ML-% IV SOLN
INTRAVENOUS | Status: AC
  Filled 2025-01-16: qty 250

## 2025-01-16 MED ORDER — HEPARIN SODIUM (PORCINE) 1000 UNIT/ML IJ SOLN
INTRAMUSCULAR | Status: AC
  Filled 2025-01-16: qty 10

## 2025-01-16 MED ORDER — PROPOFOL 500 MG/50ML IV EMUL
INTRAVENOUS | Status: DC | PRN
  Administered 2025-01-16: 125 ug/kg/min via INTRAVENOUS

## 2025-01-16 MED ORDER — FENTANYL CITRATE (PF) 250 MCG/5ML IJ SOLN
INTRAMUSCULAR | Status: DC | PRN
  Administered 2025-01-16: 100 ug via INTRAVENOUS

## 2025-01-16 MED ORDER — CALCIUM CHLORIDE 10 % IV SOLN
INTRAVENOUS | Status: DC | PRN
  Administered 2025-01-16: 1 g via INTRAVENOUS

## 2025-01-16 MED ORDER — PROPOFOL 1000 MG/100ML IV EMUL
INTRAVENOUS | Status: AC
  Filled 2025-01-16: qty 100

## 2025-01-16 MED ORDER — SODIUM CHLORIDE 0.9% FLUSH: 3.0000 mL | Freq: Two times a day (BID) | INTRAVENOUS | Status: DC

## 2025-01-16 MED ORDER — ONDANSETRON HCL 4 MG/2ML IJ SOLN
INTRAMUSCULAR | Status: DC | PRN
  Administered 2025-01-16: 4 mg via INTRAVENOUS

## 2025-01-16 MED ORDER — ATROPINE SULFATE 1 MG/10ML IJ SOSY
PREFILLED_SYRINGE | INTRAMUSCULAR | Status: AC
  Filled 2025-01-16: qty 10

## 2025-01-16 MED ORDER — HEPARIN SODIUM (PORCINE) 1000 UNIT/ML IJ SOLN
INTRAMUSCULAR | Status: DC | PRN
  Administered 2025-01-16: 15000 [IU] via INTRAVENOUS
  Administered 2025-01-16: 4000 [IU] via INTRAVENOUS
  Administered 2025-01-16: 5000 [IU] via INTRAVENOUS

## 2025-01-16 MED ORDER — BUPIVACAINE HCL (PF) 0.25 % IJ SOLN
INTRAMUSCULAR | Status: AC
  Filled 2025-01-16: qty 60

## 2025-01-16 MED ORDER — PROPOFOL 10 MG/ML IV BOLUS
INTRAVENOUS | Status: DC | PRN
  Administered 2025-01-16: 130 mg via INTRAVENOUS

## 2025-01-16 MED ORDER — VASOPRESSIN 20 UNIT/ML IV SOLN
INTRAVENOUS | Status: DC | PRN
  Administered 2025-01-16: 1 [IU] via INTRAVENOUS

## 2025-01-16 MED ORDER — HEPARIN SODIUM (PORCINE) 1000 UNIT/ML IJ SOLN
INTRAMUSCULAR | Status: DC | PRN
  Administered 2025-01-16: 1000 [IU] via INTRAVENOUS

## 2025-01-16 MED ORDER — NOREPINEPHRINE 4 MG/250ML-% IV SOLN
INTRAVENOUS | Status: DC | PRN
  Administered 2025-01-16: 1 ug/min via INTRAVENOUS

## 2025-01-16 MED ORDER — LIDOCAINE 2% (20 MG/ML) 5 ML SYRINGE
INTRAMUSCULAR | Status: DC | PRN
  Administered 2025-01-16: 40 mg via INTRAVENOUS

## 2025-01-16 NOTE — H&P (Signed)
 " Electrophysiology Note:   Date:  01/16/25  ID:  Alan Coffey, DOB 1966/10/03, MRN [id number]   Primary Cardiologist: None Electrophysiologist: Fonda Kitty, MD       History of Present Illness:   Alan Coffey is a 59 y.o. male with h/o frequent PVCs and chronic systolic heart failure who is being seen today for EP follow up.   Discussed the use of AI scribe software for clinical note transcription with the patient, who gave verbal consent to proceed.   History of Present Illness Alan Coffey is a 59 year old male with PVCs and thyroid  issues who presents for evaluation of persistent PVCs and medication management.   He has been experiencing frequent premature ventricular contractions (PVCs), with a recent monitor showing a PVC burden of over thirty percent. He has been on medication to suppress the PVCs, but this has led to thyroid  issues.   He is currently taking methimazole to manage his thyroid  condition, which started acting up as a result of the medication used to suppress the PVCs. His thyroid  levels were last checked in August, and they have started to improve slightly since starting methimazole. He has a follow-up appointment scheduled for next month to reassess his thyroid  function.   He is concerned about his medication coverage, specifically Entresto , as his insurance will not cover it next year. He has chronic systolic heart failure and has been on this medication. He has tried various insurance options without success and is unable to use manufacturer assistance due to having Medicare.   He is currently taking a beta blocker.   Interval: Patient presents today for planned PVC ablation. Reports feeling relatively well. No new or acute complaints.   Review of systems complete and found to be negative unless listed in HPI.    EP Information / Studies Reviewed:        EKG 08/21/24: SR with PVCs    Zio 11/05/24: HR 72 to 218, average 86. 310  nonsustained VT episodes, longest 9 beats. Occasional supraventricular ectopy, 3% Frequent ventricular ectopy, 32.7% No sustained arrhythmias. No atrial fibrillation.   Echo 02/13/24:  1. Left ventricular ejection fraction, by estimation, is 45 to 50%. The  left ventricle has mildly decreased function. The left ventricle  demonstrates global hypokinesis. The left ventricular internal cavity size  was mildly dilated. Left ventricular  diastolic parameters are consistent with Grade I diastolic dysfunction  (impaired relaxation).   2. Right ventricular systolic function is normal. The right ventricular  size is normal. There is normal pulmonary artery systolic pressure. The  estimated right ventricular systolic pressure is 22.9 mmHg.   3. Right atrial size was mildly dilated.   4. The mitral valve is normal in structure. No evidence of mitral valve  regurgitation. No evidence of mitral stenosis.   5. The aortic valve is tricuspid. Aortic valve regurgitation is not  visualized. No aortic stenosis is present.   6. Aortic dilatation noted. There is mild dilatation of the ascending  aorta, measuring 38 mm.   7. The inferior vena cava is normal in size with greater than 50%  respiratory variability, suggesting right atrial pressure of 3 mmHg.    Cardiac MRI 05/12/22: FINDINGS: 1. Moderately increased left ventricular size, with LVEDD 69 mm, but LVEDVi 124 mL/m2. Mild concentric hypertrophy, with intraventricular septal thickness of 8 mm, posterior wall thickness of 7 mm, but myocardial mass index of 89 g/m2. Severe left ventricular systolic dysfunction (LVEF =21%). Paradoxical septal motion and  global hypokinesis. No LV thrombus. GLS -4.8% Left ventricular parametric mapping notable for normal T2. ECV is elevated in the mid inferoseptal (43%) and severely elevated in the basal inferoseptal (63%). There is late gadolinium enhancement in the left ventricular myocardium: There is mid  myocardial and patch LGE in the mid inferior, and basal septal (anterior and inferior, predominantly mid myocardial but transmural in the basal anteroseptal).   2. Normal right ventricular size with RVEDVI 83 mL/m2. Normal right ventricular thickness. Moderately decrease right ventricular systolic function (RVEF =35%). Septal hypokinesis.   3.  Normal left and right atrial size.   4. Normal size of the aortic root, ascending aorta and pulmonary artery.   5. Valve assessment: Aortic Valve: Tri-leaflet aortic valve. Mild aortic regurgitation, regurgitation fraction 3%. Mean gradient 2 mm Hg. Pulmonic Valve: Qualitatively, there is no significant regurgitation. Tricuspid Valve: Qualitatively, there is mild central regurgitation. Mitral Valve: Mild to moderate ventricular functional central mitral regurgitation. Regurgitant volume 12 mL, regurgitant fraction 21%.   6.  Normal pericardium.  No pericardial effusion.   7. Grossly, no extracardiac findings. Recommended dedicated study if concerned for non-cardiac pathology.   8. Patient motion artifacts noted. This decreased the sensitivity of ventricular volumes and valve assessment.       Physical Exam:    Today's Vitals   01/16/25 0950  BP: 124/67  Pulse: 68  Resp: 18  Temp: 98 F (36.7 C)  TempSrc: Oral  SpO2: 97%  Weight: 102.5 kg  Height: 5' 8 (1.727 m)   Body mass index is 34.36 kg/m.   GEN: Well nourished, well developed in no acute distress NECK: No JVD CARDIAC: Normal rate, irregular rhythm RESPIRATORY:  Clear to auscultation without rales, wheezing or rhonchi  ABDOMEN: Soft, non-distended EXTREMITIES: Trace edema; No deformity    ASSESSMENT AND PLAN:     #Frequent PVCs: RBBB, positive concordance, left inferior axis.  Suspicion is for mitral annulus or anterolateral papillary muscle. #High risk medication use: Off amiodarone  and taking methimazole for hyperthyroidism. #Hyperthyroidism: Continue  follow-up with PCP for management. -LVEF 45-50%.  NYHA class II symptoms.  Has tried mexiletine previously without adequate suppression.  Hyperthyroidism complicated amiodarone  use.  Continues to have high burden and symptoms.  His only other medication option appears to be sotalol, but has had significantly low EF in the past.  We discussed role of catheter ablation in efforts to reduce PVC burden and potentially improve/stabilize LVEF. Risk, benefits, and alternatives to EP study and radiofrequency ablation for PVCs were also discussed in detail today. These risks include but are not limited to complete heart block, stroke, bleeding, vascular damage, tamponade, perforation, and death. The patient understands these risk and wishes to proceed today.   Follow up with Dr. Kennyth 3 months after catheter ablation.    Signed, Fonda Kennyth, MD      "

## 2025-01-16 NOTE — Transfer of Care (Signed)
 Immediate Anesthesia Transfer of Care Note  Patient: Alan Coffey  Procedure(s) Performed: PVC ABLATION  Patient Location: PACU  Anesthesia Type:General  Level of Consciousness: awake, alert , and oriented  Airway & Oxygen Therapy: Patient Spontanous Breathing and Patient connected to nasal cannula oxygen  Post-op Assessment: Report given to RN and Post -op Vital signs reviewed and stable  Post vital signs: Reviewed and stable  Last Vitals:  Vitals Value Taken Time  BP 96/56 01/16/25 17:15  Temp 36.8 C 01/16/25 17:10  Pulse 79 01/16/25 17:16  Resp 14 01/16/25 17:16  SpO2 94 % 01/16/25 17:16  Vitals shown include unfiled device data.  Last Pain:  Vitals:   01/16/25 1710  TempSrc: Oral  PainSc: 0-No pain         Complications: There were no known notable events for this encounter.

## 2025-01-16 NOTE — Progress Notes (Addendum)
 Patient and patient daughter given discharge instructions, education provided no further questions at this time. Patient able to ambulate and void before discharge. Able to tolerate PO intake. Patient site is clean, dry, intact with no hematoma noted upon discharge. Seen by MD verified when to resume 81 mg aspirin . Patient is asymptomatic.

## 2025-01-16 NOTE — Progress Notes (Addendum)
 Called MD parker to make aware of patient rhythm, patient has frequent PVC's as well as some trigeminy going on. 12.5 mg of coreg  (home medication ordered) per verbal order. States he is still okay with pt d/c at this time.

## 2025-01-16 NOTE — Anesthesia Preprocedure Evaluation (Addendum)
"                                    Anesthesia Evaluation  Patient identified by MRN, date of birth, ID band Patient awake    Reviewed: Allergy  & Precautions, NPO status , Patient's Chart, lab work & pertinent test results, reviewed documented beta blocker date and time   History of Anesthesia Complications Negative for: history of anesthetic complications  Airway Mallampati: III  TM Distance: >3 FB Neck ROM: Full    Dental  (+) Edentulous Upper, Edentulous Lower, Lower Dentures, Upper Dentures,    Pulmonary Patient abstained from smoking., former smoker   Pulmonary exam normal        Cardiovascular hypertension, Pt. on medications and Pt. on home beta blockers +CHF   Rhythm:Irregular  Echo 12/2024  1. Left ventricular ejection fraction, by estimation, is 40 to 45%. The  left ventricle has mildly decreased function. The left ventricle  demonstrates global hypokinesis. The left ventricular internal cavity size  was mildly dilated. There is mild left  ventricular hypertrophy. Left ventricular diastolic parameters are  consistent with Grade I diastolic dysfunction (impaired relaxation).   2. Right ventricular systolic function is normal. The right ventricular  size is normal. Tricuspid regurgitation signal is inadequate for assessing  PA pressure.   3. The mitral valve is grossly normal. Trivial mitral valve  regurgitation. No evidence of mitral stenosis.   4. The aortic valve is tricuspid. Aortic valve regurgitation is not  visualized. No aortic stenosis is present.   5. The inferior vena cava is normal in size with greater than 50%  respiratory variability, suggesting right atrial pressure of 3 mmHg.   Cath 04/2022 Assessment: 1. Very mild non-obstructive CAD 2. Severe NICM EF ~25% 3. 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 Hold diuretics. Hydrate gently. Start mexilitene. cMRI  tomorrow.     Neuro/Psych negative neurological ROS  negative psych ROS   GI/Hepatic negative GI ROS, Neg liver ROS,,,  Endo/Other  BMI 38  Renal/GU negative Renal ROS     Musculoskeletal  (+) Arthritis ,    Abdominal   Peds  Hematology negative hematology ROS (+)   Anesthesia Other Findings   Reproductive/Obstetrics negative OB ROS                              Anesthesia Physical Anesthesia Plan  ASA: 3  Anesthesia Plan: General   Post-op Pain Management:    Induction: Intravenous  PONV Risk Score and Plan: 2 and Treatment may vary due to age or medical condition, Ondansetron  and Dexamethasone   Airway Management Planned: Oral ETT  Additional Equipment: None  Intra-op Plan:   Post-operative Plan: Extubation in OR  Informed Consent: I have reviewed the patients History and Physical, chart, labs and discussed the procedure including the risks, benefits and alternatives for the proposed anesthesia with the patient or authorized representative who has indicated his/her understanding and acceptance.     Dental advisory given  Plan Discussed with: CRNA  Anesthesia Plan Comments:          Anesthesia Quick Evaluation  "

## 2025-01-16 NOTE — Discharge Instructions (Signed)

## 2025-01-17 NOTE — Anesthesia Postprocedure Evaluation (Signed)
"   Anesthesia Post Note  Patient: Alan Coffey  Procedure(s) Performed: PVC ABLATION     Patient location during evaluation: PACU Anesthesia Type: General Level of consciousness: awake and alert Pain management: pain level controlled Vital Signs Assessment: post-procedure vital signs reviewed and stable Respiratory status: spontaneous breathing, nonlabored ventilation, respiratory function stable and patient connected to nasal cannula oxygen Cardiovascular status: blood pressure returned to baseline and stable Postop Assessment: no apparent nausea or vomiting Anesthetic complications: no   There were no known notable events for this encounter.  Last Vitals:  Vitals:   01/16/25 1955 01/16/25 2000  BP: 123/76 130/76  Pulse: 80 (!) 59  Resp: 15 13  Temp:    SpO2: 96% 96%    Last Pain:  Vitals:   01/16/25 1808  TempSrc:   PainSc: 0-No pain                 Cordella P Ercil Cassis      "

## 2025-01-18 ENCOUNTER — Encounter (HOSPITAL_COMMUNITY): Payer: Self-pay | Admitting: Cardiology

## 2025-01-18 ENCOUNTER — Other Ambulatory Visit (HOSPITAL_COMMUNITY): Payer: Self-pay | Admitting: Internal Medicine

## 2025-01-19 ENCOUNTER — Telehealth (HOSPITAL_COMMUNITY): Payer: Self-pay

## 2025-01-19 ENCOUNTER — Other Ambulatory Visit (HOSPITAL_COMMUNITY): Payer: Self-pay

## 2025-01-19 NOTE — Telephone Encounter (Signed)
 Spoke with patient to complete post procedure follow up call.  Patient reports no complications with groin sites.   Instructions reviewed with patient:  It is normal to have bruising, tenderness, mild swelling, and a pea or marble sized lump/knot at the groin site which can take up to three months to resolve.  Get help right away if you notice sudden swelling at the puncture site.  Check your puncture site every day for signs of infection: fever, redness, swelling, pus drainage, warmth, foul odor or excessive pain. If this occurs, please call [phone], to speak with the RN Navigator. Get help right away if your puncture site is bleeding and the bleeding does not stop after applying firm pressure to the area.  You may continue to have skipped beats during the first several months after your procedure.  You will follow up with the APP 1 month after your procedure.  Activity restrictions reviewed.  Patient verbalized understanding to all instructions provided.

## 2025-01-19 NOTE — Telephone Encounter (Signed)
 Advanced Heart Failure Patient Advocate Encounter  The patient was renewed for a [employer] grant that will help cover the cost of Carvedilol , Entresto , Eplerenone , Farxiga .  Total amount awarded, $7,500.  Effective: 12/20/2024 - 12/19/2025.  BIN [id number] PCN [id number] [id number] ID [id number]  Pharmacy provided with approval and processing information. Patient informed via MyChart.  Rachel DEL, CPhT Rx Patient Advocate Phone: [phone]

## 2025-01-20 MED FILL — Atropine Sulfate Soln Prefill Syr 1 MG/10ML (0.1 MG/ML): INTRAMUSCULAR | Qty: 10 | Status: AC

## 2025-01-20 MED FILL — Propofol IV Emul 1000 MG/100ML (10 MG/ML): INTRAVENOUS | Qty: 50 | Status: AC

## 2025-01-20 MED FILL — Bupivacaine HCl Preservative Free (PF) Inj 0.25%: INTRAMUSCULAR | Qty: 30 | Status: AC

## 2025-02-16 ENCOUNTER — Ambulatory Visit: Admitting: Student
